# Patient Record
Sex: Female | Born: 1965 | Race: Black or African American | Hispanic: No | Marital: Married | Smoking: Never smoker
Health system: Southern US, Community
[De-identification: ages and names within clinical notes are randomized; demographics above are authoritative.]

## PROBLEM LIST (undated history)

## (undated) DIAGNOSIS — G8929 Other chronic pain: Secondary | ICD-10-CM

## (undated) DIAGNOSIS — R0683 Snoring: Secondary | ICD-10-CM

## (undated) DIAGNOSIS — R51 Headache: Secondary | ICD-10-CM

## (undated) DIAGNOSIS — M5430 Sciatica, unspecified side: Secondary | ICD-10-CM

## (undated) DIAGNOSIS — M1711 Unilateral primary osteoarthritis, right knee: Secondary | ICD-10-CM

## (undated) DIAGNOSIS — H579 Unspecified disorder of eye and adnexa: Secondary | ICD-10-CM

## (undated) DIAGNOSIS — M719 Bursopathy, unspecified: Secondary | ICD-10-CM

## (undated) DIAGNOSIS — T148XXA Other injury of unspecified body region, initial encounter: Secondary | ICD-10-CM

## (undated) DIAGNOSIS — M1990 Unspecified osteoarthritis, unspecified site: Secondary | ICD-10-CM

## (undated) DIAGNOSIS — M779 Enthesopathy, unspecified: Secondary | ICD-10-CM

## (undated) DIAGNOSIS — M797 Fibromyalgia: Secondary | ICD-10-CM

## (undated) DIAGNOSIS — G4700 Insomnia, unspecified: Secondary | ICD-10-CM

## (undated) DIAGNOSIS — F329 Major depressive disorder, single episode, unspecified: Secondary | ICD-10-CM

## (undated) DIAGNOSIS — M5136 Other intervertebral disc degeneration, lumbar region: Secondary | ICD-10-CM

## (undated) DIAGNOSIS — E119 Type 2 diabetes mellitus without complications: Secondary | ICD-10-CM

## (undated) DIAGNOSIS — M51369 Other intervertebral disc degeneration, lumbar region without mention of lumbar back pain or lower extremity pain: Secondary | ICD-10-CM

## (undated) DIAGNOSIS — R519 Headache, unspecified: Secondary | ICD-10-CM

## (undated) DIAGNOSIS — G5600 Carpal tunnel syndrome, unspecified upper limb: Secondary | ICD-10-CM

## (undated) DIAGNOSIS — F32A Depression, unspecified: Secondary | ICD-10-CM

## (undated) DIAGNOSIS — S0300XA Dislocation of jaw, unspecified side, initial encounter: Secondary | ICD-10-CM

## (undated) DIAGNOSIS — K5900 Constipation, unspecified: Secondary | ICD-10-CM

## (undated) DIAGNOSIS — F411 Generalized anxiety disorder: Secondary | ICD-10-CM

## (undated) HISTORY — PX: TEMPOROMANDIBULAR JOINT SURGERY: SHX35

## (undated) HISTORY — DX: Bursopathy, unspecified: M71.9

## (undated) HISTORY — DX: Headache: R51

## (undated) HISTORY — DX: Other injury of unspecified body region, initial encounter: T14.8XXA

## (undated) HISTORY — PX: TONSILLECTOMY: SUR1361

## (undated) HISTORY — DX: Unspecified disorder of eye and adnexa: H57.9

## (undated) HISTORY — DX: Dislocation of jaw, unspecified side, initial encounter: S03.00XA

## (undated) HISTORY — DX: Other chronic pain: G89.29

## (undated) HISTORY — PX: ABDOMINAL HYSTERECTOMY: SHX81

## (undated) HISTORY — DX: Major depressive disorder, single episode, unspecified: F32.9

## (undated) HISTORY — DX: Unspecified osteoarthritis, unspecified site: M19.90

## (undated) HISTORY — DX: Other intervertebral disc degeneration, lumbar region without mention of lumbar back pain or lower extremity pain: M51.369

## (undated) HISTORY — DX: Other intervertebral disc degeneration, lumbar region: M51.36

## (undated) HISTORY — DX: Generalized anxiety disorder: F41.1

## (undated) HISTORY — DX: Enthesopathy, unspecified: M77.9

## (undated) HISTORY — DX: Carpal tunnel syndrome, unspecified upper limb: G56.00

## (undated) HISTORY — DX: Snoring: R06.83

## (undated) HISTORY — DX: Type 2 diabetes mellitus without complications: E11.9

## (undated) HISTORY — PX: KNEE SURGERY: SHX244

## (undated) HISTORY — DX: Depression, unspecified: F32.A

## (undated) HISTORY — DX: Insomnia, unspecified: G47.00

## (undated) HISTORY — DX: Headache, unspecified: R51.9

## (undated) HISTORY — PX: DENTAL SURGERY: SHX609

## (undated) HISTORY — DX: Unilateral primary osteoarthritis, right knee: M17.11

## (undated) HISTORY — DX: Sciatica, unspecified side: M54.30

## (undated) HISTORY — PX: OTHER SURGICAL HISTORY: SHX169

## (undated) HISTORY — DX: Constipation, unspecified: K59.00

---

## 1999-01-25 ENCOUNTER — Encounter: Payer: Self-pay | Admitting: Orthopedic Surgery

## 1999-01-25 ENCOUNTER — Ambulatory Visit (HOSPITAL_COMMUNITY): Admission: RE | Admit: 1999-01-25 | Discharge: 1999-01-25 | Payer: Self-pay | Admitting: Orthopedic Surgery

## 1999-06-03 ENCOUNTER — Emergency Department (HOSPITAL_COMMUNITY): Admission: EM | Admit: 1999-06-03 | Discharge: 1999-06-04 | Payer: Self-pay | Admitting: *Deleted

## 1999-06-04 ENCOUNTER — Encounter: Payer: Self-pay | Admitting: Emergency Medicine

## 1999-08-19 ENCOUNTER — Emergency Department (HOSPITAL_COMMUNITY): Admission: EM | Admit: 1999-08-19 | Discharge: 1999-08-19 | Payer: Self-pay | Admitting: Emergency Medicine

## 1999-08-19 ENCOUNTER — Encounter: Payer: Self-pay | Admitting: Emergency Medicine

## 1999-08-29 ENCOUNTER — Emergency Department (HOSPITAL_COMMUNITY): Admission: EM | Admit: 1999-08-29 | Discharge: 1999-08-29 | Payer: Self-pay | Admitting: Emergency Medicine

## 2001-06-16 ENCOUNTER — Encounter: Payer: Self-pay | Admitting: Internal Medicine

## 2001-06-16 ENCOUNTER — Encounter: Admission: RE | Admit: 2001-06-16 | Discharge: 2001-06-16 | Payer: Self-pay | Admitting: Internal Medicine

## 2001-06-16 ENCOUNTER — Other Ambulatory Visit: Admission: RE | Admit: 2001-06-16 | Discharge: 2001-06-16 | Payer: Self-pay | Admitting: Internal Medicine

## 2001-07-04 ENCOUNTER — Encounter: Admission: RE | Admit: 2001-07-04 | Discharge: 2001-10-02 | Payer: Self-pay | Admitting: Internal Medicine

## 2001-10-10 ENCOUNTER — Encounter: Admission: RE | Admit: 2001-10-10 | Discharge: 2002-01-08 | Payer: Self-pay | Admitting: Internal Medicine

## 2003-07-17 ENCOUNTER — Other Ambulatory Visit: Admission: RE | Admit: 2003-07-17 | Discharge: 2003-07-17 | Payer: Self-pay | Admitting: Obstetrics and Gynecology

## 2003-08-03 ENCOUNTER — Encounter: Admission: RE | Admit: 2003-08-03 | Discharge: 2003-08-03 | Payer: Self-pay | Admitting: Obstetrics and Gynecology

## 2008-06-12 ENCOUNTER — Inpatient Hospital Stay (HOSPITAL_COMMUNITY): Admission: EM | Admit: 2008-06-12 | Discharge: 2008-06-14 | Payer: Self-pay

## 2008-06-12 ENCOUNTER — Ambulatory Visit: Payer: Self-pay | Admitting: Cardiology

## 2008-06-12 ENCOUNTER — Encounter: Payer: Self-pay | Admitting: Emergency Medicine

## 2009-03-01 ENCOUNTER — Encounter: Admission: RE | Admit: 2009-03-01 | Discharge: 2009-03-01 | Payer: Self-pay | Admitting: Family Medicine

## 2010-08-05 LAB — CARDIAC PANEL(CRET KIN+CKTOT+MB+TROPI)
CK, MB: 0.6 ng/mL (ref 0.3–4.0)
CK, MB: 0.8 ng/mL (ref 0.3–4.0)
CK, MB: 0.8 ng/mL (ref 0.3–4.0)
Relative Index: 0.5 (ref 0.0–2.5)
Relative Index: 0.5 (ref 0.0–2.5)
Relative Index: 0.7 (ref 0.0–2.5)
Total CK: 115 U/L (ref 7–177)
Total CK: 126 U/L (ref 7–177)
Total CK: 153 U/L (ref 7–177)
Troponin I: <0.01 ng/mL (ref 0.00–0.06)
Troponin I: <0.01 ng/mL (ref 0.00–0.06)
Troponin I: <0.01 ng/mL (ref 0.00–0.06)

## 2010-08-05 LAB — CBC
HCT: 34.6 % — ABNORMAL LOW (ref 36.0–46.0)
HCT: 39.2 % (ref 36.0–46.0)
Hemoglobin: 12 g/dL (ref 12.0–15.0)
Hemoglobin: 13.2 g/dL (ref 12.0–15.0)
MCHC: 33.6 g/dL (ref 30.0–36.0)
MCHC: 34.6 g/dL (ref 30.0–36.0)
MCV: 87 fL (ref 78.0–100.0)
MCV: 87 fL (ref 78.0–100.0)
Platelets: 210 10*3/uL (ref 150–400)
Platelets: 227 10*3/uL (ref 150–400)
RBC: 3.98 MIL/uL (ref 3.87–5.11)
RBC: 4.5 MIL/uL (ref 3.87–5.11)
RDW: 14.1 % (ref 11.5–15.5)
RDW: 14.3 % (ref 11.5–15.5)
WBC: 7.1 10*3/uL (ref 4.0–10.5)
WBC: 9 10*3/uL (ref 4.0–10.5)

## 2010-08-05 LAB — LIPID PANEL
Cholesterol: 176 mg/dL (ref 0–200)
HDL: 42 mg/dL (ref >39)
LDL Cholesterol: 113 mg/dL — ABNORMAL HIGH (ref 0–99)
Total CHOL/HDL Ratio: 4.2 RATIO
Triglycerides: 107 mg/dL (ref <150)
VLDL: 21 mg/dL (ref 0–40)

## 2010-08-05 LAB — POCT I-STAT, CHEM 8
BUN: 11 mg/dL (ref 6–23)
Calcium, Ion: 1.18 mmol/L (ref 1.12–1.32)
Chloride: 106 mEq/L (ref 96–112)
Creatinine, Ser: 1.1 mg/dL (ref 0.4–1.2)
Glucose, Bld: 121 mg/dL — ABNORMAL HIGH (ref 70–99)
HCT: 41 % (ref 36.0–46.0)
Hemoglobin: 13.9 g/dL (ref 12.0–15.0)
Potassium: 3.5 mEq/L (ref 3.5–5.1)
Sodium: 140 mEq/L (ref 135–145)
TCO2: 25 mmol/L (ref 0–100)

## 2010-08-05 LAB — BASIC METABOLIC PANEL
BUN: 11 mg/dL (ref 6–23)
CO2: 26 mEq/L (ref 19–32)
Calcium: 8.9 mg/dL (ref 8.4–10.5)
Chloride: 105 mEq/L (ref 96–112)
Creatinine, Ser: 0.93 mg/dL (ref 0.4–1.2)
GFR calc Af Amer: >60 mL/min (ref >60)
GFR calc non Af Amer: >60 mL/min (ref >60)
Glucose, Bld: 107 mg/dL — ABNORMAL HIGH (ref 70–99)
Potassium: 3.7 mEq/L (ref 3.5–5.1)
Sodium: 138 mEq/L (ref 135–145)

## 2010-08-05 LAB — APTT: aPTT: 40 seconds — ABNORMAL HIGH (ref 24–37)

## 2010-08-05 LAB — CK TOTAL AND CKMB (NOT AT ARMC)
CK, MB: 0.8 ng/mL (ref 0.3–4.0)
Relative Index: 0.7 (ref 0.0–2.5)
Total CK: 119 U/L (ref 7–177)

## 2010-08-05 LAB — PROTIME-INR
INR: 1 (ref 0.00–1.49)
Prothrombin Time: 13.7 seconds (ref 11.6–15.2)

## 2010-08-05 LAB — DIFFERENTIAL
Basophils Absolute: 0 10*3/uL (ref 0.0–0.1)
Basophils Relative: 0 % (ref 0–1)
Eosinophils Absolute: 0.2 10*3/uL (ref 0.0–0.7)
Eosinophils Relative: 2 % (ref 0–5)
Lymphocytes Relative: 31 % (ref 12–46)
Lymphs Abs: 2.8 10*3/uL (ref 0.7–4.0)
Monocytes Absolute: 0.5 10*3/uL (ref 0.1–1.0)
Monocytes Relative: 6 % (ref 3–12)
Neutro Abs: 5.5 10*3/uL (ref 1.7–7.7)
Neutrophils Relative %: 61 % (ref 43–77)

## 2010-08-05 LAB — TSH
TSH: 0.344 u[IU]/mL — ABNORMAL LOW (ref 0.350–4.500)
TSH: 2.723 u[IU]/mL (ref 0.350–4.500)

## 2010-08-05 LAB — POCT CARDIAC MARKERS
CKMB, poc: 1.1 ng/mL (ref 1.0–8.0)
CKMB, poc: 2.3 ng/mL (ref 1.0–8.0)
Myoglobin, poc: 49.4 ng/mL (ref 12–200)
Myoglobin, poc: 63.3 ng/mL (ref 12–200)
Troponin i, poc: 0.07 ng/mL (ref 0.00–0.09)
Troponin i, poc: <0.05 ng/mL (ref 0.00–0.09)

## 2010-08-05 LAB — TROPONIN I: Troponin I: 0.01 ng/mL (ref 0.00–0.06)

## 2010-08-05 LAB — D-DIMER, QUANTITATIVE (NOT AT ARMC): D-Dimer, Quant: <0.22 ug/mL-FEU (ref 0.00–0.48)

## 2010-08-05 LAB — D-DIMER, QUANTITATIVE: D-Dimer, Quant: 0.23 ug/mL-FEU (ref 0.00–0.48)

## 2010-09-02 NOTE — H&P (Signed)
NAMEKENLYN, [name]                ACCOUNT NO.:  [id number]   MEDICAL RECORD NO.:  [id number]          PATIENT TYPE:  EMS   LOCATION:  ED                           FACILITY:  [facility]   PHYSICIAN:  Della Goo, M.D. DATE OF BIRTH:  01-08-1966   DATE OF ADMISSION:  06/11/2008  DATE OF DISCHARGE:                              HISTORY & PHYSICAL   PRIMARY CARE PHYSICIAN:  Unassigned, Dr. Jeannetta Nap.   CHIEF COMPLAINT:  Chest pain.   HISTORY OF PRESENT ILLNESS:  This is a 45 year old female who presented  to the emergency department with complaints of severe chest pain which  is substernal in location which radiates into the left arm and into the  jaw.  The pain started 2 days ago and has been coming and going.  She  reports the pain is associated with shortness of breath.  She denies  having any nausea, vomiting or diaphoresis associated with the  discomfort.  She states the pain at the worst is an 8/10, and the pain  does last 15-20 minutes at a time.  The patient states that the pain has  not been associated with exertion; however, it is worse with movement.  She denies having any previous similar episodes to this.  She denies  having any fevers, chills or congestion symptoms.  The patient does have  a history of fibromyalgia, but denies that this feels like her  fibromyalgia pain.  The pain this evening prior to arrival was  unrelieved until she was administered sublingual nitroglycerin x1 dose.   PAST MEDICAL HISTORY:  1. Fibromyalgia.  2. History of phlebitis.   PAST SURGICAL HISTORY:  1. Tonsillectomy and adenoidectomy.  2. Right knee arthroscopic surgery.  3. C-sections x3.  4. Cyst removal of hand.  5. Three temporomandibular joint surgeries.   MEDICATIONS AT THIS TIME:  None.   ALLERGIES:  PENICILLIN, which causes swelling.   SOCIAL HISTORY:  The patient is married.  She is a nonsmoker and  nondrinker.   FAMILY HISTORY:  Negative for coronary artery disease and  diabetes.  Positive for hypertension in both parents and positive for cancer in her  maternal grandparents.  Her maternal grandmother had breast cancer.  Her  maternal grandfather had lung cancer and was a smoker.   REVIEW OF SYSTEMS:  Pertinents are mentioned above.  All other organ  systems are negative.   PHYSICAL EXAMINATION:  GENERAL:  This is a 45 year old mildly  overweight, well-developed female in discomfort but no acute distress.  VITAL SIGNS:  Temperature 98.1, blood pressure 153/103, heart rate 85,  respirations 20.  O2 saturations are 100%.  HEENT:  Normocephalic, atraumatic.  There is no scleral icterus.  Pupils  are equally round and reactive to light.  Extraocular movements are  intact.  Funduscopic benign.  Nares are patent bilaterally.  Oropharynx  is clear.  NECK:  Supple, full range of motion.  No thyromegaly, adenopathy or  jugulovenous distention.  CARDIOVASCULAR:  Regular rate and rhythm.  Normal S1-S2.  No murmurs, gallops or rubs appreciated.  Chest wall  without palpable tenderness or reproducible  pain.  LUNGS:  Clear to auscultation bilaterally.  Normal excursion  bilaterally.  No rales, rhonchi or wheezes ABDOMEN:  Positive bowel  sounds.  Soft, nontender, nondistended.  No hepatosplenomegaly.  BACK:  No spinous process tenderness.  No costovertebral angle  tenderness.  NEUROLOGIC:  Alert and oriented x3.  There are no focal deficits.   LABORATORY STUDIES:  White blood cell count 9.0, hemoglobin 13.2,  hematocrit 39.2, MCV is 7.0, platelets 227, neutrophils 61%, lymphocytes  31%.  D-dimer is 0.23.  Point of care cardiac markers with a myoglobin  of 63.3, CK-MB 2.3, troponin 0.07.  Chest x-ray findings revealed no  acute disease process.  EKG reveals a normal sinus rhythm without acute  ST-segment changes.   ASSESSMENT:  A 45 year old female being admitted with;  1. Chest pain.  2. Elevated blood pressure, but no previous history of hypertension.  3.  Fibromyalgia.  4. History of thrombophlebitis.   PLAN:  The patient will be admitted to a telemetry bed for cardiac  monitoring.  Cardiac enzymes will be performed.  The patient will be  placed on Nitropaste, oxygen and aspirin therapy at this time.  So far  her enzymes are negative, however, a complete set of cardiac markers  will be ordered q.8 h.  The patient will be placed on DVT and GI  prophylaxis at this time and further workup will ensue pending results  of the patient's clinical course and her clinical studies.      Della Goo, M.D.  Electronically Signed     HJ/MEDQ  D:  06/12/2008  T:  06/12/2008  Job:  [id number]

## 2010-09-02 NOTE — Discharge Summary (Signed)
[name], [name]                ACCOUNT NO.:  [id number]   MEDICAL RECORD NO.:  [id number]          PATIENT TYPE:  INP   LOCATION:  5501                         FACILITY:  [facility]   PHYSICIAN:  Isidor Holts, M.D.  DATE OF BIRTH:  [birth year]   DATE OF ADMISSION:  06/12/2008  DATE OF DISCHARGE:  06/14/2008                               DISCHARGE SUMMARY   PRIMARY MEDICAL DOCTOR:  Windle Guard, M.D.   DISCHARGE DIAGNOSES:  1. Atypical chest pain, likely musculoskeletal.  2. Fibromyalgia syndrome.   DISCHARGE MEDICATIONS:  1. Motrin 600 mg p.o. t.i.d. with food for 1 week only.  2. Prilosec OTC 20 mg p.o. daily for 1 week only.   PROCEDURES:  1. Chest x-ray dated June 12, 2008.  This showed no acute      cardiopulmonary process.  2. Stress Myoview dated June 13, 2008.  This was an adenosine      Myoview with no diagnostic electrocardiographic changes.  The      scintigraphic results show mild breast attenuation but there is no      ischemia in the study.  The gated ejection fraction was 58% and      wall motion was normal.   CONSULTATIONS:  Dr. Olga Millers, cardiologist.   ADMISSION HISTORY:  As per H and P notes of June 11, 2008, dictated  by Dr. Della Goo.  However, in brief, this is a 45 year old  female, with known history of fibromyalgia syndrome, previous history of  phlebitis, history of tonsillectomy/adenoidectomy, history of right knee  arthroscopic surgery, previous cesarean sections, status post cystectomy  of the hand, status post 3 temporomandibular joint surgeries, presenting  with substernal chest pain radiating into the left arm and to the jaw,  of approximately 2 days' duration, associated with shortness of breath  without nausea, vomiting or diaphoresis.  She was admitted further  evaluation, investigation and management.   CLINICAL COURSE:  1. Chest pain.  For details of presentation, refer to admission      history above.  A  12-lead EKG showed no acute ischemic changes.      Cardiac enzymes were cycled and remained unelevated.  D-dimer was      done and was found to be normal at less than 0.22.  Chest x-ray was      devoid of acute pathology.  Cardiology consultation was called,      which was kindly provided by Dr. Olga Millers.  For details of      that consultation, refer to consultation notes of June 12, 2008.  He arranged a stress Myoview which was carried out on      June 13, 2008, showed no evidence of ischemia and also      demonstrated a normal ejection fraction of 58%.  The patient has      been reassured accordingly.  Physical examination did however      reveal localized chest wall soreness to palpation, likely chest      pain was musculoskeletal in the origin.  The patient was therefore  managed with scheduled NSAID therapy, with resolution of symptoms.      She is anticipated to continue a further 1-week course of this      treatment.   1. Fibromyalgia syndrome.  There were no problems referable to this.   DISPOSITION:  The patient did have a somewhat elevated blood pressure at  153/103 at the time of initial presentation.  She has no previous known  history of hypertension.  This was felt attributable to pain and  anxiety.  She was monitored and during the course of the rest of the  hospitalization remained normotensive.  As a matter of fact, on June 14, 2008, blood pressure was normal at 108/70 mmHg.  The patient has  been encouraged to lose weight.  Of note, her lipid profile during this  hospitalization was as follows:  Total cholesterol 176, triglycerides  107, HDL 42, LDL 113.  This is considered reasonable.  The patient was  asymptomatic on June 14, 2008.  She was considered clinically stable  for discharge, and was therefore discharged accordingly.   DIET:  Heart-healthy.   ACTIVITY:  As tolerated.   FOLLOWUP INSTRUCTIONS:  The patient is to follow up  routinely with her  primary MD, Dr. Windle Guard per prior scheduled appointment.  She has  been instructed to call for an appointment.      Isidor Holts, M.D.  Electronically Signed     CO/MEDQ  D:  06/14/2008  T:  06/14/2008  Job:  [id number]   cc:   Windle Guard, M.D.

## 2010-09-02 NOTE — Consult Note (Signed)
NAMEKIMYETTA, [name]                ACCOUNT NO.:  [id number]   MEDICAL RECORD NO.:  [id number]          PATIENT TYPE:  INP   LOCATION:  5501                         FACILITY:  [facility]   PHYSICIAN:  Madolyn Frieze. Jens Som, MD, FACCDATE OF BIRTH:  [birth year]   DATE OF CONSULTATION:  06/12/2008  DATE OF DISCHARGE:                                 CONSULTATION   The patient is a 45 year old female with a past medical history of  fibromyalgia and migraine headaches whom I am asked to evaluate for  chest pain.  She has no prior cardiac history.  Approximately 8 years  ago, she apparently had chest pain and was told that it was  musculoskeletal in etiology.  She began having chest pain on February  2010.  The pain is in the substernal area and radiates down her left  upper extremity.  It is described as a pressure.  It is not pleuritic.  It does not change with lying flat.  It is not exertional.  It is not  related to food.  She does state that there is some nausea and shortness  of breath, but there is no diaphoresis.  The pain lasts anywhere from 45  minutes to an hour.  It is relieved with pain medications.  Because of  her chest pain, she was admitted on February 23.  Her enzymes have been  negative and her electrocardiogram is normal.  Cardiology is now asked  to further evaluate.  Note, she otherwise, has not had dyspnea on  exertion or orthopnea.  There is no pedal edema.   MEDICATIONS AT PRESENT:  1. Aspirin 325 mg p.o. daily.  2. Enoxaparin 40 mg subcu daily.  3. Nitroglycerin paste.  4. Protonix 40 mg IV daily.  5. Senokot.  6. Tylenol.   She has an allergy to PENICILLIN.   SOCIAL HISTORY:  She does not smoke nor she consumes alcohol.  She is  married with 3 children.  She works at a day care center.   PAST MEDICAL HISTORY:  There is no diabetes mellitus, hypertension, or  hyperlipidemia.  She has had previous goiter.  She also has  fibromyalgia.  She has a history of tonsillectomy  as well as 3 surgeries  for TMJ.  She has had previous cesarean sections x3 and a hysterectomy.  She has also had previous knee surgery.   FAMILY HISTORY:  Negative for coronary artery disease.   REVIEW OF SYSTEMS:  She denies any headaches, fevers, or chills at  present.  There is no productive cough or hemoptysis.  There is no  dysphagia, odynophagia, melena, or hematochezia.  There is no dysuria or  hematuria.  There is no rash or seizure activity.  There is no  orthopnea, PND, or pedal edema.  Remaining systems are negative.   PHYSICAL EXAMINATION:  VITAL SIGNS:  Today shows a blood pressure of  105/62 and her is pulse 72.  Her temperature is 97.6.  She is 95% on 1.5  liters.  GENERAL:  She is well developed and mildly obese.  She is no acute  distress  at present.  She does not appear to be depressed.  SKIN:  Warm and dry.  There is no peripheral clubbing.  BACK:  Normal.  HEENT:  Normal with normal eyelids.  NECK:  Supple with normal upstroke bilaterally.  No bruits heard.  There  is no jugular distention and I cannot appreciate thyromegaly.  CHEST:  Clear to auscultation.  Normal expansion.  CARDIOVASCULAR:  Regular rhythm.  Normal S1 and S2.  There are no  murmurs, rubs, or gallops noted.  Note, she is tender over the chest  wall and states that it does reproduce her symptoms.  ABDOMEN:  Nontender and nondistended.  Positive bowel sounds.  No  hepatosplenomegaly.  No mass appreciated.  There is no abdominal bruit.  EXTREMITIES;  She is 2+ femoral pulses bilaterally.  No bruits.  No  edema.  I could palpate no cords.  She has 2+ dorsalis pedis pulses  bilaterally.  NEUROLOGIC:  Grossly intact.   LABORATORY DATA:  Her cardiac markers have been negative x3 thus far.  Her D-dimer is normal at 0.23.  Her hemoglobin and hematocrit are 13.2  and 39.2 respectively.  Her white blood cell count is 90 with a platelet  count of 227.  Her BUN and creatinine are 11 and 1.1  respectively.  Chest x-ray shows no acute cardiopulmonary process.  Her  electrocardiogram shows a sinus rhythm at a rate of 86.  The axis is  normal.  There are no ST changes noted.   DIAGNOSES:  1. Chest pain - the patient's symptoms are atypical.  There maybe      musculoskeletal etiology that are reproduced with palpation.  She      will continue to have enzyme cycle.  If they are negative, then we      will plan to proceed with an outpatient Myoview tomorrow.  I agree      with aspirin at this point.  2. Fibromyalgia - management per the primary care service.   We will be happy to follow while she is in the hospital.      Madolyn Frieze. Jens Som, MD, [facility]  Electronically Signed     BSC/MEDQ  D:  06/12/2008  T:  06/13/2008  Job:  [id number]

## 2011-07-22 ENCOUNTER — Other Ambulatory Visit: Payer: Self-pay | Admitting: Family Medicine

## 2011-07-23 ENCOUNTER — Other Ambulatory Visit: Payer: Self-pay | Admitting: Family Medicine

## 2011-07-23 DIAGNOSIS — N644 Mastodynia: Secondary | ICD-10-CM

## 2011-07-28 ENCOUNTER — Ambulatory Visit
Admission: RE | Admit: 2011-07-28 | Discharge: 2011-07-28 | Disposition: A | Discharge status: Home / Self Care | Payer: 59 | Source: Ambulatory Visit | Attending: Family Medicine | Admitting: Family Medicine

## 2011-07-28 DIAGNOSIS — N644 Mastodynia: Secondary | ICD-10-CM

## 2012-10-25 ENCOUNTER — Other Ambulatory Visit: Payer: Self-pay

## 2012-10-25 DIAGNOSIS — Z1231 Encounter for screening mammogram for malignant neoplasm of breast: Secondary | ICD-10-CM

## 2012-12-13 ENCOUNTER — Ambulatory Visit
Admission: RE | Admit: 2012-12-13 | Discharge: 2012-12-13 | Disposition: A | Discharge status: Home / Self Care | Payer: 59 | Source: Ambulatory Visit

## 2012-12-13 DIAGNOSIS — Z1231 Encounter for screening mammogram for malignant neoplasm of breast: Secondary | ICD-10-CM

## 2014-05-21 ENCOUNTER — Other Ambulatory Visit: Payer: Self-pay

## 2014-05-21 DIAGNOSIS — Z1231 Encounter for screening mammogram for malignant neoplasm of breast: Secondary | ICD-10-CM

## 2014-06-08 ENCOUNTER — Ambulatory Visit: Payer: Self-pay

## 2015-02-11 ENCOUNTER — Institutional Professional Consult (permissible substitution): Payer: BLUE CROSS/BLUE SHIELD | Admitting: Neurology

## 2015-02-24 ENCOUNTER — Emergency Department (HOSPITAL_COMMUNITY)
Admission: EM | Admit: 2015-02-24 | Discharge: 2015-02-24 | Disposition: A | Discharge status: Home / Self Care | Payer: BLUE CROSS/BLUE SHIELD | Attending: Emergency Medicine | Admitting: Emergency Medicine

## 2015-02-24 ENCOUNTER — Encounter (HOSPITAL_COMMUNITY): Payer: Self-pay | Admitting: Emergency Medicine

## 2015-02-24 ENCOUNTER — Emergency Department (HOSPITAL_COMMUNITY): Payer: BLUE CROSS/BLUE SHIELD

## 2015-02-24 DIAGNOSIS — Z8659 Personal history of other mental and behavioral disorders: Secondary | ICD-10-CM | POA: Insufficient documentation

## 2015-02-24 DIAGNOSIS — R350 Frequency of micturition: Secondary | ICD-10-CM | POA: Diagnosis not present

## 2015-02-24 DIAGNOSIS — Z880 Allergy status to penicillin: Secondary | ICD-10-CM | POA: Diagnosis not present

## 2015-02-24 DIAGNOSIS — G8929 Other chronic pain: Secondary | ICD-10-CM | POA: Diagnosis not present

## 2015-02-24 DIAGNOSIS — K5900 Constipation, unspecified: Secondary | ICD-10-CM | POA: Diagnosis not present

## 2015-02-24 DIAGNOSIS — Z79899 Other long term (current) drug therapy: Secondary | ICD-10-CM | POA: Diagnosis not present

## 2015-02-24 DIAGNOSIS — M549 Dorsalgia, unspecified: Secondary | ICD-10-CM

## 2015-02-24 DIAGNOSIS — M25551 Pain in right hip: Secondary | ICD-10-CM | POA: Diagnosis not present

## 2015-02-24 DIAGNOSIS — M545 Low back pain: Secondary | ICD-10-CM | POA: Insufficient documentation

## 2015-02-24 HISTORY — DX: Fibromyalgia: M79.7

## 2015-02-24 LAB — URINALYSIS, ROUTINE W REFLEX MICROSCOPIC
Bilirubin Urine: NEGATIVE
Glucose, UA: NEGATIVE mg/dL
Hgb urine dipstick: NEGATIVE
Ketones, ur: NEGATIVE mg/dL
Leukocytes, UA: NEGATIVE
Nitrite: NEGATIVE
Protein, ur: NEGATIVE mg/dL
Specific Gravity, Urine: 1.008 (ref 1.005–1.030)
Urobilinogen, UA: 0.2 mg/dL (ref 0.0–1.0)
pH: 5 (ref 5.0–8.0)

## 2015-02-24 MED ORDER — OXYCODONE-ACETAMINOPHEN 5-325 MG PO TABS
2.0000 | ORAL_TABLET | Freq: Once | ORAL | Status: AC
  Administered 2015-02-24: 2 via ORAL
  Filled 2015-02-24: qty 2

## 2015-02-24 MED ORDER — OXYCODONE-ACETAMINOPHEN 5-325 MG PO TABS: 1.0000 | ORAL_TABLET | ORAL | Status: DC | PRN

## 2015-02-24 MED ORDER — IBUPROFEN 800 MG PO TABS
800.0000 mg | ORAL_TABLET | Freq: Three times a day (TID) | ORAL | Status: DC
Start: 2015-02-24 — End: 2015-06-11

## 2015-02-24 NOTE — ED Notes (Addendum)
Pt c/o low back that started 3 days ago. Pain radiates to r/hip. Denies numbness in r/leg. Pt also c/o constipation. Pain did not respond to Robaxin or Hydrocodone

## 2015-02-24 NOTE — Discharge Instructions (Signed)
1. Medications: ibuprofen, pain medication, usual home medications 2. Treatment: rest, drink plenty of fluids, ice 3. Follow Up: please followup with your primary doctor and with orthopedics for discussion of your diagnoses and further evaluation after today's visit; if you do not have a primary care doctor use the resource guide provided to find one; please return to the ER for severe pain, numbness, weakness, loss of control of your bowel or bladder, new or worsening symptoms   Back Exercises The following exercises strengthen the muscles that help to support the back. They also help to keep the lower back flexible. Doing these exercises can help to prevent back pain or lessen existing pain. If you have back pain or discomfort, try doing these exercises 2-3 times each day or as told by your health care provider. When the pain goes away, do them once each day, but increase the number of times that you repeat the steps for each exercise (do more repetitions). If you do not have back pain or discomfort, do these exercises once each day or as told by your health care provider. EXERCISES Single Knee to Chest Repeat these steps 3-5 times for each leg:  Lie on your back on a firm bed or the floor with your legs extended.  Bring one knee to your chest. Your other leg should stay extended and in contact with the floor.  Hold your knee in place by grabbing your knee or thigh.  Pull on your knee until you feel a gentle stretch in your lower back.  Hold the stretch for 10-30 seconds.  Slowly release and straighten your leg. Pelvic Tilt Repeat these steps 5-10 times:  Lie on your back on a firm bed or the floor with your legs extended.  Bend your knees so they are pointing toward the ceiling and your feet are flat on the floor.  Tighten your lower abdominal muscles to press your lower back against the floor. This motion will tilt your pelvis so your tailbone points up toward the ceiling instead of  pointing to your feet or the floor.  With gentle tension and even breathing, hold this position for 5-10 seconds. Cat-Cow Repeat these steps until your lower back becomes more flexible:  Get into a hands-and-knees position on a firm surface. Keep your hands under your shoulders, and keep your knees under your hips. You may place padding under your knees for comfort.  Let your head hang down, and point your tailbone toward the floor so your lower back becomes rounded like the back of a cat.  Hold this position for 5 seconds.  Slowly lift your head and point your tailbone up toward the ceiling so your back forms a sagging arch like the back of a cow.  Hold this position for 5 seconds. Press-Ups Repeat these steps 5-10 times:  Lie on your abdomen (face-down) on the floor.  Place your palms near your head, about shoulder-width apart.  While you keep your back as relaxed as possible and keep your hips on the floor, slowly straighten your arms to raise the top half of your body and lift your shoulders. Do not use your back muscles to raise your upper torso. You may adjust the placement of your hands to make yourself more comfortable.  Hold this position for 5 seconds while you keep your back relaxed.  Slowly return to lying flat on the floor. Bridges Repeat these steps 10 times: 1. Lie on your back on a firm surface. 2. [place]  your knees so they are pointing toward the ceiling and your feet are flat on the floor. 3. Tighten your buttocks muscles and lift your buttocks off of the floor until your waist is at almost the same height as your knees. You should feel the muscles working in your buttocks and the back of your thighs. If you do not feel these muscles, slide your feet 1-2 inches farther away from your buttocks. 4. Hold this position for 3-5 seconds. 5. Slowly lower your hips to the starting position, and allow your buttocks muscles to relax completely. If this exercise is too easy,  try doing it with your arms crossed over your chest. Abdominal Crunches Repeat these steps 5-10 times: 1. Lie on your back on a firm bed or the floor with your legs extended. 2. Bend your knees so they are pointing toward the ceiling and your feet are flat on the floor. 3. Cross your arms over your chest. 4. Tip your chin slightly toward your chest without bending your neck. 5. Tighten your abdominal muscles and slowly raise your trunk (torso) high enough to lift your shoulder blades a tiny bit off of the floor. Avoid raising your torso higher than that, because it can put too much stress on your low back and it does not help to strengthen your abdominal muscles. 6. Slowly return to your starting position. Back Lifts Repeat these steps 5-10 times: 1. Lie on your abdomen (face-down) with your arms at your sides, and rest your forehead on the floor. 2. Tighten the muscles in your legs and your buttocks. 3. Slowly lift your chest off of the floor while you keep your hips pressed to the floor. Keep the back of your head in line with the curve in your back. Your eyes should be looking at the floor. 4. Hold this position for 3-5 seconds. 5. Slowly return to your starting position. SEEK MEDICAL CARE IF:  Your back pain or discomfort gets much worse when you do an exercise.  Your back pain or discomfort does not lessen within 2 hours after you exercise. If you have any of these problems, stop doing these exercises right away. Do not do them again unless your health care provider says that you can. SEEK IMMEDIATE MEDICAL CARE IF:  You develop sudden, severe back pain. If this happens, stop doing the exercises right away. Do not do them again unless your health care provider says that you can.   This information is not intended to replace advice given to you by your health care provider. Make sure you discuss any questions you have with your health care provider.   Document Released: 05/14/2004  Document Revised: 12/26/2014 Document Reviewed: 05/31/2014 Elsevier Interactive Patient Education 2016 Elsevier Inc.  Back Pain, Adult Back pain is very common in adults.The cause of back pain is rarely dangerous and the pain often gets better over time.The cause of your back pain may not be known. Some common causes of back pain include:  Strain of the muscles or ligaments supporting the spine.  Wear and tear (degeneration) of the spinal disks.  Arthritis.  Direct injury to the back. For many people, back pain may return. Since back pain is rarely dangerous, most people can learn to manage this condition on their own. HOME CARE INSTRUCTIONS Watch your back pain for any changes. The following actions may help to lessen any discomfort you are feeling:  Remain active. It is stressful on your back to sit or stand in  one place for long periods of time. Do not sit, drive, or stand in one place for more than 30 minutes at a time. Take short walks on even surfaces as soon as you are able.Try to increase the length of time you walk each day.  Exercise regularly as directed by your health care provider. Exercise helps your back heal faster. It also helps avoid future injury by keeping your muscles strong and flexible.  Do not stay in bed.Resting more than 1-2 days can delay your recovery.  Pay attention to your body when you bend and lift. The most comfortable positions are those that put less stress on your recovering back. Always use proper lifting techniques, including:  Bending your knees.  Keeping the load close to your body.  Avoiding twisting.  Find a comfortable position to sleep. Use a firm mattress and lie on your side with your knees slightly bent. If you lie on your back, put a pillow under your knees.  Avoid feeling anxious or stressed.Stress increases muscle tension and can worsen back pain.It is important to recognize when you are anxious or stressed and learn ways to  manage it, such as with exercise.  Take medicines only as directed by your health care provider. Over-the-counter medicines to reduce pain and inflammation are often the most helpful.Your health care provider may prescribe muscle relaxant drugs.These medicines help dull your pain so you can more quickly return to your normal activities and healthy exercise.  Apply ice to the injured area:  Put ice in a plastic bag.  Place a towel between your skin and the bag.  Leave the ice on for 20 minutes, 2-3 times a day for the first 2-3 days. After that, ice and heat may be alternated to reduce pain and spasms.  Maintain a healthy weight. Excess weight puts extra stress on your back and makes it difficult to maintain good posture. SEEK MEDICAL CARE IF:  You have pain that is not relieved with rest or medicine.  You have increasing pain going down into the legs or buttocks.  You have pain that does not improve in one week.  You have night pain.  You lose weight.  You have a fever or chills. SEEK IMMEDIATE MEDICAL CARE IF:   You develop new bowel or bladder control problems.  You have unusual weakness or numbness in your arms or legs.  You develop nausea or vomiting.  You develop abdominal pain.  You feel faint.   This information is not intended to replace advice given to you by your health care provider. Make sure you discuss any questions you have with your health care provider.   Document Released: 04/06/2005 Document Revised: 04/27/2014 Document Reviewed: 08/08/2013 Elsevier Interactive Patient Education 2016 [employer].   Emergency Department Resource Guide 1) Find a Doctor and Pay Out of Pocket Although you won't have to find out who is covered by your insurance plan, it is a good idea to ask around and get recommendations. You will then need to call the office and see if the doctor you have chosen will accept you as a new patient and what types of options they offer for  patients who are self-pay. Some doctors offer discounts or will set up payment plans for their patients who do not have insurance, but you will need to ask so you aren't surprised when you get to your appointment.  2) Contact Your Local Health Department Not all health departments have doctors that can see patients  for sick visits, but many do, so it is worth a call to see if yours does. If you don't know where your local health department is, you can check in your phone book. The CDC also has a tool to help you locate your state's health department, and many state websites also have listings of all of their local health departments.  3) Find a [facility] If your illness is not likely to be very severe or complicated, you may want to try a walk in clinic. These are popping up all over the country in pharmacies, drugstores, and shopping centers. They're usually staffed by nurse practitioners or physician assistants that have been trained to treat common illnesses and complaints. They're usually fairly quick and inexpensive. However, if you have serious medical issues or chronic medical problems, these are probably not your best option.  No Primary Care Doctor: - Call Health Connect at  [phone] - they can help you locate a primary care doctor that  accepts your insurance, provides certain services, etc. - Physician Referral Service- [phone]  Chronic Pain Problems: Organization         Address  Phone   Notes  [facility]  [phone] Patients need to be referred by their primary care doctor.   Medication Assistance: Organization         Address  Phone   Notes  [facility] Medication [facility] [address] [place], [place] [address] [phone] --Must be a resident of [facility] -- Must have NO insurance coverage whatsoever (no Medicaid/ Medicare, etc.) -- The pt. MUST have a primary care doctor that directs their care regularly  and follows them in the community   MedAssist  [phone]   [employer]  [phone]    Agencies that provide inexpensive medical care: Organization         Address  Phone   Notes  [place]  [phone]   [facility]    [phone]   [facility] [place], [place] [address] [phone]   [place] [address] [place] [phone]   Planned Parenthood    [phone]   [facility]    [phone]   [place] and [place] [address], [place] Phone:  [phone], Fax:  [phone] Hours of Operation:  9 am - 6 pm, M-F.  Also accepts Medicaid/Medicare and self-pay.  [facility] for [place], [address], [place] Phone: [phone], Fax: [phone]. Hours of Operation:  8:30 am - 5:30 pm, M-F.  Also accepts Medicaid and self-pay.  [facility] [address], [place] Phone: [phone]   [place], [place], [place] [phone], Ext. [phone] Mondays & Thursdays: 7-9 AM.  First 15 patients are seen on a first come, first serve basis.    [place] Providers:  Organization         Address  Phone   Notes  [facility] [address],  [phone] Also accepts self-pay patients.  [address], [place]  [phone]   [place], [address], [place] [phone]   [facility] [address] [place] [phone]  Lucianne Lei [address], [place]   [phone] Only accepts [place] patients after they have their name applied to their card.   Self-Pay (no insurance) in [facility]:  Organization         Address  Phone   Notes  Sickle  Cell Patients, [facility] [place] [phone]   [facility]gent Care [place] [phone]   [facility] [place]  [place], [place], [place] [phone]   [facility]/Dr. Osei-Bonsu  [address] [place] or [address], [place] [phone] Phone number for both [place] and [place] locations is the same.  [facility] [address] [place] [phone]   [facility] [address] [place] or [address] [phone]   [facility] [address], [place] [phone], phone; [phone], fax Sees patients 1st and 3rd Saturday of every month.  Must not qualify for public or private insurance (i.e. Medicaid, Medicare, [place] Health Choice, Veterans' Benefits)  Household income should be no more than 200% of the poverty level The clinic cannot treat you if you are pregnant or think you are pregnant  Sexually transmitted diseases are not treated at the clinic.    Dental Care: Organization         Address  Phone  Notes  [facility] Department of [facility] [place] [phone] Accepts children up to age 66 who are enrolled in [place] or [place]; pregnant women with a Medicaid card; and children who have applied for Medicaid or [place] Health Choice, but were declined, whose parents can pay a reduced fee at time of service.  [facility] Department of [facility]  [address], [place] [phone] Accepts children up to age 64 who are enrolled in [place] or [place]; pregnant women with a Medicaid card; and children who have applied for Medicaid or [place] Health Choice, but were declined, whose parents can pay a reduced fee at time of service.  [place] Adult [facility]  Access PROGRAM  [place] [phone] Patients are seen by appointment only. Walk-ins are not accepted. [place] will see patients 83 years of age and older. Monday - Tuesday (8am-5pm) Most Wednesdays (8:30-5pm) $30 per visit, cash only  [facility] Adult Dental Access PROGRAM  [address], [facility] [phone] Patients are seen by appointment only. Walk-ins are not accepted. [place] will see patients 11 years of age and older. One Wednesday Evening (Monthly: Volunteer Based).  $30 per visit, cash only  [place]  [phone] for adults; Children under age 68, call Graduate Pediatric Dentistry at [phone]. Children aged 29-14, please call [phone] to request a pediatric application.  Dental services are provided in all areas of dental care including fillings, crowns and bridges, complete and partial dentures, implants, gum treatment, root canals, and extractions. Preventive care is also provided. Treatment is provided to both adults and children. Patients are selected via a lottery and there is often a waiting list.   [facility] [address] [place]  [phone] [email or web address], [place], [place] [phone], Ext.  123 Second and Fourth Thursday of each month, opens at 6:30 AM; Clinic ends at 9 AM.  Patients are seen on a first-come first-served basis, and a limited number are seen during each clinic.   [facility]  [address] [place], [place] [phone]   Eligibility Requirements You must have lived in [place], [place], or [place] counties for at least the last three months.   You cannot be eligible for state or federal sponsored [employer], including [employer], [place], or [employer].   You generally cannot be eligible for healthcare insurance through your employer.    How to apply: Eligibility  screenings are held every Tuesday and Wednesday afternoon from 1:00 pm until 4:00 pm. You do not need an appointment for the interview!  [facility] [address] [place], [place]   [place]  [employer] Department  [place]  [phone]    Behavioral Health Resources in the Community: Intensive Outpatient Programs Organization         Address  Phone  Notes  [place]. [address], [place], [place] [phone]   [facility]patient [address], [place], [place]   ADS: [facility] Drug [facility] [address] [place], [place]   [place] [address],  [place], [place] or [phone]   Substance Abuse Resources Organization         Address  Phone  Notes  Alcohol and Drug Services  [phone]   [place]  [phone]   The [place]   Chinita Pester  [phone]   Residential & Outpatient Substance Abuse Program  [phone]   Psychological Services Organization         Address  Phone  Notes  [facility]  [place]  [phone]   [place] [address] [place] or [phone]    Mobile Crisis Teams Organization         Address  Phone  Notes  [facility], Mobile Crisis Care Unit  [phone]   [facility]  [address] [place], [place]   [place] [address] [place] [phone]    Self-Help/Support Groups Organization         Address  Phone             Notes  [place]. of [place] - variety of support groups  [place] Call for more information  Narcotics Anonymous (NA), [facility] [address], [employer] [place]  2 meetings at  this location   Special educational needs teacher         Address  Phone  Notes  [facility] [place],    [place]  [phone]   [facility]  [address], [place] [address]47, [place], [place]   [place], [place] [phone] Admissions: 8am-3pm M-F  Incentives Substance [place] [address]    [place], [place] [phone]   [facility] [address] Jadene Pierini [place], [place]   [facility] [place].,  [place], [place] - Intensive Outpatient [address] Krist[address], [place], [place]   [facility] ([place]  [facility] [address]  [place], [place] [phone] or [phone]   [facility] Services (RTS) [address], [place], [place] Accepts Medicaid  [facility] [place] [address],  [place] [phone] Substance Abuse/Addiction Treatment   [facility] Organization         Address  Phone  Notes  [facility]  [phone]   Domenic Schwab, PhD [address] Arlis [place], [place]   [phone] or [phone]   [facility], [place] [phone]   [place] [address], [place], [place] [phone] Insurance/Medicaid/sponsorship through [facility] and Families [address] [place], [place]                                    [place], [place] [phone] [place] [address], [place] [phone]    Dr. Adele Schilder  [phone]   [facility] Dept. 1) [address], [place] 2) [place] 3)  [place] [address], [place] [phone]  [phone]   [place] [phone] or [phone] (After Hours)

## 2015-02-24 NOTE — ED Provider Notes (Signed)
CSN: [id number]     Arrival date & time 02/24/15  1024 History   First MD Initiated Contact with Patient 02/24/15 1103     Chief Complaint  Patient presents with  . Back Pain    pain in r/back and hip    HPI   [name] is a 49 y.o. female with a PMH of arthritis, fibromyalgia who presents to the ED with constant right sided low back and hip pain, which she states started Thursday. She reports movement exacerbates her pain. She has tried her home pain medication and muscle relaxant with no significant symptom relief. She denies fever, chills, chest pain, shortness of breath, abdominal pain, N/V/D. She reports constipation, which she states is unchanged from baseline. She denies numbness, weakness, paresthesia, bowel or bladder incontinence, saddle anesthesia, history of malignancy, IVDU, anticoagulant use. She denies recent injury, though states she has been doing work outside.   Past Medical History  Diagnosis Date  . Arthritis   . Chronic headaches   . Insomnia   . Depression   . Snoring   . TMJ (dislocation of temporomandibular joint)   . Fibromyalgia    Past Surgical History  Procedure Laterality Date  . Cyst from hand    . Abdominal hysterectomy    . Cesarean section    . Tonsillectomy    . Temporomandibular joint surgery    . Knee surgery      right   Family History  Problem Relation Age of Onset  . Hypertension Mother   . Diabetes Father   . Hypertension Father    Social History  Substance Use Topics  . Smoking status: Never Smoker   . Smokeless tobacco: None  . Alcohol Use: No   OB History    No data available      Review of Systems  Constitutional: Negative for fever and chills.  Respiratory: Negative for shortness of breath.   Cardiovascular: Negative for chest pain.  Gastrointestinal: Positive for constipation. Negative for nausea, vomiting, abdominal pain and diarrhea.  Genitourinary: Positive for frequency. Negative for dysuria and urgency.   Musculoskeletal: Positive for back pain and arthralgias.  Neurological: Negative for dizziness, weakness, light-headedness, numbness and headaches.      Allergies  Amoxicillin and Penicillins  Home Medications   Prior to Admission medications   Medication Sig Start Date End Date Taking? Authorizing Provider  HYDROcodone-acetaminophen (NORCO/VICODIN) 5-325 MG tablet Take 1 tablet by mouth every 6 (six) hours as needed for moderate pain.    Historical Provider, MD  ibuprofen (ADVIL,MOTRIN) 800 MG tablet Take 1 tablet (800 mg total) by mouth 3 (three) times daily. 02/24/15   Marella Chimes, PA-C  methocarbamol (ROBAXIN) 750 MG tablet Take 750 mg by mouth 4 (four) times daily.    Historical Provider, MD  nortriptyline (PAMELOR) 50 MG capsule Take 50 mg by mouth at bedtime.    Historical Provider, MD  oxyCODONE-acetaminophen (PERCOCET/ROXICET) 5-325 MG tablet Take 1 tablet by mouth every 4 (four) hours as needed for severe pain. 02/24/15   Marella Chimes, PA-C    BP 127/74 mmHg  Pulse 71  Temp(Src) 98.2 F (36.8 C) (Oral)  Resp 20  SpO2 98% Physical Exam  Constitutional: She is oriented to person, place, and time. She appears well-developed and well-nourished. No distress.  HENT:  Head: Normocephalic and atraumatic.  Right Ear: External ear normal.  Left Ear: External ear normal.  Nose: Nose normal.  Mouth/Throat: Uvula is midline, oropharynx is clear and moist  and mucous membranes are normal.  Eyes: Conjunctivae, EOM and lids are normal. Pupils are equal, round, and reactive to light. Right eye exhibits no discharge. Left eye exhibits no discharge. No scleral icterus.  Neck: Normal range of motion. Neck supple.  Cardiovascular: Normal rate, regular rhythm, normal heart sounds, intact distal pulses and normal pulses.   Pulmonary/Chest: Effort normal and breath sounds normal. No respiratory distress. She has no wheezes. She has no rales.  Abdominal: Soft. Normal appearance  and bowel sounds are normal. She exhibits no distension and no mass. There is no tenderness. There is no rigidity, no rebound and no guarding.  Musculoskeletal: Normal range of motion. She exhibits no edema or tenderness.  Mild TTP of right lumbar paraspinal muscles. No midline tenderness, step-off, or deformity. Mild TTP of right posterior and lateral hip with decreased range of motion due to pain. Strength and sensation intact. Distal pulses intact.  Neurological: She is alert and oriented to person, place, and time. She has normal strength and normal reflexes. No cranial nerve deficit or sensory deficit.  Patient able to ambulate, though this causes her pain.  Skin: Skin is warm, dry and intact. No rash noted. She is not diaphoretic. No erythema. No pallor.  Psychiatric: She has a normal mood and affect. Her speech is normal and behavior is normal.  Nursing note and vitals reviewed.   ED Course  Procedures (including critical care time)  Labs Review Labs Reviewed  URINALYSIS, ROUTINE W REFLEX MICROSCOPIC (NOT AT [facility]    Imaging Review Dg Hip Unilat With Pelvis 2-3 Views Right  02/24/2015  CLINICAL DATA:  49 year old female with pain in the posterior right hip for the past 3 days. EXAM: DG HIP (WITH OR WITHOUT PELVIS) 2-3V RIGHT COMPARISON:  No priors. FINDINGS: Two views of the right hip demonstrate no acute displaced fracture, subluxation or dislocation. There is joint space narrowing, subchondral sclerosis and osteophyte formation, compatible with mild moderate osteoarthritis. Visualized portions of the bony pelvis are otherwise unremarkable. IMPRESSION: 1. No acute radiographic abnormality of the right hip. Electronically Signed   By: Vinnie Langton M.D.   On: 02/24/2015 12:35     I have personally reviewed and evaluated these images and lab results as part of my medical decision-making.   EKG Interpretation None      MDM   Final diagnoses:  Right hip pain  Back pain,  unspecified location    49 year female presents with right-sided low back and hip pain, which she states started Thursday. Denies numbness, weakness, paresthesia, bowel or bladder incontinence, saddle anesthesia, history of malignancy, IVDU, anticoagulant use, recent injury. Reports urinary frequency.  Patient is afebrile. Vital signs stable. Mild TTP of right lumbar paraspinal muscles. No midline tenderness, step-off, or deformity. Mild TTP of right posterior and lateral hip with decreased range of motion due to pain. Distal pulses intact. Strength, sensation, DTRs intact.  Normal neuro exam with no focal deficit. Patient able to ambulate, though this causes her pain.  Will obtain imaging of right hip given tenderness on exam. Pain controlled in the ED. Patient reports significant symptom improvement. UA negative for infection.  Imaging negative for acute radiographic abnormality of the right hip; there is joint space narrowing, subchondral sclerosis, and osteophyte formation, compatible with osteoarthritis. Discussed findings with patient. Presentation not consistent with abscess, hematoma, cauda equina. Feel she is stable for discharge at this time. Will treat with short course of pain medication and anti-inflammatory. Patient to follow up with PCP  and with ortho for persistent symptoms. Return precautions discussed. Patient verbalizes her understanding and is in agreement with plan.  BP 127/74 mmHg  Pulse 71  Temp(Src) 98.2 F (36.8 C) (Oral)  Resp 20  SpO2 98%    Marella Chimes, PA-C 02/24/15 [place], MD 02/25/15 [phone]

## 2015-03-25 ENCOUNTER — Other Ambulatory Visit: Payer: Self-pay

## 2015-03-25 DIAGNOSIS — Z1231 Encounter for screening mammogram for malignant neoplasm of breast: Secondary | ICD-10-CM

## 2015-04-12 ENCOUNTER — Ambulatory Visit
Admission: RE | Admit: 2015-04-12 | Discharge: 2015-04-12 | Disposition: A | Discharge status: Home / Self Care | Payer: BLUE CROSS/BLUE SHIELD | Source: Ambulatory Visit

## 2015-04-12 DIAGNOSIS — Z1231 Encounter for screening mammogram for malignant neoplasm of breast: Secondary | ICD-10-CM

## 2015-06-11 ENCOUNTER — Encounter: Payer: Self-pay | Admitting: Family Medicine

## 2015-06-11 ENCOUNTER — Ambulatory Visit (INDEPENDENT_AMBULATORY_CARE_PROVIDER_SITE_OTHER): Payer: BLUE CROSS/BLUE SHIELD | Admitting: Family Medicine

## 2015-06-11 VITALS — BP 128/82 | HR 64 | Wt 233.4 lb

## 2015-06-11 DIAGNOSIS — Z8669 Personal history of other diseases of the nervous system and sense organs: Secondary | ICD-10-CM | POA: Diagnosis not present

## 2015-06-11 DIAGNOSIS — Z7189 Other specified counseling: Secondary | ICD-10-CM

## 2015-06-11 DIAGNOSIS — M179 Osteoarthritis of knee, unspecified: Secondary | ICD-10-CM

## 2015-06-11 DIAGNOSIS — Z87898 Personal history of other specified conditions: Secondary | ICD-10-CM

## 2015-06-11 DIAGNOSIS — Z7689 Persons encountering health services in other specified circumstances: Secondary | ICD-10-CM

## 2015-06-11 DIAGNOSIS — M797 Fibromyalgia: Secondary | ICD-10-CM

## 2015-06-11 DIAGNOSIS — R252 Cramp and spasm: Secondary | ICD-10-CM

## 2015-06-11 DIAGNOSIS — G894 Chronic pain syndrome: Secondary | ICD-10-CM | POA: Diagnosis not present

## 2015-06-11 DIAGNOSIS — G479 Sleep disorder, unspecified: Secondary | ICD-10-CM | POA: Diagnosis not present

## 2015-06-11 DIAGNOSIS — M1711 Unilateral primary osteoarthritis, right knee: Secondary | ICD-10-CM

## 2015-06-11 LAB — CBC WITH DIFFERENTIAL/PLATELET
Basophils Absolute: 0 10*3/uL (ref 0.0–0.1)
Basophils Relative: 0 % (ref 0–1)
Eosinophils Absolute: 0.1 10*3/uL (ref 0.0–0.7)
Eosinophils Relative: 1 % (ref 0–5)
HCT: 40 % (ref 36.0–46.0)
Hemoglobin: 13.1 g/dL (ref 12.0–15.0)
Lymphocytes Relative: 33 % (ref 12–46)
Lymphs Abs: 2.3 10*3/uL (ref 0.7–4.0)
MCH: 27.6 pg (ref 26.0–34.0)
MCHC: 32.8 g/dL (ref 30.0–36.0)
MCV: 84.2 fL (ref 78.0–100.0)
MPV: 10.8 fL (ref 8.6–12.4)
Monocytes Absolute: 0.3 10*3/uL (ref 0.1–1.0)
Monocytes Relative: 5 % (ref 3–12)
Neutro Abs: 4.2 10*3/uL (ref 1.7–7.7)
Neutrophils Relative %: 61 % (ref 43–77)
Platelets: 237 10*3/uL (ref 150–400)
RBC: 4.75 MIL/uL (ref 3.87–5.11)
RDW: 15.3 % (ref 11.5–15.5)
WBC: 6.9 10*3/uL (ref 4.0–10.5)

## 2015-06-11 NOTE — Patient Instructions (Signed)
Leg Cramps Leg cramps occur when a muscle or muscles tighten and you have no control over this tightening (involuntary muscle contraction). Muscle cramps can develop in any muscle, but the most common place is in the calf muscles of the leg. Those cramps can occur during exercise or when you are at rest. Leg cramps are painful, and they may last for a few seconds to a few minutes. Cramps may return several times before they finally stop. Usually, leg cramps are not caused by a serious medical problem. In many cases, the cause is not known. Some common causes include:  Overexertion.  Overuse from repetitive motions, or doing the same thing over and over.  Remaining in a certain position for a long period of time.  Improper preparation, form, or technique while performing a sport or an activity.  Dehydration.  Injury.  Side effects of some medicines.  Abnormally low levels of the salts and ions in your blood (electrolytes), especially potassium and calcium. These levels could be low if you are taking water pills (diuretics) or if you are pregnant. HOME CARE INSTRUCTIONS Watch your condition for any changes. Taking the following actions may help to lessen any discomfort that you are feeling:  Stay well-hydrated. Drink enough fluid to keep your urine clear or pale yellow.  Try massaging, stretching, and relaxing the affected muscle. Do this for several minutes at a time.  For tight or tense muscles, use a warm towel, heating pad, or hot shower water directed to the affected area.  If you are sore or have pain after a cramp, applying ice to the affected area may relieve discomfort.  Put ice in a plastic bag.  Place a towel between your skin and the bag.  Leave the ice on for 20 minutes, 2-3 times per day.  Avoid strenuous exercise for several days if you have been having frequent leg cramps.  Make sure that your diet includes the essential minerals for your muscles to work  normally.  Take medicines only as directed by your health care provider. SEEK MEDICAL CARE IF:  Your leg cramps get more severe or more frequent, or they do not improve over time.  Your foot becomes cold, numb, or blue.   This information is not intended to replace advice given to you by your health care provider. Make sure you discuss any questions you have with your health care provider.   Document Released: 05/14/2004 Document Revised: 08/21/2014 Document Reviewed: 03/14/2014 Elsevier Interactive Patient Education 2016 [employer]   

## 2015-06-11 NOTE — Progress Notes (Signed)
Subjective:    Patient ID: [name], female    DOB: 08-07-1965, 50 y.o.   MRN: [id number]  HPI Chief Complaint  Patient presents with  . new pt    new pt get establish. having trouble sleeping. cramping from right hip to foot and then left ankle cramping. on pain meds and muscle relaxers due to sciatic pain and fibromyalia which she sees Dr. Charlestine Night but does not give out meds   She is new to the practice and here to establish care. She has been going to [facility] for the past 20 years. States she has not been seen there since June or July 2016.  She has been seeing Dr. Charlestine Night for fibromyalgia and was diagnosed in 1996. She states he does not prescribe medication for fibromyalgia. She also reports history of osteoarthritis to right hip and right knee and bursitis to right hip.  Denies ever having an injury, states she just woke up one day and started having aches in pains. Has been taking hydrocodone, oxycontin for pain states she really does not want to be on these medications but doesn't know what else she can do. States these medications don't work as well as they used to for her pain.  Has seen pain management but states this was in the 1990s. Has also tried physical therapy in past per patient.  States at one point she was disabled but now she works.   States current medications are not helping with pain, and she is having difficulty sleeping due to leg cramps and aches. States legs cramp when she is sleeping or sitting for long periods. Has been taking Methocarbamol and states this helped her at one point but recently it does not seem to help her. She has been using ice packs to muscles with some relief.   Also reports being diagnosed with chronic fatigue syndrome- diagnosed by Dr. Arelia Sneddon.  Has had 2 surgeries for TMJ.  States she had tendonitis in left ankle- had an injection by Dr. Charlestine Night last year.  Right knee surgery- arthritis had microscopic surgery in  [place].    Has history of chronic migraines- has seen neurologist in past. Headaches are triggered by certain foods, pretty well controlled.      Review of Systems Pertinent positives and negatives in the history of present illness.     Objective:   Physical Exam BP 128/82 mmHg  Pulse 64  Wt 233 lb 6.4 oz (105.87 kg)  Alert and in no distress.  Cardiac exam shows a regular sinus rhythm without murmurs or gallops. Lungs are clear to auscultation.      Assessment & Plan:  Bilateral leg cramps - Plan: CBC with Differential/Platelet, Comprehensive metabolic panel, TSH, Magnesium, VITAMIN D 25 Hydroxy (Vit-D Deficiency, Fractures)  Chronic pain syndrome - Plan: CBC with Differential/Platelet, Comprehensive metabolic panel, TSH, Magnesium, VITAMIN D 25 Hydroxy (Vit-D Deficiency, Fractures)  Osteoarthritis of right knee, unspecified osteoarthritis type - Plan: VITAMIN D 25 Hydroxy (Vit-D Deficiency, Fractures)  Encounter to establish care  History of migraine  History of chronic fatigue syndrome - Plan: TSH, Magnesium, VITAMIN D 25 Hydroxy (Vit-D Deficiency, Fractures)  Sleep disturbance  Fibromyalgia  Discussed that I will need to review her medical records. Will order labs to look for any underlying explanation for muscle cramps. Suspect that if we can get the cramps under control that her sleep will improve. Recommend taking a hot bath before bed, stretching for LE demonstrated in office, patient  states she is knowledgeable of these stretches. She may also continue using ice packs on muscle cramps.  Migraines appear to be well managed by watching diet and avoiding food triggers.  Reviewed hip XR with patient from the emergency department in 02/2015, it did show mild-moderate osteoarthritis of right hip.  Recommend she return for physical when due. Will follow up pending labs. Suspect that she will need pain management referral for her chronic pain and  osteoarthritis and fibromyalgia. Discussed that I am not comfortable treating chronic pain and do not prescribe narcotic pain medication on a long term basis.

## 2015-06-12 LAB — COMPREHENSIVE METABOLIC PANEL
ALT: 27 U/L (ref 6–29)
AST: 20 U/L (ref 10–35)
Albumin: 4.2 g/dL (ref 3.6–5.1)
Alkaline Phosphatase: 103 U/L (ref 33–130)
BUN: 16 mg/dL (ref 7–25)
CO2: 25 mmol/L (ref 20–31)
Calcium: 9.3 mg/dL (ref 8.6–10.4)
Chloride: 105 mmol/L (ref 98–110)
Creat: 0.81 mg/dL (ref 0.50–1.05)
Glucose, Bld: 104 mg/dL — ABNORMAL HIGH (ref 65–99)
Potassium: 4 mmol/L (ref 3.5–5.3)
Sodium: 140 mmol/L (ref 135–146)
Total Bilirubin: 0.4 mg/dL (ref 0.2–1.2)
Total Protein: 7.2 g/dL (ref 6.1–8.1)

## 2015-06-12 LAB — MAGNESIUM: Magnesium: 2 mg/dL (ref 1.5–2.5)

## 2015-06-12 LAB — TSH: TSH: 0.7 mIU/L

## 2015-06-12 LAB — VITAMIN D 25 HYDROXY (VIT D DEFICIENCY, FRACTURES): Vit D, 25-Hydroxy: 7 ng/mL — ABNORMAL LOW (ref 30–100)

## 2015-06-13 ENCOUNTER — Telehealth: Payer: Self-pay | Admitting: Family Medicine

## 2015-06-13 NOTE — Telephone Encounter (Signed)
Pt called requesting lab results

## 2015-06-14 MED ORDER — VITAMIN D (ERGOCALCIFEROL) 1.25 MG (50000 UNIT) PO CAPS: 50000.0000 [IU] | ORAL_CAPSULE | ORAL | Status: DC

## 2015-06-14 NOTE — Telephone Encounter (Signed)
Pt notified of results

## 2015-06-14 NOTE — Telephone Encounter (Signed)
Were you able to reach her? She would like lab results. thanks

## 2015-06-14 NOTE — Telephone Encounter (Signed)
Pt read mychart results but no med was sent in to pharmacy so i have sent it in

## 2015-06-17 ENCOUNTER — Encounter: Payer: Self-pay | Admitting: Family Medicine

## 2015-06-17 DIAGNOSIS — M797 Fibromyalgia: Secondary | ICD-10-CM | POA: Insufficient documentation

## 2015-06-25 ENCOUNTER — Ambulatory Visit (INDEPENDENT_AMBULATORY_CARE_PROVIDER_SITE_OTHER): Payer: BLUE CROSS/BLUE SHIELD | Admitting: Family Medicine

## 2015-06-25 ENCOUNTER — Encounter: Payer: Self-pay | Admitting: Family Medicine

## 2015-06-25 VITALS — BP 122/82 | HR 64 | Wt 233.0 lb

## 2015-06-25 DIAGNOSIS — M26629 Arthralgia of temporomandibular joint, unspecified side: Secondary | ICD-10-CM | POA: Diagnosis not present

## 2015-06-25 DIAGNOSIS — M19042 Primary osteoarthritis, left hand: Secondary | ICD-10-CM | POA: Insufficient documentation

## 2015-06-25 DIAGNOSIS — M19041 Primary osteoarthritis, right hand: Secondary | ICD-10-CM | POA: Insufficient documentation

## 2015-06-25 DIAGNOSIS — M179 Osteoarthritis of knee, unspecified: Secondary | ICD-10-CM

## 2015-06-25 DIAGNOSIS — M1711 Unilateral primary osteoarthritis, right knee: Secondary | ICD-10-CM

## 2015-06-25 DIAGNOSIS — G8929 Other chronic pain: Secondary | ICD-10-CM | POA: Insufficient documentation

## 2015-06-25 DIAGNOSIS — G894 Chronic pain syndrome: Secondary | ICD-10-CM | POA: Diagnosis not present

## 2015-06-25 DIAGNOSIS — R252 Cramp and spasm: Secondary | ICD-10-CM | POA: Diagnosis not present

## 2015-06-25 DIAGNOSIS — M7070 Other bursitis of hip, unspecified hip: Secondary | ICD-10-CM | POA: Insufficient documentation

## 2015-06-25 DIAGNOSIS — M797 Fibromyalgia: Secondary | ICD-10-CM | POA: Diagnosis not present

## 2015-06-25 DIAGNOSIS — M7071 Other bursitis of hip, right hip: Secondary | ICD-10-CM

## 2015-06-25 HISTORY — DX: Cramp and spasm: R25.2

## 2015-06-25 NOTE — Patient Instructions (Signed)
Try taking a multi-vitamin and magnesium. [name] made is a good brand.  Wean yourself off hydrocodone and I will call you with recommendation.  Stay well hydrated.

## 2015-06-25 NOTE — Progress Notes (Signed)
   Subjective:    Patient ID: [name], female    DOB: [birth year], 50 y.o.   MRN: [id number]  HPI Chief Complaint  Patient presents with  . leg cramping    leg cramping, left arm at night numb   She is here for follow-up on leg cramping daytime and nighttime but more so at night. Lab results at her last appointment did reveal severe vitamin D deficiency and she is taking vitamin D once weekly.  She also reports her the front of her left arm occasionally goes numb at night and she states she was tested for carpal tunnel in past and was told she did not have it. She states the feeling returns within seconds after straightening out her arm. Denies weakness.  She has history of chronic pain, arthritis, bursitis to right hip, TMJ pain with surgery bilaterally, muscle aches and cramping. She has seen Dr. Charlestine Night, Rheumatologist, in past and recently switched her primary care to this office after being treated by Dr. Arelia Sneddon at [facility] for 20 years.  She states she is tired of "having to take so many pain medications" and would like to wean off muscle relaxer and hydrocodone but is not sure she can do this. She would like to be referred to pain management. States she has wanted this in past but did not have insurance. She states she needs to be able to work and would appreciate help managing her pain.   She reports having tried multiple medications in past for help with aches and sleep including Ambien, trazodone, prozac, flexeril, and celebrex. She states there are more but cannot recall them all at this time.    Review of Systems Pertinent positives and negatives in the history of present illness.     Objective:   Physical Exam BP 122/82 mmHg  Pulse 64  Wt 233 lb (105.688 kg)  Alert and oriented and in no acute distress. Not otherwise examined.      Assessment & Plan:  Cramps of lower extremity, unspecified laterality  Osteoarthritis of right knee, unspecified  osteoarthritis type  Hip bursitis, right  Chronic TMJ pain  Chronic pain syndrome  Muscle pain, fibromyalgia  Discussed that she can try weaning herself off the pain medication since she feels like it is no longer helping. Referral made to pain management specialist for further evaluation and treatment due to her complicated pain history.  I recommend that she start taking a multivitamin with magnesium, using heat, stretches and staying well hydrated to try to improve cramping.  Will follow up pending pain management visit and feedback.

## 2015-07-01 ENCOUNTER — Telehealth: Payer: Self-pay | Admitting: Family Medicine

## 2015-07-01 NOTE — Telephone Encounter (Signed)
Please call her and tell her to stop Nortriptyline and start taking Cymbalta daily. Let's see if this helps since she does not feel like she is benefiting from the Nortriptyline.  Let her know that she will take 30 mg daily for 1 week then if she is doing okay on the medication and increase to 60 mg daily after that. Please ask her if she has heard from the pain management specialist. Please call in Cymbalta 30 mg x 1 week then if she is tolerating the medication we will increase to 60 mg daily. 1 refill until we see if she is doing ok on this medication

## 2015-07-01 NOTE — Telephone Encounter (Signed)
Pt called and asked if a decision has been made on if you are going to put her on Lyrica or Symbalta. Please call pt at [phone].

## 2015-07-02 MED ORDER — DULOXETINE HCL 30 MG PO CPEP: 30.0000 mg | ORAL_CAPSULE | Freq: Every day | ORAL | Status: DC

## 2015-07-02 MED ORDER — DULOXETINE HCL 60 MG PO CPEP: 60.0000 mg | ORAL_CAPSULE | Freq: Every day | ORAL | Status: DC

## 2015-07-02 NOTE — Telephone Encounter (Signed)
Pt was notified about starting cymbalta. Sent in 30mg  for a week then 60mg  with a couple refills. Pain management is looking over case and will either let us know or  Call and schedule appt with pt

## 2015-07-02 NOTE — Telephone Encounter (Signed)
Pt is scheduled with Dr. Balinda Quails on 07/19/15 @ 8:40am with preferred pain management and spine care. Pt is aware of appt

## 2015-08-01 DIAGNOSIS — M5417 Radiculopathy, lumbosacral region: Secondary | ICD-10-CM | POA: Diagnosis not present

## 2015-08-04 ENCOUNTER — Other Ambulatory Visit: Payer: Self-pay | Admitting: Family Medicine

## 2015-08-19 DIAGNOSIS — M79606 Pain in leg, unspecified: Secondary | ICD-10-CM | POA: Diagnosis not present

## 2015-08-19 DIAGNOSIS — Z79899 Other long term (current) drug therapy: Secondary | ICD-10-CM | POA: Diagnosis not present

## 2015-08-19 DIAGNOSIS — G894 Chronic pain syndrome: Secondary | ICD-10-CM | POA: Diagnosis not present

## 2015-08-19 DIAGNOSIS — Z79891 Long term (current) use of opiate analgesic: Secondary | ICD-10-CM | POA: Diagnosis not present

## 2015-08-19 DIAGNOSIS — M25559 Pain in unspecified hip: Secondary | ICD-10-CM | POA: Diagnosis not present

## 2015-08-19 DIAGNOSIS — M545 Low back pain: Secondary | ICD-10-CM | POA: Diagnosis not present

## 2015-09-17 DIAGNOSIS — M545 Low back pain: Secondary | ICD-10-CM | POA: Diagnosis not present

## 2015-09-17 DIAGNOSIS — M25559 Pain in unspecified hip: Secondary | ICD-10-CM | POA: Diagnosis not present

## 2015-09-17 DIAGNOSIS — Z79899 Other long term (current) drug therapy: Secondary | ICD-10-CM | POA: Diagnosis not present

## 2015-09-17 DIAGNOSIS — G894 Chronic pain syndrome: Secondary | ICD-10-CM | POA: Diagnosis not present

## 2015-09-17 DIAGNOSIS — Z79891 Long term (current) use of opiate analgesic: Secondary | ICD-10-CM | POA: Diagnosis not present

## 2015-09-17 DIAGNOSIS — M79606 Pain in leg, unspecified: Secondary | ICD-10-CM | POA: Diagnosis not present

## 2015-09-18 DIAGNOSIS — M7060 Trochanteric bursitis, unspecified hip: Secondary | ICD-10-CM | POA: Diagnosis not present

## 2015-09-24 ENCOUNTER — Other Ambulatory Visit: Payer: Self-pay | Admitting: Family Medicine

## 2015-10-08 ENCOUNTER — Ambulatory Visit (HOSPITAL_COMMUNITY)
Admission: RE | Admit: 2015-10-08 | Discharge: 2015-10-08 | Disposition: A | Discharge status: Home / Self Care | Payer: BLUE CROSS/BLUE SHIELD | Source: Ambulatory Visit | Attending: Anesthesiology | Admitting: Anesthesiology

## 2015-10-08 ENCOUNTER — Other Ambulatory Visit (HOSPITAL_COMMUNITY): Payer: Self-pay | Admitting: Anesthesiology

## 2015-10-08 DIAGNOSIS — M545 Low back pain: Secondary | ICD-10-CM | POA: Insufficient documentation

## 2015-10-08 DIAGNOSIS — M5137 Other intervertebral disc degeneration, lumbosacral region: Secondary | ICD-10-CM | POA: Diagnosis not present

## 2015-10-08 DIAGNOSIS — M5441 Lumbago with sciatica, right side: Secondary | ICD-10-CM

## 2015-10-08 DIAGNOSIS — M79606 Pain in leg, unspecified: Secondary | ICD-10-CM | POA: Diagnosis not present

## 2015-10-08 DIAGNOSIS — M5136 Other intervertebral disc degeneration, lumbar region: Secondary | ICD-10-CM | POA: Diagnosis not present

## 2015-10-08 DIAGNOSIS — M79604 Pain in right leg: Secondary | ICD-10-CM

## 2015-10-14 DIAGNOSIS — M25559 Pain in unspecified hip: Secondary | ICD-10-CM | POA: Diagnosis not present

## 2015-10-14 DIAGNOSIS — M545 Low back pain: Secondary | ICD-10-CM | POA: Diagnosis not present

## 2015-10-14 DIAGNOSIS — G894 Chronic pain syndrome: Secondary | ICD-10-CM | POA: Diagnosis not present

## 2015-10-14 DIAGNOSIS — Z79899 Other long term (current) drug therapy: Secondary | ICD-10-CM | POA: Diagnosis not present

## 2015-10-14 DIAGNOSIS — Z79891 Long term (current) use of opiate analgesic: Secondary | ICD-10-CM | POA: Diagnosis not present

## 2015-11-11 DIAGNOSIS — Z79891 Long term (current) use of opiate analgesic: Secondary | ICD-10-CM | POA: Diagnosis not present

## 2015-11-11 DIAGNOSIS — M79606 Pain in leg, unspecified: Secondary | ICD-10-CM | POA: Diagnosis not present

## 2015-11-11 DIAGNOSIS — M545 Low back pain: Secondary | ICD-10-CM | POA: Diagnosis not present

## 2015-11-11 DIAGNOSIS — Z79899 Other long term (current) drug therapy: Secondary | ICD-10-CM | POA: Diagnosis not present

## 2015-11-11 DIAGNOSIS — G894 Chronic pain syndrome: Secondary | ICD-10-CM | POA: Diagnosis not present

## 2015-11-11 DIAGNOSIS — M25559 Pain in unspecified hip: Secondary | ICD-10-CM | POA: Diagnosis not present

## 2015-11-26 ENCOUNTER — Other Ambulatory Visit: Payer: Self-pay | Admitting: Family Medicine

## 2015-11-26 NOTE — Telephone Encounter (Signed)
She needs to come in. I haven't seen her since March. She is seeing a pain management specialist and I would like there feedback as to if she should continue on this medication or not.

## 2015-11-26 NOTE — Telephone Encounter (Signed)
Is this okay to refill? 

## 2015-11-27 NOTE — Telephone Encounter (Signed)
Can you please call her and check on this.

## 2015-11-27 NOTE — Telephone Encounter (Signed)
Is this okay to refill? 

## 2015-11-27 NOTE — Telephone Encounter (Signed)
Denying med. Waiting for pt to call me back as well

## 2015-11-27 NOTE — Telephone Encounter (Signed)
Left message for pt to call me back 

## 2015-12-04 MED ORDER — DULOXETINE HCL 60 MG PO CPEP: ORAL_CAPSULE | ORAL | 0 refills | Status: DC

## 2015-12-04 NOTE — Addendum Note (Signed)
Addended by: Minette Headland A on: 12/04/2015 03:29 PM   Modules accepted: Orders

## 2015-12-04 NOTE — Telephone Encounter (Signed)
Pt called back and states that she goes to pain management later on this month and will get a letter from them then. I will refill med for 30 days and then pt will come in before her meds runs out.

## 2015-12-10 ENCOUNTER — Telehealth: Payer: Self-pay | Admitting: Family Medicine

## 2015-12-10 DIAGNOSIS — M545 Low back pain: Secondary | ICD-10-CM | POA: Diagnosis not present

## 2015-12-10 DIAGNOSIS — Z79899 Other long term (current) drug therapy: Secondary | ICD-10-CM | POA: Diagnosis not present

## 2015-12-10 DIAGNOSIS — M79606 Pain in leg, unspecified: Secondary | ICD-10-CM | POA: Diagnosis not present

## 2015-12-10 DIAGNOSIS — M25559 Pain in unspecified hip: Secondary | ICD-10-CM | POA: Diagnosis not present

## 2015-12-10 DIAGNOSIS — G894 Chronic pain syndrome: Secondary | ICD-10-CM | POA: Diagnosis not present

## 2015-12-10 DIAGNOSIS — Z79891 Long term (current) use of opiate analgesic: Secondary | ICD-10-CM | POA: Diagnosis not present

## 2015-12-10 MED ORDER — DULOXETINE HCL 60 MG PO CPEP: ORAL_CAPSULE | ORAL | 2 refills | Status: DC

## 2015-12-10 NOTE — Telephone Encounter (Signed)
Pt was notified. I have refilled it for 3 months. She will sign a release next time she is here or if she goes back there first.

## 2015-12-10 NOTE — Telephone Encounter (Signed)
I appreciate patient checking with her pain management specialist regarding this. Please let her know that we will refill this medication for her. I would also like for her to sign a release so that I can get documentation from pain management provider please.

## 2015-12-10 NOTE — Telephone Encounter (Signed)
Pt called and wanted to let you know that Dr Legrand Como with the Pain management states that the Cymbalta is doing good with the treatment with the pain management clinic, pt can be reached at [phone] with any questions,

## 2016-01-06 DIAGNOSIS — Z79891 Long term (current) use of opiate analgesic: Secondary | ICD-10-CM | POA: Diagnosis not present

## 2016-01-06 DIAGNOSIS — M25559 Pain in unspecified hip: Secondary | ICD-10-CM | POA: Diagnosis not present

## 2016-01-06 DIAGNOSIS — G894 Chronic pain syndrome: Secondary | ICD-10-CM | POA: Diagnosis not present

## 2016-01-06 DIAGNOSIS — M545 Low back pain: Secondary | ICD-10-CM | POA: Diagnosis not present

## 2016-01-06 DIAGNOSIS — M79606 Pain in leg, unspecified: Secondary | ICD-10-CM | POA: Diagnosis not present

## 2016-01-06 DIAGNOSIS — Z79899 Other long term (current) drug therapy: Secondary | ICD-10-CM | POA: Diagnosis not present

## 2016-01-17 ENCOUNTER — Telehealth: Payer: Self-pay

## 2016-01-17 MED ORDER — DULOXETINE HCL 60 MG PO CPEP: ORAL_CAPSULE | ORAL | 0 refills | Status: DC

## 2016-01-17 NOTE — Telephone Encounter (Signed)
Request for 90 day supply of Cymbalta to [facility].Rx sent for 90 days.  [name]

## 2016-02-03 DIAGNOSIS — Z79891 Long term (current) use of opiate analgesic: Secondary | ICD-10-CM | POA: Diagnosis not present

## 2016-02-03 DIAGNOSIS — M79606 Pain in leg, unspecified: Secondary | ICD-10-CM | POA: Diagnosis not present

## 2016-02-03 DIAGNOSIS — G894 Chronic pain syndrome: Secondary | ICD-10-CM | POA: Diagnosis not present

## 2016-02-03 DIAGNOSIS — Z79899 Other long term (current) drug therapy: Secondary | ICD-10-CM | POA: Diagnosis not present

## 2016-02-03 DIAGNOSIS — M545 Low back pain: Secondary | ICD-10-CM | POA: Diagnosis not present

## 2016-02-03 DIAGNOSIS — M25559 Pain in unspecified hip: Secondary | ICD-10-CM | POA: Diagnosis not present

## 2016-03-02 DIAGNOSIS — M545 Low back pain: Secondary | ICD-10-CM | POA: Diagnosis not present

## 2016-03-02 DIAGNOSIS — Z79899 Other long term (current) drug therapy: Secondary | ICD-10-CM | POA: Diagnosis not present

## 2016-03-02 DIAGNOSIS — Z79891 Long term (current) use of opiate analgesic: Secondary | ICD-10-CM | POA: Diagnosis not present

## 2016-03-02 DIAGNOSIS — G894 Chronic pain syndrome: Secondary | ICD-10-CM | POA: Diagnosis not present

## 2016-03-02 DIAGNOSIS — M79606 Pain in leg, unspecified: Secondary | ICD-10-CM | POA: Diagnosis not present

## 2016-03-02 DIAGNOSIS — M25559 Pain in unspecified hip: Secondary | ICD-10-CM | POA: Diagnosis not present

## 2016-03-10 DIAGNOSIS — G5603 Carpal tunnel syndrome, bilateral upper limbs: Secondary | ICD-10-CM | POA: Diagnosis not present

## 2016-03-30 DIAGNOSIS — G894 Chronic pain syndrome: Secondary | ICD-10-CM | POA: Diagnosis not present

## 2016-03-30 DIAGNOSIS — M545 Low back pain: Secondary | ICD-10-CM | POA: Diagnosis not present

## 2016-03-30 DIAGNOSIS — Z79899 Other long term (current) drug therapy: Secondary | ICD-10-CM | POA: Diagnosis not present

## 2016-03-30 DIAGNOSIS — M25559 Pain in unspecified hip: Secondary | ICD-10-CM | POA: Diagnosis not present

## 2016-03-30 DIAGNOSIS — M79606 Pain in leg, unspecified: Secondary | ICD-10-CM | POA: Diagnosis not present

## 2016-03-30 DIAGNOSIS — Z79891 Long term (current) use of opiate analgesic: Secondary | ICD-10-CM | POA: Diagnosis not present

## 2016-04-27 DIAGNOSIS — M545 Low back pain: Secondary | ICD-10-CM | POA: Diagnosis not present

## 2016-04-27 DIAGNOSIS — Z79891 Long term (current) use of opiate analgesic: Secondary | ICD-10-CM | POA: Diagnosis not present

## 2016-04-27 DIAGNOSIS — M79606 Pain in leg, unspecified: Secondary | ICD-10-CM | POA: Diagnosis not present

## 2016-04-27 DIAGNOSIS — M25559 Pain in unspecified hip: Secondary | ICD-10-CM | POA: Diagnosis not present

## 2016-04-27 DIAGNOSIS — G894 Chronic pain syndrome: Secondary | ICD-10-CM | POA: Diagnosis not present

## 2016-04-27 DIAGNOSIS — Z79899 Other long term (current) drug therapy: Secondary | ICD-10-CM | POA: Diagnosis not present

## 2016-05-02 ENCOUNTER — Other Ambulatory Visit: Payer: Self-pay | Admitting: Family Medicine

## 2016-05-05 ENCOUNTER — Ambulatory Visit (INDEPENDENT_AMBULATORY_CARE_PROVIDER_SITE_OTHER): Payer: BLUE CROSS/BLUE SHIELD | Admitting: Family Medicine

## 2016-05-05 ENCOUNTER — Encounter: Payer: Self-pay | Admitting: Family Medicine

## 2016-05-05 VITALS — BP 120/82 | HR 85 | Wt 199.8 lb

## 2016-05-05 DIAGNOSIS — Z113 Encounter for screening for infections with a predominantly sexual mode of transmission: Secondary | ICD-10-CM | POA: Diagnosis not present

## 2016-05-05 DIAGNOSIS — Z1159 Encounter for screening for other viral diseases: Secondary | ICD-10-CM

## 2016-05-05 DIAGNOSIS — Z0000 Encounter for general adult medical examination without abnormal findings: Secondary | ICD-10-CM | POA: Diagnosis not present

## 2016-05-05 DIAGNOSIS — G894 Chronic pain syndrome: Secondary | ICD-10-CM | POA: Diagnosis not present

## 2016-05-05 DIAGNOSIS — E559 Vitamin D deficiency, unspecified: Secondary | ICD-10-CM | POA: Diagnosis not present

## 2016-05-05 DIAGNOSIS — Z8639 Personal history of other endocrine, nutritional and metabolic disease: Secondary | ICD-10-CM

## 2016-05-05 LAB — CBC WITH DIFFERENTIAL/PLATELET
Basophils Absolute: 0 cells/uL (ref 0–200)
Basophils Relative: 0 %
Eosinophils Absolute: 142 cells/uL (ref 15–500)
Eosinophils Relative: 2 %
HCT: 38.8 % (ref 35.0–45.0)
Hemoglobin: 12.8 g/dL (ref 11.7–15.5)
Lymphocytes Relative: 28 %
Lymphs Abs: 1988 cells/uL (ref 850–3900)
MCH: 27.7 pg (ref 27.0–33.0)
MCHC: 33 g/dL (ref 32.0–36.0)
MCV: 84 fL (ref 80.0–100.0)
MPV: 10.9 fL (ref 7.5–12.5)
Monocytes Absolute: 426 cells/uL (ref 200–950)
Monocytes Relative: 6 %
Neutro Abs: 4544 cells/uL (ref 1500–7800)
Neutrophils Relative %: 64 %
Platelets: 288 10*3/uL (ref 140–400)
RBC: 4.62 MIL/uL (ref 3.80–5.10)
RDW: 14.6 % (ref 11.0–15.0)
WBC: 7.1 10*3/uL (ref 4.0–10.5)

## 2016-05-05 LAB — COMPREHENSIVE METABOLIC PANEL
ALT: 12 U/L (ref 6–29)
AST: 14 U/L (ref 10–35)
Albumin: 4.2 g/dL (ref 3.6–5.1)
Alkaline Phosphatase: 108 U/L (ref 33–130)
BUN: 14 mg/dL (ref 7–25)
CO2: 29 mmol/L (ref 20–31)
Calcium: 9.8 mg/dL (ref 8.6–10.4)
Chloride: 102 mmol/L (ref 98–110)
Creat: 0.92 mg/dL (ref 0.50–1.05)
Glucose, Bld: 122 mg/dL — ABNORMAL HIGH (ref 65–99)
Potassium: 4 mmol/L (ref 3.5–5.3)
Sodium: 141 mmol/L (ref 135–146)
Total Bilirubin: 0.4 mg/dL (ref 0.2–1.2)
Total Protein: 7.4 g/dL (ref 6.1–8.1)

## 2016-05-05 LAB — LIPID PANEL
Cholesterol: 201 mg/dL — ABNORMAL HIGH (ref <200)
HDL: 59 mg/dL (ref >50)
LDL Cholesterol: 116 mg/dL — ABNORMAL HIGH (ref <100)
Total CHOL/HDL Ratio: 3.4 Ratio (ref <5.0)
Triglycerides: 131 mg/dL (ref <150)
VLDL: 26 mg/dL (ref <30)

## 2016-05-05 LAB — TSH: TSH: 0.71 mIU/L

## 2016-05-05 MED ORDER — DULOXETINE HCL 60 MG PO CPEP: ORAL_CAPSULE | ORAL | 1 refills | Status: DC

## 2016-05-05 NOTE — Patient Instructions (Signed)
Follow up for a physical in the next 2 weeks.

## 2016-05-05 NOTE — Progress Notes (Signed)
   Subjective:    Patient ID: [name], female    DOB: Sep 21, 1965, 51 y.o.   MRN: [id number]  HPI Chief Complaint  Patient presents with  . follow-up    follow-up on cymbalta.    She is here for a med check appointment and requests refills on Cymbalta. She is under the care of a pain management specialist for chronic pain and fibromyalgia and reports she is doing well.  She was recently put on Gabapentin and states it made her pain worse so the pain management provider stopped this med. She reports doing well on current regimen including cymbalta.  She is also going to see an orthopedist for left wrist pain. Is currently wearing a splint to her left wrist.   Reports eating healthy and cutting back on pasta and french fries and drinking more water daily.  Has lost 20 lbs. States she is feeling good.   She did not return for follow up on vitamin D. States she is taking 1,000 IU of vitamin D daily.   Reports history of prediabetes and hyperlipidemia. She is overdue for a CPE and fasting labs. She states she is fasting today.   Denies fever, chills, fatigue, dizziness, chest pain, palpitations, abdominal pain, N/V/D.   Past Medical History:  Diagnosis Date  . Arthritis   . Chronic headaches   . Depression   . Fibromyalgia   . Insomnia   . Snoring   . TMJ (dislocation of temporomandibular joint)    Past Surgical History:  Procedure Laterality Date  . ABDOMINAL HYSTERECTOMY    . CESAREAN SECTION    . cyst from hand    . KNEE SURGERY     right  . TEMPOROMANDIBULAR JOINT SURGERY    . TONSILLECTOMY      Review of Systems Pertinent positives and negatives in the history of present illness.     Objective:   Physical Exam BP 120/82   Pulse 85   Wt 199 lb 12.8 oz (90.6 kg)   Alert and oriented and in no acute distress.       Assessment & Plan:  Vitamin D deficiency - Plan: VITAMIN D 25 Hydroxy (Vit-D Deficiency, Fractures)  History of hyperlipidemia - Plan: Lipid  panel  Chronic pain syndrome - Plan: DULoxetine (CYMBALTA) 60 MG capsule  History of elevated glucose - Plan: Hemoglobin A1c  Routine general medical examination at a health care facility - Plan: CBC with Differential/Platelet, Comprehensive metabolic panel, TSH, Lipid panel  Need for hepatitis C screening test - Plan: Hepatitis C antibody  Screening examination for STD (sexually transmitted disease) - Plan: RPR, HIV antibody  Discussed that we will recheck her vitamin D level, she did not return to have this checked after taking a course of prescription strength vitamin D last year. She will continue on daily vitamin D 1,000 IU for now.  Recommend that she continue seeing her pain management specialist for chronic pain. Cymbalta refilled.  Congratulated her on weight loss and encouraged her to continue eating healthy. Counseled on weight loss improving joint pain and reducing her risk of developing diabetes and other chronic health conditions.  She is fasting today and would like to have fasting labs drawn.  She will return for a CPE in the next 2 weeks.

## 2016-05-06 LAB — VITAMIN D 25 HYDROXY (VIT D DEFICIENCY, FRACTURES): Vit D, 25-Hydroxy: 26 ng/mL — ABNORMAL LOW (ref 30–100)

## 2016-05-06 LAB — HEMOGLOBIN A1C
Hgb A1c MFr Bld: 6.8 % — ABNORMAL HIGH (ref <5.7)
Mean Plasma Glucose: 148 mg/dL

## 2016-05-06 LAB — RPR

## 2016-05-06 LAB — HEPATITIS C ANTIBODY: HCV Ab: NEGATIVE

## 2016-05-06 LAB — HIV ANTIBODY (ROUTINE TESTING W REFLEX): HIV 1&2 Ab, 4th Generation: NONREACTIVE

## 2016-05-07 ENCOUNTER — Encounter: Payer: Self-pay | Admitting: Family Medicine

## 2016-05-11 ENCOUNTER — Encounter: Payer: Self-pay | Admitting: Family Medicine

## 2016-05-11 ENCOUNTER — Ambulatory Visit (INDEPENDENT_AMBULATORY_CARE_PROVIDER_SITE_OTHER): Payer: BLUE CROSS/BLUE SHIELD | Admitting: Family Medicine

## 2016-05-11 VITALS — BP 130/80 | HR 94 | Ht 63.5 in | Wt 200.2 lb

## 2016-05-11 DIAGNOSIS — E119 Type 2 diabetes mellitus without complications: Secondary | ICD-10-CM

## 2016-05-11 DIAGNOSIS — Z1211 Encounter for screening for malignant neoplasm of colon: Secondary | ICD-10-CM | POA: Diagnosis not present

## 2016-05-11 DIAGNOSIS — E669 Obesity, unspecified: Secondary | ICD-10-CM | POA: Diagnosis not present

## 2016-05-11 DIAGNOSIS — Z1231 Encounter for screening mammogram for malignant neoplasm of breast: Secondary | ICD-10-CM | POA: Diagnosis not present

## 2016-05-11 DIAGNOSIS — Z0000 Encounter for general adult medical examination without abnormal findings: Secondary | ICD-10-CM | POA: Diagnosis not present

## 2016-05-11 DIAGNOSIS — Z23 Encounter for immunization: Secondary | ICD-10-CM

## 2016-05-11 DIAGNOSIS — Z1239 Encounter for other screening for malignant neoplasm of breast: Secondary | ICD-10-CM

## 2016-05-11 LAB — POCT URINALYSIS DIPSTICK
Bilirubin, UA: NEGATIVE
Blood, UA: 5.5
Glucose, UA: NEGATIVE
Ketones, UA: NEGATIVE
Leukocytes, UA: NEGATIVE
Nitrite, UA: NEGATIVE
Protein, UA: NEGATIVE
Spec Grav, UA: >=1.03
Urobilinogen, UA: NEGATIVE
pH, UA: 5.5

## 2016-05-11 MED ORDER — GLUCOSE BLOOD VI STRP: ORAL_STRIP | 2 refills | Status: DC

## 2016-05-11 MED ORDER — ONETOUCH DELICA LANCETS FINE MISC: 2 refills | Status: DC

## 2016-05-11 NOTE — Progress Notes (Signed)
Subjective:    Patient ID: [name], female    DOB: 07/01/1965, 51 y.o.   MRN: [id number]  HPI Chief Complaint  Patient presents with  . cpe    cpe, no other concerns. declines flu shot   She is here for a complete physical exam and to discuss new onset diabetes.  She has a history of prediabetes but her last A1C was 6.8. She denies having diabetes in the past. She reports she has been losing weight over the past several months and plans to continue.  No concerns or complaints today.   Other providers: Pain management.   Social history: Lives with husband, works as Print production planner at [employer]: carbohydrates, eats a lot of pasta.  Excerise: once per week.   Immunizations: flu shot -refused. Tdap- unknown  Health maintenance:  Mammogram: last year.  Colonoscopy: never Last Menstrual cycle: hysterectomy  Last Dental Exam: November 2017 Last Eye Exam: 2 years ago.   Wears seatbelt always, smoke detectors in home and functioning, does not text while driving and feels safe in home environment.   Reviewed allergies, medications, past medical, surgical, family, and social history.    Review of Systems Review of Systems Constitutional: -fever, -chills, -sweats, -unexpected weight change,-fatigue ENT: -runny nose, -ear pain, -sore throat Cardiology:  -chest pain, -palpitations, -edema Respiratory: -cough, -shortness of breath, -wheezing Gastroenterology: -abdominal pain, -nausea, -vomiting, -diarrhea, -constipation  Hematology: -bleeding or bruising problems Musculoskeletal: -arthralgias, -myalgias, -joint swelling, -back pain Ophthalmology: -vision changes Urology: -dysuria, -difficulty urinating, -hematuria, -urinary frequency, -urgency Neurology: -headache, -weakness, -tingling, -numbness       Objective:   Physical Exam BP 130/80   Pulse 94   Ht 5' 3.5" (1.613 m)   Wt 200 lb 3.2 oz (90.8 kg)   BMI 34.91 kg/m   General Appearance:    Alert, cooperative,  no distress, appears stated age  Head:    Normocephalic, without obvious abnormality, atraumatic  Eyes:    PERRL, conjunctiva/corneas clear, EOM's intact, fundi    benign  Ears:    Normal TM's and external ear canals  Nose:   Nares normal, mucosa normal, no drainage or sinus   tenderness  Throat:   Lips, mucosa, and tongue normal; teeth and gums normal  Neck:   Supple, no lymphadenopathy;  thyroid:  no   enlargement/tenderness/nodules; no carotid   bruit or JVD  Back:    Spine nontender, no curvature, ROM normal, no CVA     tenderness  Lungs:     Clear to auscultation bilaterally without wheezes, rales or     ronchi; respirations unlabored  Chest Wall:    No tenderness or deformity   Heart:    Regular rate and rhythm, S1 and S2 normal, no murmur, rub   or gallop  Breast Exam:    No tenderness, masses, or nipple discharge or inversion.      No axillary lymphadenopathy  Abdomen:     Soft, non-tender, nondistended, normoactive bowel sounds,    no masses, no hepatosplenomegaly  Genitalia:    Normal external genitalia without lesions.  BUS and vagina normal;  No abnormal vaginal discharge.  adnexa not enlarged, nontender, no masses.  Hysterectomy.   Rectal:    GI referral made.  Extremities:   No clubbing, cyanosis or edema  Pulses:   2+ and symmetric all extremities  Skin:   Skin color, texture, turgor normal, no rashes or lesions  Lymph nodes:   Cervical, supraclavicular, and axillary nodes normal  Neurologic:   CNII-XII intact, normal strength, sensation and gait; reflexes 2+ and symmetric throughout          Psych:   Normal mood, affect, hygiene and grooming.    Urinalysis dipstick: negative       Assessment & Plan:  Routine general medical examination at a health care facility - Plan: Urinalysis Dipstick  New onset type 2 diabetes mellitus ([place]) - Plan: Amb ref to Medical Nutrition Therapy-MNT  Obesity (BMI 30-39.9) - Plan: Amb ref to Medical Nutrition Therapy-MNT  Need for Tdap  vaccination - Plan: Tdap vaccine greater than or equal to 7yo IM  Special screening for malignant neoplasms, colon - Plan: Ambulatory referral to Gastroenterology  Screening for breast cancer - Plan: MM DIGITAL SCREENING BILATERAL  Discussed that she appears to be doing well overall. She will continue seeing her pain management specialist.  Counseled on diabetes and management. Meter and supplies given with instructions to check her blood sugar daily fasting. Referral to MNT. Will let her see what she can do over the next 3 months with diet and exercise in regards to blood sugars.  Counseled on weight loss and prevention of chronic health conditions.  She is aware that she needs to schedule a diabetic eye exam.  Referral made to GI for screening colonoscopy.  Mammogram ordered.  Tdap given. Flu shot refused.  Discussed safety and health maintenance.  Follow up in 3 months for diabetes.

## 2016-05-11 NOTE — Patient Instructions (Addendum)
Eye exam- schedule a diabetic eye exam. Please make sure they send [place] the documentation.  Mammogram- call and schedule this.  GI referral- they will call you and schedule an appointment to discuss a colonoscopy.  Nutritionist- they will call you to schedule an appointment. Check with your insurance regarding your co-pay.   Check your blood sugar daily in the morning fasting (prior to eating or drinking). Goal range is 80-110.  Start getting at least 150 minutes of physical activity per week outside of work.   Preventative Care for Adults - Female      MAINTAIN REGULAR HEALTH EXAMS:  A routine yearly physical is a good way to check in with your primary care provider about your health and preventive screening. It is also an opportunity to share updates about your health and any concerns you have, and receive a thorough all-over exam.   Most health insurance companies pay for at least some preventative services.  Check with your health plan for specific coverages.  WHAT PREVENTATIVE SERVICES DO WOMEN NEED?  Adult women should have their weight and blood pressure checked regularly.   Women age 72 and older should have their cholesterol levels checked regularly.  Women should be screened for cervical cancer with a Pap smear and pelvic exam beginning at either age 10, or 3 years after they become sexually activity.    Breast cancer screening generally begins at age 79 with a mammogram and breast exam by your primary care provider.    Beginning at age 28 and continuing to age 67, women should be screened for colorectal cancer.  Certain people may need continued testing until age 77.  Updating vaccinations is part of preventative care.  Vaccinations help protect against diseases such as the flu.  Osteoporosis is a disease in which the bones lose minerals and strength as we age. Women ages 53 and over should discuss this with their caregivers, as should women after menopause who have other risk  factors.  Lab tests are generally done as part of preventative care to screen for anemia and blood disorders, to screen for problems with the kidneys and liver, to screen for bladder problems, to check blood sugar, and to check your cholesterol level.  Preventative services generally include counseling about diet, exercise, avoiding tobacco, drugs, excessive alcohol consumption, and sexually transmitted infections.    GENERAL RECOMMENDATIONS FOR GOOD HEALTH:  Healthy diet:  Eat a variety of foods, including fruit, vegetables, animal or vegetable protein, such as meat, fish, chicken, and eggs, or beans, lentils, tofu, and grains, such as rice.  Drink plenty of water daily.  Decrease saturated fat in the diet, avoid lots of red meat, processed foods, sweets, fast foods, and fried foods.  Exercise:  Aerobic exercise helps maintain good heart health. At least 30-40 minutes of moderate-intensity exercise is recommended. For example, a brisk walk that increases your heart rate and breathing. This should be done on most days of the week.   Find a type of exercise or a variety of exercises that you enjoy so that it becomes a part of your daily life.  Examples are running, walking, swimming, water aerobics, and biking.  For motivation and support, explore group exercise such as aerobic class, spin class, Zumba, Yoga,or  martial arts, etc.    Set exercise goals for yourself, such as a certain weight goal, walk or run in a race such as a 5k walk/run.  Speak to your primary care provider about exercise goals.  Disease  prevention:  If you smoke or chew tobacco, find out from your caregiver how to quit. It can literally save your life, no matter how long you have been a tobacco user. If you do not use tobacco, never begin.   Maintain a healthy diet and normal weight. Increased weight leads to problems with blood pressure and diabetes.   The Body Mass Index or BMI is a way of measuring how much of  your body is fat. Having a BMI above 27 increases the risk of heart disease, diabetes, hypertension, stroke and other problems related to obesity. Your caregiver can help determine your BMI and based on it develop an exercise and dietary program to help you achieve or maintain this important measurement at a healthful level.  High blood pressure causes heart and blood vessel problems.  Persistent high blood pressure should be treated with medicine if weight loss and exercise do not work.   Fat and cholesterol leaves deposits in your arteries that can block them. This causes heart disease and vessel disease elsewhere in your body.  If your cholesterol is found to be high, or if you have heart disease or certain other medical conditions, then you may need to have your cholesterol monitored frequently and be treated with medication.   Ask if you should have a cardiac stress test if your history suggests this. A stress test is a test done on a treadmill that looks for heart disease. This test can find disease prior to there being a problem.  Menopause can be associated with physical symptoms and risks. Hormone replacement therapy is available to decrease these. You should talk to your caregiver about whether starting or continuing to take hormones is right for you.   Osteoporosis is a disease in which the bones lose minerals and strength as we age. This can result in serious bone fractures. Risk of osteoporosis can be identified using a bone density scan. Women ages 101 and over should discuss this with their caregivers, as should women after menopause who have other risk factors. Ask your caregiver whether you should be taking a calcium supplement and Vitamin D, to reduce the rate of osteoporosis.   Avoid drinking alcohol in excess (more than two drinks per day).  Avoid use of street drugs. Do not share needles with anyone. Ask for professional help if you need assistance or instructions on stopping the use  of alcohol, cigarettes, and/or drugs.  Brush your teeth twice a day with fluoride toothpaste, and floss once a day. Good oral hygiene prevents tooth decay and gum disease. The problems can be painful, unattractive, and can cause other health problems. Visit your dentist for a routine oral and dental check up and preventive care every 6-12 months.   Look at your skin regularly.  Use a mirror to look at your back. Notify your caregivers of changes in moles, especially if there are changes in shapes, colors, a size larger than a pencil eraser, an irregular border, or development of new moles.  Safety:  Use seatbelts 100% of the time, whether driving or as a passenger.  Use safety devices such as hearing protection if you work in environments with loud noise or significant background noise.  Use safety glasses when doing any work that could send debris in to the eyes.  Use a helmet if you ride a bike or motorcycle.  Use appropriate safety gear for contact sports.  Talk to your caregiver about gun safety.  Use sunscreen with a SPF (  or skin protection factor) of 15 or greater.  Lighter skinned people are at a greater risk of skin cancer. Don't forget to also wear sunglasses in order to protect your eyes from too much damaging sunlight. Damaging sunlight can accelerate cataract formation.   Practice safe sex. Use condoms. Condoms are used for birth control and to help reduce the spread of sexually transmitted infections (or STIs).  Some of the STIs are gonorrhea (the clap), chlamydia, syphilis, trichomonas, herpes, HPV (human papilloma virus) and HIV (human immunodeficiency virus) which causes AIDS. The herpes, HIV and HPV are viral illnesses that have no cure. These can result in disability, cancer and death.   Keep carbon monoxide and smoke detectors in your home functioning at all times. Change the batteries every 6 months or use a model that plugs into the wall.   Vaccinations:  Stay up to date with  your tetanus shots and other required immunizations. You should have a booster for tetanus every 10 years. Be sure to get your flu shot every year, since 5%-20% of the [place] population comes down with the flu. The flu vaccine changes each year, so being vaccinated once is not enough. Get your shot in the fall, before the flu season peaks.   Other vaccines to consider:  Human Papilloma Virus or HPV causes cancer of the cervix, and other infections that can be transmitted from person to person. There is a vaccine for HPV, and females should get immunized between the ages of 54 and 17. It requires a series of 3 shots.   Pneumococcal vaccine to protect against certain types of pneumonia.  This is normally recommended for adults age 67 or older.  However, adults younger than 53 years old with certain underlying conditions such as diabetes, heart or lung disease should also receive the vaccine.  Shingles vaccine to protect against Varicella Zoster if you are older than age 52, or younger than 53 years old with certain underlying illness.  Hepatitis A vaccine to protect against a form of infection of the liver by a virus acquired from food.  Hepatitis B vaccine to protect against a form of infection of the liver by a virus acquired from blood or body fluids, particularly if you work in health care.  If you plan to travel internationally, check with your local health department for specific vaccination recommendations.  Cancer Screening:  Breast cancer screening is essential to preventive care for women. All women age 38 and older should perform a breast self-exam every month. At age 2 and older, women should have their caregiver complete a breast exam each year. Women at ages 3 and older should have a mammogram (x-ray film) of the breasts. Your caregiver can discuss how often you need mammograms.    Cervical cancer screening includes taking a Pap smear (sample of cells examined under a microscope) from  the cervix (end of the uterus). It also includes testing for HPV (Human Papilloma Virus, which can cause cervical cancer). Screening and a pelvic exam should begin at age 77, or 3 years after a woman becomes sexually active. Screening should occur every year, with a Pap smear but no HPV testing, up to age 60. After age 39, you should have a Pap smear every 3 years with HPV testing, if no HPV was found previously.   Most routine colon cancer screening begins at the age of 58. On a yearly basis, doctors may provide special easy to use take-home tests to check for hidden blood  in the stool. Sigmoidoscopy or colonoscopy can detect the earliest forms of colon cancer and is life saving. These tests use a small camera at the end of a tube to directly examine the colon. Speak to your caregiver about this at age 74, when routine screening begins (and is repeated every 5 years unless early forms of pre-cancerous polyps or small growths are found).

## 2016-05-12 ENCOUNTER — Encounter: Payer: Self-pay | Admitting: Internal Medicine

## 2016-05-13 ENCOUNTER — Encounter: Payer: Self-pay | Admitting: Gastroenterology

## 2016-05-14 ENCOUNTER — Encounter: Payer: Self-pay | Admitting: Family Medicine

## 2016-05-15 ENCOUNTER — Other Ambulatory Visit: Payer: Self-pay | Admitting: Family Medicine

## 2016-05-15 ENCOUNTER — Encounter: Payer: Self-pay | Admitting: Internal Medicine

## 2016-05-15 ENCOUNTER — Encounter: Payer: Self-pay | Admitting: Family Medicine

## 2016-05-15 MED ORDER — GLUCOSE BLOOD VI STRP: ORAL_STRIP | 2 refills | Status: DC

## 2016-05-15 NOTE — Telephone Encounter (Signed)
Mickel Baas, needs a prior auth for test strips

## 2016-05-20 NOTE — Telephone Encounter (Signed)
Switched to another test strips

## 2016-05-21 ENCOUNTER — Encounter: Payer: BLUE CROSS/BLUE SHIELD | Attending: Family Medicine | Admitting: Dietician

## 2016-05-21 ENCOUNTER — Encounter: Payer: Self-pay | Admitting: Dietician

## 2016-05-21 DIAGNOSIS — E119 Type 2 diabetes mellitus without complications: Secondary | ICD-10-CM | POA: Diagnosis not present

## 2016-05-21 DIAGNOSIS — Z713 Dietary counseling and surveillance: Secondary | ICD-10-CM | POA: Diagnosis not present

## 2016-05-21 DIAGNOSIS — Z6830 Body mass index (BMI) 30.0-30.9, adult: Secondary | ICD-10-CM | POA: Insufficient documentation

## 2016-05-21 DIAGNOSIS — E118 Type 2 diabetes mellitus with unspecified complications: Secondary | ICD-10-CM

## 2016-05-21 DIAGNOSIS — E669 Obesity, unspecified: Secondary | ICD-10-CM | POA: Insufficient documentation

## 2016-05-21 NOTE — Progress Notes (Signed)
Patient was seen on 05/21/16 for the first of a series of three diabetes self-management courses at the [facility].  Patient Education Plan per assessed needs and concerns is to attend four course education program for Diabetes Self Management Education.  The following learning objectives were met by the patient during this class:  Describe diabetes  State some common risk factors for diabetes  Defines the role of glucose and insulin  Identifies type of diabetes and pathophysiology  Describe the relationship between diabetes and cardiovascular risk  State the members of the Healthcare Team  States the rationale for glucose monitoring  State when to test glucose  State their individual Target Range  State the importance of logging glucose readings  Describe how to interpret glucose readings  Identifies A1C target  Explain the correlation between A1c and eAG values  State symptoms and treatment of high blood glucose  State symptoms and treatment of low blood glucose  Explain proper technique for glucose testing  Identifies proper sharps disposal  Handouts given during class include:  Living Well with Diabetes book  Carb Counting and Meal Planning book  Meal Plan Card  Carbohydrate guide  Meal planning worksheet  Low Sodium Flavoring Tips  The diabetes portion plate  Q7V to eAG Conversion Chart  Diabetes Medications  Diabetes Recommended Care Schedule  Support Group  Diabetes Success Plan  Core Class Satisfaction Survey  Follow-Up Plan:  Attend core 2

## 2016-05-25 DIAGNOSIS — M25559 Pain in unspecified hip: Secondary | ICD-10-CM | POA: Diagnosis not present

## 2016-05-25 DIAGNOSIS — Z79891 Long term (current) use of opiate analgesic: Secondary | ICD-10-CM | POA: Diagnosis not present

## 2016-05-25 DIAGNOSIS — G894 Chronic pain syndrome: Secondary | ICD-10-CM | POA: Diagnosis not present

## 2016-05-25 DIAGNOSIS — M545 Low back pain: Secondary | ICD-10-CM | POA: Diagnosis not present

## 2016-05-25 DIAGNOSIS — M79606 Pain in leg, unspecified: Secondary | ICD-10-CM | POA: Diagnosis not present

## 2016-05-25 DIAGNOSIS — Z79899 Other long term (current) drug therapy: Secondary | ICD-10-CM | POA: Diagnosis not present

## 2016-05-28 ENCOUNTER — Encounter: Payer: BLUE CROSS/BLUE SHIELD | Admitting: Dietician

## 2016-05-28 DIAGNOSIS — E119 Type 2 diabetes mellitus without complications: Secondary | ICD-10-CM | POA: Diagnosis not present

## 2016-05-28 DIAGNOSIS — E669 Obesity, unspecified: Secondary | ICD-10-CM | POA: Diagnosis not present

## 2016-05-28 DIAGNOSIS — Z713 Dietary counseling and surveillance: Secondary | ICD-10-CM | POA: Diagnosis not present

## 2016-05-28 DIAGNOSIS — Z6830 Body mass index (BMI) 30.0-30.9, adult: Secondary | ICD-10-CM | POA: Diagnosis not present

## 2016-05-28 NOTE — Progress Notes (Signed)

## 2016-06-03 DIAGNOSIS — M7060 Trochanteric bursitis, unspecified hip: Secondary | ICD-10-CM | POA: Diagnosis not present

## 2016-06-04 DIAGNOSIS — E669 Obesity, unspecified: Secondary | ICD-10-CM | POA: Diagnosis not present

## 2016-06-04 DIAGNOSIS — Z713 Dietary counseling and surveillance: Secondary | ICD-10-CM | POA: Diagnosis not present

## 2016-06-04 DIAGNOSIS — Z6830 Body mass index (BMI) 30.0-30.9, adult: Secondary | ICD-10-CM | POA: Diagnosis not present

## 2016-06-04 DIAGNOSIS — E119 Type 2 diabetes mellitus without complications: Secondary | ICD-10-CM | POA: Diagnosis not present

## 2016-06-04 DIAGNOSIS — E118 Type 2 diabetes mellitus with unspecified complications: Secondary | ICD-10-CM

## 2016-06-04 NOTE — Progress Notes (Signed)
Patient was seen on 06/04/16 for the third of a series of three diabetes self-management courses at the [facility].   [name] the amount of activity recommended for healthy living . Describe activities suitable for individual needs . Identify ways to regularly incorporate activity into daily life . Identify barriers to activity and ways to over come these barriers  Identify diabetes medications being personally used and their primary action for lowering glucose and possible side effects . Describe role of stress on blood glucose and develop strategies to address psychosocial issues . Identify diabetes complications and ways to prevent them  Explain how to manage diabetes during illness . Evaluate success in meeting personal goal . Establish 2-3 goals that they will plan to diligently work on until they return for the  73-month follow-up visit  Goals:   I will count my carb choices at most meals and snacks  I will be active 30 minutes or more 3 times a week  I will look at patterns in my record book at least 2 days a month  Your patient has identified these potential barriers to change:  Current health condition/pain  Your patient has identified their diabetes self-care support plan as  Family Support  Plan:  Attend Support Group as desired

## 2016-06-11 DIAGNOSIS — G5602 Carpal tunnel syndrome, left upper limb: Secondary | ICD-10-CM | POA: Diagnosis not present

## 2016-06-11 DIAGNOSIS — M654 Radial styloid tenosynovitis [de Quervain]: Secondary | ICD-10-CM | POA: Diagnosis not present

## 2016-06-19 ENCOUNTER — Ambulatory Visit (AMBULATORY_SURGERY_CENTER): Payer: Self-pay | Admitting: *Deleted

## 2016-06-19 VITALS — Ht 63.5 in | Wt 199.0 lb

## 2016-06-19 DIAGNOSIS — Z1211 Encounter for screening for malignant neoplasm of colon: Secondary | ICD-10-CM

## 2016-06-19 MED ORDER — NA SULFATE-K SULFATE-MG SULF 17.5-3.13-1.6 GM/177ML PO SOLN: 1.0000 | Freq: Once | ORAL | 0 refills | Status: AC

## 2016-06-19 NOTE — Progress Notes (Signed)
Denies allergies to eggs or soy products. Denies complications with sedation or anesthesia. Denies O2 use. Denies use of diet or weight loss medications.  Emmi instructions given for colonoscopy.  

## 2016-06-22 DIAGNOSIS — M79606 Pain in leg, unspecified: Secondary | ICD-10-CM | POA: Diagnosis not present

## 2016-06-22 DIAGNOSIS — Z79891 Long term (current) use of opiate analgesic: Secondary | ICD-10-CM | POA: Diagnosis not present

## 2016-06-22 DIAGNOSIS — Z79899 Other long term (current) drug therapy: Secondary | ICD-10-CM | POA: Diagnosis not present

## 2016-06-22 DIAGNOSIS — M25559 Pain in unspecified hip: Secondary | ICD-10-CM | POA: Diagnosis not present

## 2016-06-22 DIAGNOSIS — M545 Low back pain: Secondary | ICD-10-CM | POA: Diagnosis not present

## 2016-06-22 DIAGNOSIS — G894 Chronic pain syndrome: Secondary | ICD-10-CM | POA: Diagnosis not present

## 2016-06-23 ENCOUNTER — Encounter: Payer: Self-pay | Admitting: Gastroenterology

## 2016-07-02 ENCOUNTER — Ambulatory Visit
Admission: RE | Admit: 2016-07-02 | Discharge: 2016-07-02 | Disposition: A | Discharge status: Home / Self Care | Payer: BLUE CROSS/BLUE SHIELD | Source: Ambulatory Visit | Attending: Family Medicine | Admitting: Family Medicine

## 2016-07-02 DIAGNOSIS — Z1231 Encounter for screening mammogram for malignant neoplasm of breast: Secondary | ICD-10-CM | POA: Diagnosis not present

## 2016-07-02 DIAGNOSIS — Z1239 Encounter for other screening for malignant neoplasm of breast: Secondary | ICD-10-CM

## 2016-07-03 ENCOUNTER — Ambulatory Visit (AMBULATORY_SURGERY_CENTER): Payer: BLUE CROSS/BLUE SHIELD | Admitting: Gastroenterology

## 2016-07-03 ENCOUNTER — Encounter: Payer: Self-pay | Admitting: Gastroenterology

## 2016-07-03 VITALS — BP 134/77 | HR 80 | Temp 97.1°F | Resp 14 | Ht 63.5 in | Wt 199.0 lb

## 2016-07-03 DIAGNOSIS — D120 Benign neoplasm of cecum: Secondary | ICD-10-CM | POA: Diagnosis not present

## 2016-07-03 DIAGNOSIS — D122 Benign neoplasm of ascending colon: Secondary | ICD-10-CM | POA: Diagnosis not present

## 2016-07-03 DIAGNOSIS — Z1211 Encounter for screening for malignant neoplasm of colon: Secondary | ICD-10-CM

## 2016-07-03 DIAGNOSIS — Z1212 Encounter for screening for malignant neoplasm of rectum: Secondary | ICD-10-CM | POA: Diagnosis not present

## 2016-07-03 DIAGNOSIS — D123 Benign neoplasm of transverse colon: Secondary | ICD-10-CM | POA: Diagnosis not present

## 2016-07-03 MED ORDER — SODIUM CHLORIDE 0.9 % IV SOLN: 500.0000 mL | INTRAVENOUS | Status: DC

## 2016-07-03 NOTE — Progress Notes (Signed)
Called to room to assist during endoscopic procedure.  Patient ID and intended procedure confirmed with present staff. Received instructions for my participation in the procedure from the performing physician.  

## 2016-07-03 NOTE — Progress Notes (Signed)
Pt's states no medical or surgical changes since previsit or office visit.  Maw

## 2016-07-03 NOTE — Op Note (Signed)
[name]: [name] Procedure Date: 07/03/2016 11:35 AM MRN: [id number] Endoscopist: [place]. Loletha Carrow , MD Age: 51 Referring MD:  Date of Birth: [birth year] Gender: Female Account #: [id number] Procedure:                Colonoscopy Indications:              Screening for colorectal malignant neoplasm, This                            is the patient's first colonoscopy Medicines:                Monitored Anesthesia Care Procedure:                Pre-Anesthesia Assessment:                           - Prior to the procedure, a History and Physical                            was performed, and patient medications and                            allergies were reviewed. The patient's tolerance of                            previous anesthesia was also reviewed. The risks                            and benefits of the procedure and the sedation                            options and risks were discussed with the patient.                            All questions were answered, and informed consent                            was obtained. Prior Anticoagulants: The patient has                            taken no previous anticoagulant or antiplatelet                            agents. ASA Grade Assessment: II - A patient with                            mild systemic disease. After reviewing the risks                            and benefits, the patient was deemed in                            satisfactory condition to undergo the procedure.  After obtaining informed consent, the colonoscope                            was passed under direct vision. Throughout the                            procedure, the patient's blood pressure, pulse, and                            oxygen saturations were monitored continuously. The                            Colonoscope was introduced through the anus and                            advanced to the the cecum,  identified by                            appendiceal orifice and ileocecal valve. The                            colonoscopy was performed without difficulty. The                            patient tolerated the procedure well. The quality                            of the bowel preparation was good. The ileocecal                            valve, appendiceal orifice, and rectum were                            photographed. Scope In: 11:44:48 AM Scope Out: 12:04:06 PM Scope Withdrawal Time: 0 hours 14 minutes 48 seconds  Total Procedure Duration: 0 hours 19 minutes 18 seconds  Findings:                 The perianal and digital rectal examinations were                            normal.                           Four sessile polyps were found in the hepatic                            flexure and cecum. The polyps were 1 to 4 mm in                            size. These polyps were removed with a cold biopsy                            forceps. Resection and retrieval were complete.  A 6 mm polyp was found in the mid transverse colon.                            The polyp was sessile. The polyp was removed with a                            cold snare. Resection and retrieval were complete.                           The exam was otherwise without abnormality on                            direct and retroflexion views. Complications:            No immediate complications. Estimated Blood Loss:     Estimated blood loss: none. Impression:               - Four 1 to 4 mm polyps at the hepatic flexure and                            in the cecum, removed with a cold biopsy forceps.                            Resected and retrieved.                           - One 6 mm polyp in the mid transverse colon,                            removed with a cold snare. Resected and retrieved.                           - The examination was otherwise normal on direct                             and retroflexion views. Recommendation:           - Patient has a contact number available for                            emergencies. The signs and symptoms of potential                            delayed complications were discussed with the                            patient. Return to normal activities tomorrow.                            Written discharge instructions were provided to the                            patient.                           -  Resume previous diet.                           - Continue present medications.                           - Await pathology results.                           - Repeat colonoscopy is recommended for                            surveillance. The colonoscopy date will be                            determined after pathology results from today's                            exam become available for review. Aviyana Sonntag L. Loletha Carrow, MD 07/03/2016 12:07:42 PM This report has been signed electronically.

## 2016-07-03 NOTE — Patient Instructions (Signed)
YOU HAD AN ENDOSCOPIC PROCEDURE TODAY AT [facility]:   Refer to the procedure report that was given to you for any specific questions about what was found during the examination.  If the procedure report does not answer your questions, please call your gastroenterologist to clarify.  If you requested that your care partner not be given the details of your procedure findings, then the procedure report has been included in a sealed envelope for you to review at your convenience later.  YOU SHOULD EXPECT: Some feelings of bloating in the abdomen. Passage of more gas than usual.  Walking can help get rid of the air that was put into your GI tract during the procedure and reduce the bloating. If you had a lower endoscopy (such as a colonoscopy or flexible sigmoidoscopy) you may notice spotting of blood in your stool or on the toilet paper. If you underwent a bowel prep for your procedure, you may not have a normal bowel movement for a few days.  Please Note:  You might notice some irritation and congestion in your nose or some drainage.  This is from the oxygen used during your procedure.  There is no need for concern and it should clear up in a day or so.  SYMPTOMS TO REPORT IMMEDIATELY:   Following lower endoscopy (colonoscopy or flexible sigmoidoscopy):  Excessive amounts of blood in the stool  Significant tenderness or worsening of abdominal pains  Swelling of the abdomen that is new, acute  Fever of 100F or higher    For urgent or emergent issues, a gastroenterologist can be reached at any hour by calling [phone].   DIET:  We do recommend a small meal at first, but then you may proceed to your regular diet.  Drink plenty of fluids but you should avoid alcoholic beverages for 24 hours.  ACTIVITY:  You should plan to take it easy for the rest of today and you should NOT DRIVE or use heavy machinery until tomorrow (because of the sedation medicines used during the test).     FOLLOW UP: Our staff will call the number listed on your records the next business day following your procedure to check on you and address any questions or concerns that you may have regarding the information given to you following your procedure. If we do not reach you, we will leave a message.  However, if you are feeling well and you are not experiencing any problems, there is no need to return our call.  We will assume that you have returned to your regular daily activities without incident.  If any biopsies were taken you will be contacted by phone or by letter within the next 1-3 weeks.  Please call us at [phone] if you have not heard about the biopsies in 3 weeks.   Polyps (handout given) Await for biopsy results to determined next repeat Colonoscopy screening  SIGNATURES/CONFIDENTIALITY: You and/or your care partner have signed paperwork which will be entered into your electronic medical record.  These signatures attest to the fact that that the information above on your After Visit Summary has been reviewed and is understood.  Full responsibility of the confidentiality of this discharge information lies with you and/or your care-partner.

## 2016-07-06 ENCOUNTER — Telehealth: Payer: Self-pay | Admitting: *Deleted

## 2016-07-06 NOTE — Telephone Encounter (Signed)
  Follow up Call-  Call back number 07/03/2016  Post procedure Call Back phone  # [phone] cell  Permission to leave phone message Yes  Some recent data might be hidden     Patient questions:  Do you have a fever, pain , or abdominal swelling? No. Pain Score  0 *  Have you tolerated food without any problems? Yes.    Have you been able to return to your normal activities? Yes.    Do you have any questions about your discharge instructions: Diet   No. Medications  No. Follow up visit  No.  Do you have questions or concerns about your Care? No.  Actions: * If pain score is 4 or above: No action needed, pain <4.

## 2016-07-09 ENCOUNTER — Encounter: Payer: Self-pay | Admitting: Gastroenterology

## 2016-07-09 DIAGNOSIS — M654 Radial styloid tenosynovitis [de Quervain]: Secondary | ICD-10-CM | POA: Diagnosis not present

## 2016-07-13 ENCOUNTER — Encounter: Payer: Self-pay | Admitting: Family Medicine

## 2016-07-13 DIAGNOSIS — D369 Benign neoplasm, unspecified site: Secondary | ICD-10-CM | POA: Insufficient documentation

## 2016-07-21 DIAGNOSIS — M25559 Pain in unspecified hip: Secondary | ICD-10-CM | POA: Diagnosis not present

## 2016-07-21 DIAGNOSIS — G894 Chronic pain syndrome: Secondary | ICD-10-CM | POA: Diagnosis not present

## 2016-07-21 DIAGNOSIS — M25539 Pain in unspecified wrist: Secondary | ICD-10-CM | POA: Diagnosis not present

## 2016-07-21 DIAGNOSIS — Z79891 Long term (current) use of opiate analgesic: Secondary | ICD-10-CM | POA: Diagnosis not present

## 2016-07-21 DIAGNOSIS — Z79899 Other long term (current) drug therapy: Secondary | ICD-10-CM | POA: Diagnosis not present

## 2016-07-21 DIAGNOSIS — M79606 Pain in leg, unspecified: Secondary | ICD-10-CM | POA: Diagnosis not present

## 2016-08-09 NOTE — Progress Notes (Signed)
Subjective:    Patient ID: [name], female    DOB: 01-26-1966, 51 y.o.   MRN: [id number]  Chief Complaint  Patient presents with  . Diabetes    diabetes, no other concerns    [name] is a 51 y.o. female who presents for follow-up of Type 2 diabetes mellitus. Prior to January 2018 she was in the prediabetes range. Her Hgb A1c in January was 6.8%. She requested to try lifestyle modifications instead of starting on medication. States she has done will with this.  She did see the nutritionist as scheduled.   Her labs in January also showed an elevated LDL and low vitamin D level.  She has been taking a vitamin D supplement.   Patient is checking home blood sugars.   Home blood sugar records: BGs range between 103 and 147 daily average on her meter is 125. With an average of  How often is blood sugars being checked: daily  Current symptoms include: none. Patient denies hyperglycemia, hypoglycemia , increased appetite, nausea, paresthesia of the feet, polydipsia, polyuria, visual disturbances and vomiting.  Patient is checking their feet daily. Any Foot concerns (callous, ulcer, wound, thickened nails, toenail fungus, skin fungus, hammer toe): none Last dilated eye exam: years. Plans to schedule.   Current treatments: more intensive attention to diet which has been somewhat effective. Medication compliance: good  Current diet: in general, a "healthy" diet   Current exercise: cardiovascular workout on exercise equipment Known diabetic complications: none  The following portions of the patient's history were reviewed and updated as appropriate: allergies, current medications, past medical history, past social history and problem list.  ROS as in subjective above.     Objective:    Physical Exam Alert and in no distress otherwise not examined.  Blood pressure 112/70, pulse 87, weight 196 lb 6.4 oz (89.1 kg).  Lab Review Diabetic Labs Latest Ref Rng & Units 08/10/2016  05/05/2016 06/11/2015 06/13/2008 06/12/2008  HbA1c - 6.7% 6.8(H) - - -  Chol <200 mg/dL - 201(H) - 176        ATP III CLASSIFICATION:  <200     mg/dL   Desirable  200-239  mg/dL   Borderline High  >=240    mg/dL   High        -  HDL >50 mg/dL - 59 - 42 -  Calc LDL <100 mg/dL - 116(H) - 113        Total Cholesterol/HDL:CHD Risk Coronary Heart Disease Risk Table                     Men   Women  1/2 Average Risk   3.4   3.3  Average Risk       5.0   4.4  2 X Average Risk   9.6   7.1  3 X Average Risk  23.4   11.0        Use the calculated Patient Ratio above and the CHD Risk Table to determine the patient's CHD Risk.        ATP III CLASSIFICATION (LDL):  <100     mg/dL   Optimal  100-129  mg/dL   Near or Above                    Optimal  130-159  mg/dL   Borderline  160-189  mg/dL   High  >190     mg/dL   Very High(H) -  Triglycerides <150 mg/dL -  131 - 107 -  Creatinine 0.50 - 1.05 mg/dL - 0.92 0.81 0.93 1.1   BP/Weight 08/10/2016 07/03/2016 06/19/2016 05/11/2016 1/82/9937  Systolic BP 169 678 - 938 101  Diastolic BP 70 77 - 80 82  Wt. (Lbs) 196.4 199 199 200.2 199.8  BMI 34.24 34.7 34.7 34.91 -   No flowsheet data found.  [name]  reports that she has never smoked. She has never used smokeless tobacco. She reports that she does not drink alcohol or use drugs.     Assessment & Plan:    New onset type 2 diabetes mellitus ([place]) - Plan: HgB A1c, Microalbumin / creatinine urine ratio  Class 1 obesity due to excess calories without serious comorbidity with body mass index (BMI) of 34.0 to 34.9 in adult  Elevated LDL cholesterol level  1. Rx changes: start statin.  hemoglobin A1c 6.7% which is down from 6.8% 3 months ago.  2. Education: Reviewed 'ABCs' of diabetes management (respective goals in parentheses):  A1C (<7), blood pressure (<130/80), and cholesterol (LDL <100). 3. Compliance at present is estimated to be good. Efforts to improve compliance (if necessary) will be  directed at dietary modifications: continue to eat fewer carbohydrates and sugary foods and eat more vegetables and whole grains, increased exercise and regular blood sugar monitoring: daily. 4. Follow up: 4 months for Diabetes check. Follow up for fasting labs in 6-8 weeks after starting statin.  5. Will check microalbumin/creatinine urine ratio. Discussed option of starting medication for diabetes and she prefers to hold off since her Hgb A1c is <7. She would be ok starting a once weekly injection to help with blood sugars and potentially weight loss. Discussed starting an ace inhibitor if microalbumin is elevated but her BP is low normal at this point so I am cautious to add an Ace inhibitor if microalbumin/creatinine urine ratio is normal.

## 2016-08-10 ENCOUNTER — Encounter: Payer: Self-pay | Admitting: Family Medicine

## 2016-08-10 ENCOUNTER — Ambulatory Visit (INDEPENDENT_AMBULATORY_CARE_PROVIDER_SITE_OTHER): Payer: BLUE CROSS/BLUE SHIELD | Admitting: Family Medicine

## 2016-08-10 VITALS — BP 112/70 | HR 87 | Wt 196.4 lb

## 2016-08-10 DIAGNOSIS — E119 Type 2 diabetes mellitus without complications: Secondary | ICD-10-CM | POA: Diagnosis not present

## 2016-08-10 DIAGNOSIS — Z6834 Body mass index (BMI) 34.0-34.9, adult: Secondary | ICD-10-CM

## 2016-08-10 DIAGNOSIS — E7800 Pure hypercholesterolemia, unspecified: Secondary | ICD-10-CM | POA: Diagnosis not present

## 2016-08-10 DIAGNOSIS — E6609 Other obesity due to excess calories: Secondary | ICD-10-CM

## 2016-08-10 LAB — POCT GLYCOSYLATED HEMOGLOBIN (HGB A1C): Hemoglobin A1C: 6.7

## 2016-08-10 MED ORDER — SIMVASTATIN 20 MG PO TABS: 20.0000 mg | ORAL_TABLET | Freq: Every day | ORAL | 2 refills | Status: DC

## 2016-08-10 NOTE — Patient Instructions (Signed)
Your hemoglobin A1c is 6.7%.  Start the cholesterol medication as discussed. Return in 6-8 weeks for fasting cholesterol check. This is a lab visit.    Preventing High Cholesterol Cholesterol is a waxy, fat-like substance that your body needs in small amounts. Your liver makes all the cholesterol that your body needs. Having high cholesterol (hypercholesterolemia) increases your risk for heart disease and stroke. Extra (excess) cholesterol comes from the food you eat, such as animal-based fat (saturated fat) from meat and some dairy products. High cholesterol can often be prevented with diet and lifestyle changes. If you already have high cholesterol, you can control it with diet and lifestyle changes, as well as medicine. What nutrition changes can be made?  Eat less saturated fat. Foods that contain saturated fat include red meat and some dairy products.  Avoid processed meats, like bacon and lunch meats.  Avoid trans fats, which are found in margarine and some baked goods.  Avoid foods and beverages that have added sugars.  Eat more fruits, vegetables, and whole grains.  Choose healthy sources of protein, such as fish, poultry, and nuts.  Choose healthy sources of fat, such as:  Nuts.  Vegetable oils, especially olive oil.  Fish that have healthy fats (omega-3 fatty acids), such as mackerel or salmon. What lifestyle changes can be made?  Lose weight if you are overweight. Losing 5-10 lb (2.3-4.5 kg) can help prevent or control high cholesterol and reduce your risk for diabetes and high blood pressure. Ask your health care provider to help you with a diet and exercise plan to safely lose weight.  Get enough exercise. Do at least 150 minutes of moderate-intensity exercise each week.  You could do this in short exercise sessions several times a day, or you could do longer exercise sessions a few times a week. For example, you could take a brisk 10-minute walk or bike ride, 3 times a  day, for 5 days a week.  Do not smoke. If you need help quitting, ask your health care provider.  Limit your alcohol intake. If you drink alcohol, limit alcohol intake to no more than 1 drink a day for nonpregnant women and 2 drinks a day for men. One drink equals 12 oz of beer, 5 oz of wine, or 1 oz of hard liquor. Why are these changes important? If you have high cholesterol, deposits (plaques) may build up on the walls of your blood vessels. Plaques make the arteries narrower and stiffer, which can restrict or block blood flow and cause blood clots to form. This greatly increases your risk for heart attack and stroke. Making diet and lifestyle changes can reduce your risk for these life-threatening conditions. What can I do to lower my risk?  Manage your risk factors for high cholesterol. Talk with your health care provider about all of your risk factors and how to lower your risk.  Manage other conditions that you have, such as diabetes or high blood pressure (hypertension).  Have your cholesterol checked at regular intervals.  Keep all follow-up visits as told by your health care provider. This is important. How is this treated? In addition to diet and lifestyle changes, your health care provider may recommend medicines to help lower cholesterol, such as a medicine to reduce the amount of cholesterol made in your liver. You may need medicine if:  Diet and lifestyle changes do not lower your cholesterol enough.  You have high cholesterol and other risk factors for heart disease or stroke. Take  over-the-counter and prescription medicines only as told by your health care provider. Where to find more information:  American Heart Association: ThisTune.com.pt.jsp  National Heart, Lung, and Blood Institute: FrenchToiletries.com.cy Summary  High cholesterol increases your risk  for heart disease and stroke. By keeping your cholesterol level low, you can reduce your risk for these conditions.  Diet and lifestyle changes are the most important steps in preventing high cholesterol.  Work with your health care provider to manage your risk factors, and have your blood tested regularly. This information is not intended to replace advice given to you by your health care provider. Make sure you discuss any questions you have with your health care provider. Document Released: 04/21/2015 Document Revised: 12/14/2015 Document Reviewed: 12/14/2015 Elsevier Interactive Patient Education  2017 [employer].

## 2016-08-11 LAB — MICROALBUMIN / CREATININE URINE RATIO
Creatinine, Urine: 200 mg/dL (ref 20–320)
Microalb Creat Ratio: 2 mcg/mg creat (ref <30)
Microalb, Ur: 0.3 mg/dL

## 2016-08-18 DIAGNOSIS — M25539 Pain in unspecified wrist: Secondary | ICD-10-CM | POA: Diagnosis not present

## 2016-08-18 DIAGNOSIS — G894 Chronic pain syndrome: Secondary | ICD-10-CM | POA: Diagnosis not present

## 2016-08-18 DIAGNOSIS — M79606 Pain in leg, unspecified: Secondary | ICD-10-CM | POA: Diagnosis not present

## 2016-08-18 DIAGNOSIS — M25559 Pain in unspecified hip: Secondary | ICD-10-CM | POA: Diagnosis not present

## 2016-09-28 ENCOUNTER — Other Ambulatory Visit: Payer: BLUE CROSS/BLUE SHIELD

## 2016-09-28 DIAGNOSIS — E119 Type 2 diabetes mellitus without complications: Secondary | ICD-10-CM | POA: Diagnosis not present

## 2016-09-28 DIAGNOSIS — E7800 Pure hypercholesterolemia, unspecified: Secondary | ICD-10-CM | POA: Diagnosis not present

## 2016-09-28 LAB — LIPID PANEL
Cholesterol: 158 mg/dL (ref <200)
HDL: 58 mg/dL (ref >50)
LDL Cholesterol: 88 mg/dL (ref <100)
Total CHOL/HDL Ratio: 2.7 Ratio (ref <5.0)
Triglycerides: 62 mg/dL (ref <150)
VLDL: 12 mg/dL (ref <30)

## 2016-10-02 DIAGNOSIS — M545 Low back pain: Secondary | ICD-10-CM | POA: Diagnosis not present

## 2016-10-02 DIAGNOSIS — M79606 Pain in leg, unspecified: Secondary | ICD-10-CM | POA: Diagnosis not present

## 2016-10-02 DIAGNOSIS — G894 Chronic pain syndrome: Secondary | ICD-10-CM | POA: Diagnosis not present

## 2016-10-02 DIAGNOSIS — M25539 Pain in unspecified wrist: Secondary | ICD-10-CM | POA: Diagnosis not present

## 2016-10-12 ENCOUNTER — Telehealth: Payer: Self-pay

## 2016-10-12 MED ORDER — SIMVASTATIN 20 MG PO TABS: 20.0000 mg | ORAL_TABLET | Freq: Every day | ORAL | 0 refills | Status: DC

## 2016-10-12 NOTE — Telephone Encounter (Signed)
done

## 2016-10-12 NOTE — Telephone Encounter (Signed)
Pt needs simvastatin [facility] day supply called [facility] [address]. [name]

## 2016-10-25 ENCOUNTER — Other Ambulatory Visit: Payer: Self-pay | Admitting: Family Medicine

## 2016-10-25 DIAGNOSIS — G894 Chronic pain syndrome: Secondary | ICD-10-CM

## 2016-10-26 NOTE — Telephone Encounter (Signed)
Ok to refill 

## 2016-10-26 NOTE — Telephone Encounter (Signed)
Is this okay to refill? 

## 2016-10-30 DIAGNOSIS — M545 Low back pain: Secondary | ICD-10-CM | POA: Diagnosis not present

## 2016-10-30 DIAGNOSIS — G894 Chronic pain syndrome: Secondary | ICD-10-CM | POA: Diagnosis not present

## 2016-10-30 DIAGNOSIS — M25559 Pain in unspecified hip: Secondary | ICD-10-CM | POA: Diagnosis not present

## 2016-10-30 DIAGNOSIS — M79606 Pain in leg, unspecified: Secondary | ICD-10-CM | POA: Diagnosis not present

## 2016-10-30 DIAGNOSIS — Z79899 Other long term (current) drug therapy: Secondary | ICD-10-CM | POA: Diagnosis not present

## 2016-10-30 DIAGNOSIS — Z79891 Long term (current) use of opiate analgesic: Secondary | ICD-10-CM | POA: Diagnosis not present

## 2016-11-26 DIAGNOSIS — Z79899 Other long term (current) drug therapy: Secondary | ICD-10-CM | POA: Diagnosis not present

## 2016-11-26 DIAGNOSIS — M545 Low back pain: Secondary | ICD-10-CM | POA: Diagnosis not present

## 2016-11-26 DIAGNOSIS — Z79891 Long term (current) use of opiate analgesic: Secondary | ICD-10-CM | POA: Diagnosis not present

## 2016-11-26 DIAGNOSIS — G894 Chronic pain syndrome: Secondary | ICD-10-CM | POA: Diagnosis not present

## 2016-11-26 DIAGNOSIS — M7060 Trochanteric bursitis, unspecified hip: Secondary | ICD-10-CM | POA: Diagnosis not present

## 2016-11-26 DIAGNOSIS — M79606 Pain in leg, unspecified: Secondary | ICD-10-CM | POA: Diagnosis not present

## 2016-11-26 DIAGNOSIS — M25559 Pain in unspecified hip: Secondary | ICD-10-CM | POA: Diagnosis not present

## 2016-12-07 ENCOUNTER — Encounter: Payer: Self-pay | Admitting: Family Medicine

## 2016-12-07 ENCOUNTER — Telehealth: Payer: Self-pay | Admitting: Family Medicine

## 2016-12-07 ENCOUNTER — Ambulatory Visit (INDEPENDENT_AMBULATORY_CARE_PROVIDER_SITE_OTHER): Payer: BLUE CROSS/BLUE SHIELD | Admitting: Family Medicine

## 2016-12-07 VITALS — BP 128/80 | HR 88 | Wt 198.2 lb

## 2016-12-07 DIAGNOSIS — E119 Type 2 diabetes mellitus without complications: Secondary | ICD-10-CM

## 2016-12-07 LAB — POCT GLYCOSYLATED HEMOGLOBIN (HGB A1C): Hemoglobin A1C: 6.8

## 2016-12-07 NOTE — Patient Instructions (Addendum)
Your hemoglobin A1c today is 6.8%. Goal is <7%.   Call and check with your insurance regarding the once weekly injections. The class of medication is called a GLP-1 such as Trulicity, Bydureon, Ozempic. The once daily injection is called Victoza.   If you decide to not start on this then call me and we can start with an oral medication.   Continue eating healthy and cutting back on carbohydrates and sugars. Increase your physical activity level to 3-4 days per week of brisk walking.   I will see you back in 3-4 months for diabetes depending on what you decide to do in regards to starting medication or not.  You will be due for your physical in early February.

## 2016-12-07 NOTE — Telephone Encounter (Signed)
Pt is coming in Wednesday morning. Please let me know how patient is suppose to take meds since meds has 2 different doses.

## 2016-12-07 NOTE — Progress Notes (Signed)
Subjective:    Patient ID: [name], female    DOB: 1966-03-03, 51 y.o.   MRN: [id number]  [name] is a 51 y.o. female who presents for follow-up of Type 2 diabetes mellitus.  States she is taking vitamin D 800 IU daily.  Doing well on simvastatin and cholesterol had improved in June.   No concerns or complaints today.   Patient is checking home blood sugars.   Home blood sugar records: 135-165 How often is blood sugars being checked: once daily  Current symptoms include: none. Patient denies foot ulcerations, hyperglycemia, hypoglycemia , increased appetite, nausea, paresthesia of the feet, polydipsia, polyuria, visual disturbances and vomiting.  Patient is checking their feet daily. Any Foot concerns (callous, ulcer, wound, thickened nails, toenail fungus, skin fungus, hammer toe): none  Last dilated eye exam: she has not had her eye exam.   Current treatments: more intensive attention to diet which has been effective. Medication compliance: good  Current diet: in general, a "healthy" diet   Current exercise: walking 1-2 times per week Known diabetic complications: none  The following portions of the patient's history were reviewed and updated as appropriate: allergies, current medications, past medical history, past social history and problem list.  ROS as in subjective above.     Objective:    Physical Exam Alert and in no distress. Cardiac exam RRR, lungs CTA. Foot exam done and normal.   Blood pressure 128/80, pulse 88, weight 198 lb 3.2 oz (89.9 kg).  Lab Review Diabetic Labs Latest Ref Rng & Units 12/07/2016 09/28/2016 08/10/2016 05/05/2016 06/11/2015  HbA1c - 6.8% - 6.7% 6.8(H) -  Microalbumin Not estab mg/dL - - 0.3 - -  Micro/Creat Ratio <30 mcg/mg creat - - 2 - -  Chol <200 mg/dL - 158 - 201(H) -  HDL >50 mg/dL - 58 - 59 -  Calc LDL <100 mg/dL - 88 - 116(H) -  Triglycerides <150 mg/dL - 62 - 131 -  Creatinine 0.50 - 1.05 mg/dL - - - 0.92 0.81    BP/Weight 12/07/2016 08/10/2016 07/03/2016 06/19/2016 07/30/8784  Systolic BP 767 209 470 - 962  Diastolic BP 80 70 77 - 80  Wt. (Lbs) 198.2 196.4 199 199 200.2  BMI 34.56 34.24 34.7 34.7 34.91   Foot/eye exam completion dates 12/07/2016  Foot Form Completion Done    [name]  reports that she has never smoked. She has never used smokeless tobacco. She reports that she does not drink alcohol or use drugs.     Assessment & Plan:    Controlled type 2 diabetes mellitus without complication, without long-term current use of insulin ([place]) - Plan: HgB A1c  1. Rx changes: consider starting on an oral or GPP-1. she prefers to do a once weekly injection. she will need to call her insurance to see about cost. she is opposed to adding an oral medication at this time.  2. Education: Reviewed 'ABCs' of diabetes management (respective goals in parentheses):  A1C (<7), blood pressure (<130/80), and cholesterol (LDL <100). 3. Compliance at present is estimated to be good. Efforts to improve compliance (if necessary) will be directed at dietary modifications: cut back on sugar and carbohydrates. , increased exercise and regular blood sugar monitoring: daily. 4. Follow up: 3 months for diabetes checked depending on whether she decides to start on medication for diabetes or not. She will call and let me know her decision.  5. She is doing well on statin and her lipid panel had improved in  June. Taking daily vitamin D 800 IU. Will recheck her fasting lipids and vitamin D at next appointment. Due for CPE in late Jan or early Feb.

## 2016-12-07 NOTE — Telephone Encounter (Signed)
Pt called and stated that the medication her ins will cover is Ozempic. [facility] uses [facility] [address] and can be reached at [phone].

## 2016-12-07 NOTE — Telephone Encounter (Signed)
Let's have her come in Wednesday or Thursday for a nurse visit and we will get you started with a 6 week sample of Ozempic and they can show you how to administer the injection. We can see how you do on the medication and then send in the medication to your pharmacy at that point.

## 2016-12-07 NOTE — Telephone Encounter (Signed)
She will start Ozempic at 0.25 mg subcutaneous once weekly x 4 weeks then increase to 0.5 mg once weekly. We will see how she is doing after one month at the 0.5 mg and decide to keep her there are not. Have her come in for an office visit after 4 weeks of starting the medication.   She will need to continue with daily blood sugar checks to see how the medication is working.

## 2016-12-09 ENCOUNTER — Other Ambulatory Visit: Payer: BLUE CROSS/BLUE SHIELD

## 2016-12-09 MED ORDER — SEMAGLUTIDE(0.25 OR 0.5MG/DOS) 2 MG/1.5ML ~~LOC~~ SOPN: 0.2500 mg | PEN_INJECTOR | SUBCUTANEOUS | 0 refills | Status: DC

## 2016-12-09 NOTE — Addendum Note (Signed)
Addended by: Minette Headland A on: 12/09/2016 11:15 AM   Modules accepted: Orders

## 2016-12-09 NOTE — Telephone Encounter (Signed)
Pt was notified of how to use med in office. Sample was given

## 2016-12-11 ENCOUNTER — Encounter: Payer: Self-pay | Admitting: Family Medicine

## 2016-12-11 DIAGNOSIS — H40033 Anatomical narrow angle, bilateral: Secondary | ICD-10-CM | POA: Diagnosis not present

## 2016-12-11 DIAGNOSIS — E119 Type 2 diabetes mellitus without complications: Secondary | ICD-10-CM | POA: Diagnosis not present

## 2016-12-11 LAB — HM DIABETES EYE EXAM

## 2016-12-17 DIAGNOSIS — G5600 Carpal tunnel syndrome, unspecified upper limb: Secondary | ICD-10-CM | POA: Diagnosis not present

## 2016-12-24 ENCOUNTER — Emergency Department (HOSPITAL_COMMUNITY): Payer: BLUE CROSS/BLUE SHIELD

## 2016-12-24 ENCOUNTER — Emergency Department (HOSPITAL_COMMUNITY)
Admission: EM | Admit: 2016-12-24 | Discharge: 2016-12-24 | Disposition: A | Discharge status: Home / Self Care | Payer: BLUE CROSS/BLUE SHIELD | Attending: Emergency Medicine | Admitting: Emergency Medicine

## 2016-12-24 ENCOUNTER — Encounter (HOSPITAL_COMMUNITY): Payer: Self-pay | Admitting: Emergency Medicine

## 2016-12-24 DIAGNOSIS — S098XXA Other specified injuries of head, initial encounter: Secondary | ICD-10-CM | POA: Diagnosis not present

## 2016-12-24 DIAGNOSIS — E119 Type 2 diabetes mellitus without complications: Secondary | ICD-10-CM | POA: Diagnosis not present

## 2016-12-24 DIAGNOSIS — F0781 Postconcussional syndrome: Secondary | ICD-10-CM | POA: Diagnosis not present

## 2016-12-24 DIAGNOSIS — Y939 Activity, unspecified: Secondary | ICD-10-CM | POA: Diagnosis not present

## 2016-12-24 DIAGNOSIS — W0110XA Fall on same level from slipping, tripping and stumbling with subsequent striking against unspecified object, initial encounter: Secondary | ICD-10-CM | POA: Insufficient documentation

## 2016-12-24 DIAGNOSIS — Y929 Unspecified place or not applicable: Secondary | ICD-10-CM | POA: Insufficient documentation

## 2016-12-24 DIAGNOSIS — Z79899 Other long term (current) drug therapy: Secondary | ICD-10-CM | POA: Diagnosis not present

## 2016-12-24 DIAGNOSIS — Y999 Unspecified external cause status: Secondary | ICD-10-CM | POA: Insufficient documentation

## 2016-12-24 DIAGNOSIS — R51 Headache: Secondary | ICD-10-CM | POA: Diagnosis not present

## 2016-12-24 MED ORDER — IBUPROFEN 200 MG PO TABS
600.0000 mg | ORAL_TABLET | Freq: Once | ORAL | Status: AC
  Administered 2016-12-24: 600 mg via ORAL
  Filled 2016-12-24: qty 3

## 2016-12-24 MED ORDER — ACETAMINOPHEN 325 MG PO TABS
650.0000 mg | ORAL_TABLET | Freq: Once | ORAL | Status: AC
  Administered 2016-12-24: 650 mg via ORAL
  Filled 2016-12-24: qty 2

## 2016-12-24 MED ORDER — IBUPROFEN 600 MG PO TABS: 600.0000 mg | ORAL_TABLET | Freq: Four times a day (QID) | ORAL | 0 refills | Status: DC | PRN

## 2016-12-24 NOTE — ED Triage Notes (Signed)
Pt reports she hit her head on Tuesday when she tripped and fell. Had a HA Tuesday night and Wednesday morning. Also complains of pain on both sides of neck. Pt has been told her personality has changed since hitting her head. No LOC. Pt alert and oriented in triage.

## 2016-12-24 NOTE — Discharge Instructions (Signed)
Please read and follow all provided instructions.  Your diagnoses today include:  1. Post concussive syndrome     Tests performed today include: Vital signs. See below for your results today.   Medications prescribed:  Take as prescribed   Home care instructions:  Follow any educational materials contained in this packet.  Follow-up instructions: Please follow-up with your primary care provider for further evaluation of symptoms and treatment   Return instructions:  Please return to the Emergency Department if you do not get better, if you get worse, or new symptoms OR  - Fever (temperature greater than 101.34F)  - Bleeding that does not stop with holding pressure to the area    -Severe pain (please note that you may be more sore the day after your accident)  - Chest Pain  - Difficulty breathing  - Severe nausea or vomiting  - Inability to tolerate food and liquids  - Passing out  - Skin becoming red around your wounds  - Change in mental status (confusion or lethargy)  - New numbness or weakness    Please return if you have any other emergent concerns.  Additional Information:  Your vital signs today were: BP 131/82 (BP Location: Right Arm)    Pulse 82    Temp 98 F (36.7 C) (Oral)    Resp 18    SpO2 100%  If your blood pressure (BP) was elevated above 135/85 this visit, please have this repeated by your doctor within one month. ---------------

## 2016-12-24 NOTE — ED Provider Notes (Signed)
[facility] DEPT Provider Note   CSN: [id number] Arrival date & time: 12/24/16  1524     History   Chief Complaint Chief Complaint  Patient presents with  . Head Injury    HPI [name] is a 51 y.o. female.  HPI  51 y.o. female, presents to the Emergency Department today due to head injury on Tuesday. Pt states that she tripped and fell. Notes headache Tuesday night and Wednesday morning. States headache worsening. States headache on right side where impact was located. No N/V. Rates pain 8/10. Intermittent. No N/V. No visual changes. Does endorse mild photophobia and phonophobia. Staff at work noted change in mentation with increase memory loss and drowsiness. No numbness/tingling. No unilateral weakness. No fevers. No neck stiffness. No slurring of speech. No meds PTA. No other symptoms noted.     Past Medical History:  Diagnosis Date  . Arthritis   . Bursitis   . Carpal tunnel syndrome   . Chronic headaches   . Constipation   . Depression   . Diabetes ([place])    diet controlled, no meds  . Fibromyalgia   . Insomnia   . Snoring   . Tendonitis   . TMJ (dislocation of temporomandibular joint)     Patient Active Problem List   Diagnosis Date Noted  . Elevated LDL cholesterol level 08/10/2016  . Adenomatous polyps 07/13/2016  . Obesity 05/11/2016  . New onset type 2 diabetes mellitus ([place]) 05/07/2016  . Leg cramp 06/25/2015  . Arthritis, senescent 06/25/2015  . Hip bursitis 06/25/2015  . 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 Chronic pain syndrome 06/25/2015  . Muscle pain, fibromyalgia 06/25/2015  . Fibromyalgia 06/17/2015    Past Surgical History:  Procedure Laterality Date  . ABDOMINAL HYSTERECTOMY    . CESAREAN SECTION    . cyst from hand    . DENTAL SURGERY    . KNEE SURGERY     right  . TEMPOROMANDIBULAR JOINT SURGERY    . TONSILLECTOMY      OB History    No data available       Home Medications    Prior to Admission medications   Medication  Sig Start Date End Date Taking? Authorizing Provider  Cholecalciferol (VITAMIN D PO) Take 2 tablets by mouth.    [provider]  DULoxetine (CYMBALTA) 60 MG capsule TAKE 1 CAPSULE (60 MG TOTAL) BY MOUTH DAILY. 10/26/16   Henson, Vickie L, NP-C  glucose blood test strip Test once daily.  Pt uses Contour next meter 05/15/16   Henson, Vickie L, NP-C  linaclotide (LINZESS) 145 MCG CAPS capsule Take 145 mcg by mouth daily before breakfast.    [provider]  Morphine-Naltrexone (EMBEDA) 50-2 MG CPCR Take 1 tablet by mouth at bedtime.    [provider]  Jonetta Speak LANCETS FINE MISC Test once daily. Has one touch verio meter 05/11/16   Henson, Vickie L, NP-C  Semaglutide (OZEMPIC) 0.25 or 0.5 MG/DOSE SOPN Inject 0.25 mg into the skin once a week. Inject 0.25mg  once weekly for 4 weeks then increase to 0.5mg  weekly 12/09/16   Henson, Vickie L, NP-C  simvastatin (ZOCOR) 20 MG tablet Take 1 tablet (20 mg total) by mouth at bedtime. 10/12/16   Henson, Vickie L, NP-C  tiZANidine (ZANAFLEX) 4 MG tablet Take 4 mg by mouth 2 (two) times daily.    [provider]    Family History Family History  Problem Relation Age of Onset  . Hypertension Mother   .  Glaucoma Mother   . Cataracts Mother   . Diabetes Father   . Hypertension Father   . Glaucoma Brother   . Fibromyalgia Daughter   . Migraines Daughter   . Migraines Daughter   . Asthma Daughter   . Colon cancer Neg Hx   . Esophageal cancer Neg Hx   . Pancreatic cancer Neg Hx   . Rectal cancer Neg Hx   . Stomach cancer Neg Hx     Social History Social History  Substance Use Topics  . Smoking status: Never Smoker  . Smokeless tobacco: Never Used  . Alcohol use No     Allergies   Amoxicillin; Doxycycline; and Penicillins   Review of Systems Review of Systems ROS reviewed and all are negative for acute change except as noted in the HPI.  Physical Exam Updated Vital Signs BP 131/82 (BP Location: Right  Arm)   Pulse 82   Temp 98 F (36.7 C) (Oral)   Resp 18   SpO2 100%   Physical Exam  Constitutional: She is oriented to person, place, and time. Vital signs are normal. She appears well-developed and well-nourished.  HENT:  Head: Normocephalic.  Right Ear: Hearing normal.  Left Ear: Hearing normal.  Eyes: Pupils are equal, round, and reactive to light. Conjunctivae and EOM are normal.  Cardiovascular: Normal rate and regular rhythm.   Pulmonary/Chest: Effort normal.  Neurological: She is alert and oriented to person, place, and time. She has normal strength. No cranial nerve deficit or sensory deficit.  Cranial Nerves:  II: Pupils equal, round, reactive to light III,IV, VI: ptosis not present, extra-ocular motions intact bilaterally  V,VII: smile symmetric, facial light touch sensation equal VIII: hearing grossly normal bilaterally  IX,X: midline uvula rise  XI: bilateral shoulder shrug equal and strong XII: midline tongue extension  Skin: Skin is warm and dry.  Psychiatric: She has a normal mood and affect. Her speech is normal and behavior is normal. Thought content normal.  Nursing note and vitals reviewed.  ED Treatments / Results  Labs (all labs ordered are listed, but only abnormal results are displayed) Labs Reviewed - No data to display  EKG  EKG Interpretation None       Radiology Ct Head Wo Contrast  Result Date: 12/24/2016 CLINICAL DATA:  Headache history of fall EXAM: CT HEAD WITHOUT CONTRAST TECHNIQUE: Contiguous axial images were obtained from the base of the skull through the vertex without intravenous contrast. COMPARISON:  None. FINDINGS: Brain: No evidence of acute infarction, hemorrhage, hydrocephalus, extra-axial collection or mass lesion/mass effect. Vascular: No hyperdense vessel or unexpected calcification. Skull: Normal. Negative for fracture or focal lesion. Small metallic densities adjacent to the mandibular heads. Sinuses/Orbits: No acute finding.  Other: None IMPRESSION: No CT evidence for acute intracranial abnormality Electronically Signed   By: Donavan Foil M.D.   On: 12/24/2016 18:59    Procedures Procedures (including critical care time)  Medications Ordered in ED Medications - No data to display   Initial Impression / Assessment and Plan / ED Course  I have reviewed the triage vital signs and the nursing notes.  Pertinent labs & imaging results that were available during my care of the patient were reviewed by me and considered in my medical decision making (see chart for details).  Final Clinical Impressions(s) / ED Diagnoses     {I have reviewed the relevant previous healthcare records.  {I obtained HPI from historian.   ED Course:  Assessment: Pt is a 51 y.o.  female presents to the Emergency Department today due to head injury on Tuesday. Pt states that she tripped and fell. Notes headache Tuesday night and Wednesday morning. States headache worsening. States headache on right side where impact was located. No N/V. Rates pain 8/10. Intermittent. No N/V. No visual changes. Does endorse mild photophobia and phonophobia. Staff at work noted change in mentation with increase memory loss and drowsiness. No numbness/tingling. No unilateral weakness. No fevers. No neck stiffness. No slurring of speech. No meds PTA. On exam, pt in NAD. Nontoxic/nonseptic appearing. VSS. Afebrile. CN evaluated and unremarkable. Concern due to decrease in memory. Likely post concussive. CT Imaging unremarkable. Given analgesia in ED. Plan is to DC home with follow up to PCPD. At time of discharge, Patient is in no acute distress. Vital Signs are stable. Patient is able to ambulate. Patient able to tolerate PO.    Disposition/Plan:  DC Home Additional Verbal discharge instructions given and discussed with patient.  Pt Instructed to f/u with PCP in the next week for evaluation and treatment of symptoms. Return precautions given Pt acknowledges and  agrees with plan  Supervising Physician Fredia Sorrow, MD  Final diagnoses:  Post concussive syndrome    New Prescriptions New Prescriptions   No medications on file     Shary Decamp, Hershal Coria 12/24/16 1949    Fredia Sorrow, MD 12/24/16 2154

## 2016-12-24 NOTE — ED Notes (Signed)
Pt ambulatory and independent at discharge.  Verbalized understanding of discharge instructions 

## 2017-01-07 DIAGNOSIS — M7060 Trochanteric bursitis, unspecified hip: Secondary | ICD-10-CM | POA: Diagnosis not present

## 2017-01-07 DIAGNOSIS — Z79899 Other long term (current) drug therapy: Secondary | ICD-10-CM | POA: Diagnosis not present

## 2017-01-07 DIAGNOSIS — Z79891 Long term (current) use of opiate analgesic: Secondary | ICD-10-CM | POA: Diagnosis not present

## 2017-01-07 DIAGNOSIS — G5600 Carpal tunnel syndrome, unspecified upper limb: Secondary | ICD-10-CM | POA: Diagnosis not present

## 2017-01-07 DIAGNOSIS — G894 Chronic pain syndrome: Secondary | ICD-10-CM | POA: Diagnosis not present

## 2017-01-08 ENCOUNTER — Ambulatory Visit (INDEPENDENT_AMBULATORY_CARE_PROVIDER_SITE_OTHER): Payer: BLUE CROSS/BLUE SHIELD | Admitting: Family Medicine

## 2017-01-08 ENCOUNTER — Encounter: Payer: Self-pay | Admitting: Family Medicine

## 2017-01-08 VITALS — BP 130/80 | HR 89 | Wt 195.6 lb

## 2017-01-08 DIAGNOSIS — E559 Vitamin D deficiency, unspecified: Secondary | ICD-10-CM | POA: Diagnosis not present

## 2017-01-08 DIAGNOSIS — Z79899 Other long term (current) drug therapy: Secondary | ICD-10-CM | POA: Diagnosis not present

## 2017-01-08 DIAGNOSIS — E6609 Other obesity due to excess calories: Secondary | ICD-10-CM | POA: Diagnosis not present

## 2017-01-08 DIAGNOSIS — Z6834 Body mass index (BMI) 34.0-34.9, adult: Secondary | ICD-10-CM | POA: Diagnosis not present

## 2017-01-08 DIAGNOSIS — M797 Fibromyalgia: Secondary | ICD-10-CM

## 2017-01-08 DIAGNOSIS — E119 Type 2 diabetes mellitus without complications: Secondary | ICD-10-CM | POA: Diagnosis not present

## 2017-01-08 MED ORDER — DULOXETINE HCL 30 MG PO CPEP
30.0000 mg | ORAL_CAPSULE | Freq: Two times a day (BID) | ORAL | 0 refills | Status: DC
Start: 2017-01-08 — End: 2017-02-04

## 2017-01-08 NOTE — Progress Notes (Signed)
   Subjective:    Patient ID: [name], female    DOB: 12-Jun-1965, 51 y.o.   MRN: [id number]  HPI Chief Complaint  Patient presents with  . med check    med check, declines flu shot   She is here for a med check.   States she ran out of Cymbalta a month early and she has been taking them as prescribed. She does not know what happened. States her medication is not accessible for anyone to tamper with. Suspects that the pharmacy accidentally gave her 60 tabs instead of 90. States pain is returning and is unbearable. Requests 30 day refill.   States she is doing well with new diabetes medication Ozempic. No side effects. FBS 121-142. States this is better than prior to starting Ozempic. She did not want to take Metformin due to it being a pill. Prefers once weekly injection.   Taking simvastatin and doing well.   Vitamin D supplement 800 IU daily. Wants to know if she should increase her dose.   Denies fever, chills, dizziness, chest pain, palpitations, shortness of breath, abdominal pain, N/V/D, urinary symptoms.    Reviewed allergies, medications, past medical, surgical,  and social history.    Review of Systems Pertinent positives and negatives in the history of present illness.     Objective:   Physical Exam BP 130/80   Pulse 89   Wt 195 lb 9.6 oz (88.7 kg)   BMI 34.11 kg/m   Alert and oriented and in no acute distress.       Assessment & Plan:  Vitamin D deficiency - Plan: VITAMIN D 25 Hydroxy (Vit-D Deficiency, Fractures)  Class 1 obesity due to excess calories without serious comorbidity with body mass index (BMI) of 34.0 to 34.9 in adult  Fibromyalgia  New onset type 2 diabetes mellitus ([name])  Medication management - Plan: VITAMIN D 25 Hydroxy (Vit-D Deficiency, Fractures)  Plan to check labs and follow up regarding vitamin D.  Counseled on diet and exercise for weight loss.  Blood sugars are improving on Ozempic and no side effects.  Will refill  cymbalta for 30 days.  Follow up for CPE in January.

## 2017-01-09 ENCOUNTER — Encounter: Payer: Self-pay | Admitting: Family Medicine

## 2017-01-09 LAB — VITAMIN D 25 HYDROXY (VIT D DEFICIENCY, FRACTURES): Vit D, 25-Hydroxy: 25 ng/mL — ABNORMAL LOW (ref 30–100)

## 2017-01-11 MED ORDER — SEMAGLUTIDE(0.25 OR 0.5MG/DOS) 2 MG/1.5ML ~~LOC~~ SOPN: 0.5000 mg | PEN_INJECTOR | SUBCUTANEOUS | 0 refills | Status: DC

## 2017-01-14 ENCOUNTER — Ambulatory Visit: Payer: BLUE CROSS/BLUE SHIELD | Admitting: Family Medicine

## 2017-02-04 ENCOUNTER — Other Ambulatory Visit: Payer: Self-pay | Admitting: Family Medicine

## 2017-02-04 DIAGNOSIS — M79606 Pain in leg, unspecified: Secondary | ICD-10-CM | POA: Diagnosis not present

## 2017-02-04 DIAGNOSIS — M25559 Pain in unspecified hip: Secondary | ICD-10-CM | POA: Diagnosis not present

## 2017-02-04 DIAGNOSIS — M545 Low back pain: Secondary | ICD-10-CM | POA: Diagnosis not present

## 2017-02-04 DIAGNOSIS — G894 Chronic pain syndrome: Secondary | ICD-10-CM | POA: Diagnosis not present

## 2017-02-05 ENCOUNTER — Other Ambulatory Visit: Payer: Self-pay | Admitting: Family Medicine

## 2017-02-13 ENCOUNTER — Encounter: Payer: Self-pay | Admitting: Family Medicine

## 2017-02-15 MED ORDER — SEMAGLUTIDE(0.25 OR 0.5MG/DOS) 2 MG/1.5ML ~~LOC~~ SOPN: 0.5000 mg | PEN_INJECTOR | SUBCUTANEOUS | 3 refills | Status: DC

## 2017-03-04 DIAGNOSIS — Z79899 Other long term (current) drug therapy: Secondary | ICD-10-CM | POA: Diagnosis not present

## 2017-03-04 DIAGNOSIS — G894 Chronic pain syndrome: Secondary | ICD-10-CM | POA: Diagnosis not present

## 2017-03-04 DIAGNOSIS — M25559 Pain in unspecified hip: Secondary | ICD-10-CM | POA: Diagnosis not present

## 2017-03-04 DIAGNOSIS — M25539 Pain in unspecified wrist: Secondary | ICD-10-CM | POA: Diagnosis not present

## 2017-03-04 DIAGNOSIS — Z79891 Long term (current) use of opiate analgesic: Secondary | ICD-10-CM | POA: Diagnosis not present

## 2017-03-04 DIAGNOSIS — M545 Low back pain: Secondary | ICD-10-CM | POA: Diagnosis not present

## 2017-03-17 DIAGNOSIS — M2560 Stiffness of unspecified joint, not elsewhere classified: Secondary | ICD-10-CM | POA: Diagnosis not present

## 2017-03-17 DIAGNOSIS — M545 Low back pain: Secondary | ICD-10-CM | POA: Diagnosis not present

## 2017-03-17 DIAGNOSIS — M6281 Muscle weakness (generalized): Secondary | ICD-10-CM | POA: Diagnosis not present

## 2017-03-17 DIAGNOSIS — M5416 Radiculopathy, lumbar region: Secondary | ICD-10-CM | POA: Diagnosis not present

## 2017-03-24 ENCOUNTER — Other Ambulatory Visit: Payer: Self-pay | Admitting: Family Medicine

## 2017-03-24 DIAGNOSIS — M5416 Radiculopathy, lumbar region: Secondary | ICD-10-CM | POA: Diagnosis not present

## 2017-03-24 DIAGNOSIS — M2560 Stiffness of unspecified joint, not elsewhere classified: Secondary | ICD-10-CM | POA: Diagnosis not present

## 2017-03-24 DIAGNOSIS — M545 Low back pain: Secondary | ICD-10-CM | POA: Diagnosis not present

## 2017-03-24 DIAGNOSIS — M6281 Muscle weakness (generalized): Secondary | ICD-10-CM | POA: Diagnosis not present

## 2017-04-01 ENCOUNTER — Other Ambulatory Visit: Payer: Self-pay | Admitting: Family Medicine

## 2017-04-01 DIAGNOSIS — M545 Low back pain: Secondary | ICD-10-CM | POA: Diagnosis not present

## 2017-04-01 DIAGNOSIS — M25559 Pain in unspecified hip: Secondary | ICD-10-CM | POA: Diagnosis not present

## 2017-04-01 DIAGNOSIS — Z79891 Long term (current) use of opiate analgesic: Secondary | ICD-10-CM | POA: Diagnosis not present

## 2017-04-01 DIAGNOSIS — G894 Chronic pain syndrome: Secondary | ICD-10-CM | POA: Diagnosis not present

## 2017-04-01 DIAGNOSIS — Z79899 Other long term (current) drug therapy: Secondary | ICD-10-CM | POA: Diagnosis not present

## 2017-04-01 DIAGNOSIS — M25539 Pain in unspecified wrist: Secondary | ICD-10-CM | POA: Diagnosis not present

## 2017-04-02 DIAGNOSIS — M6281 Muscle weakness (generalized): Secondary | ICD-10-CM | POA: Diagnosis not present

## 2017-04-02 DIAGNOSIS — M5416 Radiculopathy, lumbar region: Secondary | ICD-10-CM | POA: Diagnosis not present

## 2017-04-02 DIAGNOSIS — M545 Low back pain: Secondary | ICD-10-CM | POA: Diagnosis not present

## 2017-04-02 DIAGNOSIS — M2560 Stiffness of unspecified joint, not elsewhere classified: Secondary | ICD-10-CM | POA: Diagnosis not present

## 2017-04-07 ENCOUNTER — Encounter: Payer: Self-pay | Admitting: Family Medicine

## 2017-04-29 DIAGNOSIS — M25539 Pain in unspecified wrist: Secondary | ICD-10-CM | POA: Diagnosis not present

## 2017-04-29 DIAGNOSIS — M79606 Pain in leg, unspecified: Secondary | ICD-10-CM | POA: Diagnosis not present

## 2017-04-29 DIAGNOSIS — G894 Chronic pain syndrome: Secondary | ICD-10-CM | POA: Diagnosis not present

## 2017-04-29 DIAGNOSIS — M25559 Pain in unspecified hip: Secondary | ICD-10-CM | POA: Diagnosis not present

## 2017-05-07 ENCOUNTER — Other Ambulatory Visit: Payer: Self-pay | Admitting: Family Medicine

## 2017-05-07 NOTE — Telephone Encounter (Signed)
Pt has an upcoming appointment with Vickie 05-21-17. Last med check was 01-14-17. Please advise. Thanks [employer]

## 2017-05-18 ENCOUNTER — Encounter: Payer: BLUE CROSS/BLUE SHIELD | Admitting: Family Medicine

## 2017-05-20 DIAGNOSIS — E119 Type 2 diabetes mellitus without complications: Secondary | ICD-10-CM | POA: Insufficient documentation

## 2017-05-20 NOTE — Progress Notes (Signed)
Subjective:    Patient ID: [name], female    DOB: Jan 15, 1966, 52 y.o.   MRN: [id number]  HPI Chief Complaint  Patient presents with  . cpe    fasting cpe,sees obgyn. eye exam done 6 months ago   She is here for a complete physical exam and to follow up on other chronic health conditions. History of diabetes well controlled.   Other providers: Dr. Raphael Gibney- OB/GYN, Dr. Loletha Carrow is GI, Dr. Marcell Anger is pain management specialist.  Going to PT for pain.  Takes Linzess for diarrhea and this is working.  Mood is "pretty good", feeling better than during the holidays. More good days than bad.   Taking Ozempic and doing well on this. She did not want to start with Metformin.  She has lost 41 lbs since May 2017 and feeling great about this.   Social history: Lives with Walker her husband , works at the [employer]: fairly healthy  Excerise: 3 days a week.   Immunizations: Tdap up to date. Declines flu shot. Declines pneumonia injection.   Health maintenance:  Mammogram: she is due to schedule this.  Colonoscopy: March 2018 Last Gynecological Exam: up to date with OB/GYN  Last Dental Exam: appointment next week with [facility]  Last Eye Exam: 6 months ago. No retinopathy   Wears seatbelt always, uses sunscreen, smoke detectors in home and functioning, does not text while driving and feels safe in home environment.   Reviewed allergies, medications, past medical, surgical, family, and social history.     Review of Systems Review of Systems Constitutional: -fever, -chills, -sweats, -unexpected weight change,-fatigue ENT: -runny nose, -ear pain, -sore throat Cardiology:  -chest pain, -palpitations, -edema Respiratory: -cough, -shortness of breath, -wheezing Gastroenterology: -abdominal pain, -nausea, -vomiting, -diarrhea, -constipation  Hematology: -bleeding or bruising problems Musculoskeletal: -arthralgias, -myalgias, -joint swelling, -back pain Ophthalmology: -vision  changes Urology: -dysuria, -difficulty urinating, -hematuria, -urinary frequency, -urgency Neurology: -headache, -weakness, -tingling, -numbness       Objective:   Physical Exam BP 120/80   Pulse 88   Ht 5\' 8"  (1.727 m)   Wt 188 lb 3.2 oz (85.4 kg)   BMI 28.62 kg/m   General Appearance:    Alert, cooperative, no distress, appears stated age  Head:    Normocephalic, without obvious abnormality, atraumatic  Eyes:    PERRL, conjunctiva/corneas clear, EOM's intact, fundi    benign  Ears:    Normal TM's and external ear canals  Nose:   Nares normal, mucosa normal, no drainage or sinus   tenderness  Throat:   Lips, mucosa, and tongue normal; teeth and gums normal  Neck:   Supple, no lymphadenopathy;  thyroid:  no   enlargement/tenderness/nodules; no carotid   bruit or JVD  Back:    Spine nontender, no curvature, ROM normal, no CVA     tenderness  Lungs:     Clear to auscultation bilaterally without wheezes, rales or     ronchi; respirations unlabored  Chest Wall:    No tenderness or deformity   Heart:    Regular rate and rhythm, S1 and S2 normal, no murmur, rub   or gallop  Breast Exam:    Done at OB/GYN  Abdomen:     Soft, non-tender, nondistended, normoactive bowel sounds,    no masses, no hepatosplenomegaly  Genitalia:    Done at OB/GYN     Extremities:   No clubbing, cyanosis or edema  Pulses:   2+ and symmetric all extremities  Skin:   Skin color, texture, turgor normal, no rashes or lesions  Lymph nodes:   Cervical, supraclavicular, and axillary nodes normal  Neurologic:   CNII-XII intact, normal strength, sensation and gait; reflexes 2+ and symmetric throughout          Psych:   Normal mood, affect, hygiene and grooming.    Urinalysis dipstick: negative       Assessment & Plan:  Routine general medical examination at a health care facility - Plan: CBC with Differential/Platelet, Comprehensive metabolic panel, TSH, Lipid panel, POCT Urinalysis DIP (Proadvantage  Device)  Elevated LDL cholesterol level - Plan: Lipid panel  Vitamin D deficiency  Type 2 diabetes mellitus without complication, without long-term current use of insulin (HCC) - Plan: HgB A1c, Comprehensive metabolic panel, TSH, Lipid panel  Medication management - Plan: Lipid panel  History of thyroid disorder - Plan: TSH  Hgb A1c 6.0% She is doing well on the Ozempic. Good medication compliance.  She has lost significant weight and her goal weight is now 170 pounds.  She feels very good about this. She is taking statin daily for elevated LDL and diabetes.  We will check fasting lipids today. Reports history of thyroid disease years ago but never took medication for this.  We will check her TSH today. Mood appears to be stable she did have a rough time over the holidays but now she is doing fine. We will follow-up pending labs or in 4 months for diabetes and at that time we may be able to stop the Ozempic if she continues doing well with healthy diet and exercise and weight loss.

## 2017-05-21 ENCOUNTER — Encounter: Payer: Self-pay | Admitting: Family Medicine

## 2017-05-21 ENCOUNTER — Ambulatory Visit (INDEPENDENT_AMBULATORY_CARE_PROVIDER_SITE_OTHER): Payer: BLUE CROSS/BLUE SHIELD | Admitting: Family Medicine

## 2017-05-21 VITALS — BP 120/80 | HR 88 | Ht 68.0 in | Wt 188.2 lb

## 2017-05-21 DIAGNOSIS — E119 Type 2 diabetes mellitus without complications: Secondary | ICD-10-CM | POA: Diagnosis not present

## 2017-05-21 DIAGNOSIS — Z0000 Encounter for general adult medical examination without abnormal findings: Secondary | ICD-10-CM

## 2017-05-21 DIAGNOSIS — E559 Vitamin D deficiency, unspecified: Secondary | ICD-10-CM | POA: Diagnosis not present

## 2017-05-21 DIAGNOSIS — Z79899 Other long term (current) drug therapy: Secondary | ICD-10-CM | POA: Diagnosis not present

## 2017-05-21 DIAGNOSIS — E7800 Pure hypercholesterolemia, unspecified: Secondary | ICD-10-CM | POA: Diagnosis not present

## 2017-05-21 DIAGNOSIS — Z8639 Personal history of other endocrine, nutritional and metabolic disease: Secondary | ICD-10-CM

## 2017-05-21 LAB — POCT GLYCOSYLATED HEMOGLOBIN (HGB A1C): Hemoglobin A1C: 6

## 2017-05-21 LAB — POCT URINALYSIS DIP (PROADVANTAGE DEVICE)
Bilirubin, UA: NEGATIVE
Blood, UA: NEGATIVE
Glucose, UA: NEGATIVE mg/dL
Ketones, POC UA: NEGATIVE mg/dL
Leukocytes, UA: NEGATIVE
Nitrite, UA: NEGATIVE
Protein Ur, POC: NEGATIVE mg/dL
Specific Gravity, Urine: 1.03
Urobilinogen, Ur: NEGATIVE
pH, UA: 6 (ref 5.0–8.0)

## 2017-05-21 NOTE — Patient Instructions (Signed)
Your hemoglobin A1c today is 6.0% and has improved significantly.  We may be able to stop the Ozempic at your next appointment if this continues to improve.  Continue with healthy diet and exercise.  Congratulations on your recent weight loss. You are no longer in the obese category for your BMI range.  We will call you with your lab results.  I will see you back in 4 months for diabetes check.   Preventative Care for Adults - Female      MAINTAIN REGULAR HEALTH EXAMS:  A routine yearly physical is a good way to check in with your primary care provider about your health and preventive screening. It is also an opportunity to share updates about your health and any concerns you have, and receive a thorough all-over exam.   Most health insurance companies pay for at least some preventative services.  Check with your health plan for specific coverages.  WHAT PREVENTATIVE SERVICES DO WOMEN NEED?  Adult women should have their weight and blood pressure checked regularly.   Women age 48 and older should have their cholesterol levels checked regularly.  Women should be screened for cervical cancer with a Pap smear and pelvic exam beginning at either age 28, or 3 years after they become sexually activity.    Breast cancer screening generally begins at age 14 with a mammogram and breast exam by your primary care provider.    Beginning at age 56 and continuing to age 78, women should be screened for colorectal cancer.  Certain people may need continued testing until age 46.  Updating vaccinations is part of preventative care.  Vaccinations help protect against diseases such as the flu.  Osteoporosis is a disease in which the bones lose minerals and strength as we age. Women ages 30 and over should discuss this with their caregivers, as should women after menopause who have other risk factors.  Lab tests are generally done as part of preventative care to screen for anemia and blood disorders, to  screen for problems with the kidneys and liver, to screen for bladder problems, to check blood sugar, and to check your cholesterol level.  Preventative services generally include counseling about diet, exercise, avoiding tobacco, drugs, excessive alcohol consumption, and sexually transmitted infections.    GENERAL RECOMMENDATIONS FOR GOOD HEALTH:  Healthy diet:  Eat a variety of foods, including fruit, vegetables, animal or vegetable protein, such as meat, fish, chicken, and eggs, or beans, lentils, tofu, and grains, such as rice.  Drink plenty of water daily.  Decrease saturated fat in the diet, avoid lots of red meat, processed foods, sweets, fast foods, and fried foods.  Exercise:  Aerobic exercise helps maintain good heart health. At least 30-40 minutes of moderate-intensity exercise is recommended. For example, a brisk walk that increases your heart rate and breathing. This should be done on most days of the week.   Find a type of exercise or a variety of exercises that you enjoy so that it becomes a part of your daily life.  Examples are running, walking, swimming, water aerobics, and biking.  For motivation and support, explore group exercise such as aerobic class, spin class, Zumba, Yoga,or  martial arts, etc.    Set exercise goals for yourself, such as a certain weight goal, walk or run in a race such as a 5k walk/run.  Speak to your primary care provider about exercise goals.  Disease prevention:  If you smoke or chew tobacco, find out from your caregiver  how to quit. It can literally save your life, no matter how long you have been a tobacco user. If you do not use tobacco, never begin.   Maintain a healthy diet and normal weight. Increased weight leads to problems with blood pressure and diabetes.   The Body Mass Index or BMI is a way of measuring how much of your body is fat. Having a BMI above 27 increases the risk of heart disease, diabetes, hypertension, stroke and other  problems related to obesity. Your caregiver can help determine your BMI and based on it develop an exercise and dietary program to help you achieve or maintain this important measurement at a healthful level.  High blood pressure causes heart and blood vessel problems.  Persistent high blood pressure should be treated with medicine if weight loss and exercise do not work.   Fat and cholesterol leaves deposits in your arteries that can block them. This causes heart disease and vessel disease elsewhere in your body.  If your cholesterol is found to be high, or if you have heart disease or certain other medical conditions, then you may need to have your cholesterol monitored frequently and be treated with medication.   Ask if you should have a cardiac stress test if your history suggests this. A stress test is a test done on a treadmill that looks for heart disease. This test can find disease prior to there being a problem.  Menopause can be associated with physical symptoms and risks. Hormone replacement therapy is available to decrease these. You should talk to your caregiver about whether starting or continuing to take hormones is right for you.   Osteoporosis is a disease in which the bones lose minerals and strength as we age. This can result in serious bone fractures. Risk of osteoporosis can be identified using a bone density scan. Women ages 21 and over should discuss this with their caregivers, as should women after menopause who have other risk factors. Ask your caregiver whether you should be taking a calcium supplement and Vitamin D, to reduce the rate of osteoporosis.   Avoid drinking alcohol in excess (more than two drinks per day).  Avoid use of street drugs. Do not share needles with anyone. Ask for professional help if you need assistance or instructions on stopping the use of alcohol, cigarettes, and/or drugs.  Brush your teeth twice a day with fluoride toothpaste, and floss once a day.  Good oral hygiene prevents tooth decay and gum disease. The problems can be painful, unattractive, and can cause other health problems. Visit your dentist for a routine oral and dental check up and preventive care every 6-12 months.   Look at your skin regularly.  Use a mirror to look at your back. Notify your caregivers of changes in moles, especially if there are changes in shapes, colors, a size larger than a pencil eraser, an irregular border, or development of new moles.  Safety:  Use seatbelts 100% of the time, whether driving or as a passenger.  Use safety devices such as hearing protection if you work in environments with loud noise or significant background noise.  Use safety glasses when doing any work that could send debris in to the eyes.  Use a helmet if you ride a bike or motorcycle.  Use appropriate safety gear for contact sports.  Talk to your caregiver about gun safety.  Use sunscreen with a SPF (or skin protection factor) of 15 or greater.  Lighter skinned people are  at a greater risk of skin cancer. Don't forget to also wear sunglasses in order to protect your eyes from too much damaging sunlight. Damaging sunlight can accelerate cataract formation.   Practice safe sex. Use condoms. Condoms are used for birth control and to help reduce the spread of sexually transmitted infections (or STIs).  Some of the STIs are gonorrhea (the clap), chlamydia, syphilis, trichomonas, herpes, HPV (human papilloma virus) and HIV (human immunodeficiency virus) which causes AIDS. The herpes, HIV and HPV are viral illnesses that have no cure. These can result in disability, cancer and death.   Keep carbon monoxide and smoke detectors in your home functioning at all times. Change the batteries every 6 months or use a model that plugs into the wall.   Vaccinations:  Stay up to date with your tetanus shots and other required immunizations. You should have a booster for tetanus every 10 years. Be sure to  get your flu shot every year, since 5%-20% of the [place] population comes down with the flu. The flu vaccine changes each year, so being vaccinated once is not enough. Get your shot in the fall, before the flu season peaks.   Other vaccines to consider:  Human Papilloma Virus or HPV causes cancer of the cervix, and other infections that can be transmitted from person to person. There is a vaccine for HPV, and females should get immunized between the ages of 43 and 27. It requires a series of 3 shots.   Pneumococcal vaccine to protect against certain types of pneumonia.  This is normally recommended for adults age 22 or older.  However, adults younger than 54 years old with certain underlying conditions such as diabetes, heart or lung disease should also receive the vaccine.  Shingles vaccine to protect against Varicella Zoster if you are older than age 61, or younger than 54 years old with certain underlying illness.  Hepatitis A vaccine to protect against a form of infection of the liver by a virus acquired from food.  Hepatitis B vaccine to protect against a form of infection of the liver by a virus acquired from blood or body fluids, particularly if you work in health care.  If you plan to travel internationally, check with your local health department for specific vaccination recommendations.  Cancer Screening:  Breast cancer screening is essential to preventive care for women. All women age 21 and older should perform a breast self-exam every month. At age 30 and older, women should have their caregiver complete a breast exam each year. Women at ages 28 and older should have a mammogram (x-ray film) of the breasts. Your caregiver can discuss how often you need mammograms.    Cervical cancer screening includes taking a Pap smear (sample of cells examined under a microscope) from the cervix (end of the uterus). It also includes testing for HPV (Human Papilloma Virus, which can cause cervical  cancer). Screening and a pelvic exam should begin at age 8, or 3 years after a woman becomes sexually active. Screening should occur every year, with a Pap smear but no HPV testing, up to age 30. After age 72, you should have a Pap smear every 3 years with HPV testing, if no HPV was found previously.   Most routine colon cancer screening begins at the age of 37. On a yearly basis, doctors may provide special easy to use take-home tests to check for hidden blood in the stool. Sigmoidoscopy or colonoscopy can detect the earliest forms of colon  cancer and is life saving. These tests use a small camera at the end of a tube to directly examine the colon. Speak to your caregiver about this at age 64, when routine screening begins (and is repeated every 5 years unless early forms of pre-cancerous polyps or small growths are found).

## 2017-05-22 LAB — LIPID PANEL
Chol/HDL Ratio: 2.8 ratio (ref 0.0–4.4)
Cholesterol, Total: 162 mg/dL (ref 100–199)
HDL: 58 mg/dL (ref >39)
LDL Calculated: 89 mg/dL (ref 0–99)
Triglycerides: 74 mg/dL (ref 0–149)
VLDL Cholesterol Cal: 15 mg/dL (ref 5–40)

## 2017-05-22 LAB — COMPREHENSIVE METABOLIC PANEL
ALT: 12 IU/L (ref 0–32)
AST: 15 IU/L (ref 0–40)
Albumin/Globulin Ratio: 1.6 (ref 1.2–2.2)
Albumin: 4.5 g/dL (ref 3.5–5.5)
Alkaline Phosphatase: 120 IU/L — ABNORMAL HIGH (ref 39–117)
BUN/Creatinine Ratio: 10 (ref 9–23)
BUN: 10 mg/dL (ref 6–24)
Bilirubin Total: 0.4 mg/dL (ref 0.0–1.2)
CO2: 26 mmol/L (ref 20–29)
Calcium: 9.9 mg/dL (ref 8.7–10.2)
Chloride: 103 mmol/L (ref 96–106)
Creatinine, Ser: 0.96 mg/dL (ref 0.57–1.00)
GFR calc Af Amer: 78 mL/min/{1.73_m2} (ref >59)
GFR calc non Af Amer: 68 mL/min/{1.73_m2} (ref >59)
Globulin, Total: 2.8 g/dL (ref 1.5–4.5)
Glucose: 103 mg/dL — ABNORMAL HIGH (ref 65–99)
Potassium: 3.8 mmol/L (ref 3.5–5.2)
Sodium: 143 mmol/L (ref 134–144)
Total Protein: 7.3 g/dL (ref 6.0–8.5)

## 2017-05-22 LAB — CBC WITH DIFFERENTIAL/PLATELET
Basophils Absolute: 0 10*3/uL (ref 0.0–0.2)
Basos: 0 %
EOS (ABSOLUTE): 0.1 10*3/uL (ref 0.0–0.4)
Eos: 1 %
Hematocrit: 39.5 % (ref 34.0–46.6)
Hemoglobin: 12.9 g/dL (ref 11.1–15.9)
Immature Grans (Abs): 0 10*3/uL (ref 0.0–0.1)
Immature Granulocytes: 0 %
Lymphocytes Absolute: 2.4 10*3/uL (ref 0.7–3.1)
Lymphs: 35 %
MCH: 27.6 pg (ref 26.6–33.0)
MCHC: 32.7 g/dL (ref 31.5–35.7)
MCV: 85 fL (ref 79–97)
Monocytes Absolute: 0.4 10*3/uL (ref 0.1–0.9)
Monocytes: 5 %
Neutrophils Absolute: 3.9 10*3/uL (ref 1.4–7.0)
Neutrophils: 59 %
Platelets: 260 10*3/uL (ref 150–379)
RBC: 4.67 x10E6/uL (ref 3.77–5.28)
RDW: 14.6 % (ref 12.3–15.4)
WBC: 6.7 10*3/uL (ref 3.4–10.8)

## 2017-05-22 LAB — TSH: TSH: 1.46 u[IU]/mL (ref 0.450–4.500)

## 2017-05-27 DIAGNOSIS — Z79891 Long term (current) use of opiate analgesic: Secondary | ICD-10-CM | POA: Diagnosis not present

## 2017-05-27 DIAGNOSIS — M79606 Pain in leg, unspecified: Secondary | ICD-10-CM | POA: Diagnosis not present

## 2017-05-27 DIAGNOSIS — M25559 Pain in unspecified hip: Secondary | ICD-10-CM | POA: Diagnosis not present

## 2017-05-27 DIAGNOSIS — G894 Chronic pain syndrome: Secondary | ICD-10-CM | POA: Diagnosis not present

## 2017-05-27 DIAGNOSIS — Z79899 Other long term (current) drug therapy: Secondary | ICD-10-CM | POA: Diagnosis not present

## 2017-05-27 DIAGNOSIS — M545 Low back pain: Secondary | ICD-10-CM | POA: Diagnosis not present

## 2017-06-10 ENCOUNTER — Other Ambulatory Visit: Payer: Self-pay | Admitting: Family Medicine

## 2017-06-10 DIAGNOSIS — M7060 Trochanteric bursitis, unspecified hip: Secondary | ICD-10-CM | POA: Diagnosis not present

## 2017-06-24 DIAGNOSIS — M25559 Pain in unspecified hip: Secondary | ICD-10-CM | POA: Diagnosis not present

## 2017-06-24 DIAGNOSIS — M7060 Trochanteric bursitis, unspecified hip: Secondary | ICD-10-CM | POA: Diagnosis not present

## 2017-06-24 DIAGNOSIS — G894 Chronic pain syndrome: Secondary | ICD-10-CM | POA: Diagnosis not present

## 2017-06-24 DIAGNOSIS — M545 Low back pain: Secondary | ICD-10-CM | POA: Diagnosis not present

## 2017-07-22 DIAGNOSIS — G894 Chronic pain syndrome: Secondary | ICD-10-CM | POA: Diagnosis not present

## 2017-07-22 DIAGNOSIS — Z79891 Long term (current) use of opiate analgesic: Secondary | ICD-10-CM | POA: Diagnosis not present

## 2017-07-22 DIAGNOSIS — Z79899 Other long term (current) drug therapy: Secondary | ICD-10-CM | POA: Diagnosis not present

## 2017-07-22 DIAGNOSIS — M7060 Trochanteric bursitis, unspecified hip: Secondary | ICD-10-CM | POA: Diagnosis not present

## 2017-07-30 ENCOUNTER — Other Ambulatory Visit: Payer: Self-pay | Admitting: Family Medicine

## 2017-07-30 NOTE — Telephone Encounter (Signed)
[facility] on [address] is requesting to fill pt cymbalta. Please advise if this is ok . [place]

## 2017-07-30 NOTE — Telephone Encounter (Signed)
ok 

## 2017-07-30 NOTE — Telephone Encounter (Signed)
yours

## 2017-08-03 ENCOUNTER — Other Ambulatory Visit: Payer: Self-pay | Admitting: Family Medicine

## 2017-08-10 NOTE — Progress Notes (Signed)
Office Visit Note  Patient: [name]             Date of Birth: 1965-09-10           MRN: [id number]             PCP: Girtha Rm, NP-C Referring: Roche, Christian Mate, PA-C Visit Date: 08/24/2017 Occupation: Cytogeneticist    Subjective:  Pain in multiple joints   History of Present Illness: [name] is a 52 y.o. female with history of polyarthralgia and fibromyalgia syndrome seen in consultation per request of her PCP.  According to patient she has had fibromyalgia since 1996 for which she has been treated with multiple medications by Dr. Charlestine Night.  Currently she is on Cymbalta and muscle relaxers and taking over-the-counter anti-inflammatories and some natural anti-inflammatories.  She was on hydrocodone but had to come off the medication.  She states she had 2 withdrawals and she decided to discontinue hydrocodone.  She states for the last 6 months she has been having popping sensation in the joints of her hands.  She also has noticed pain and swelling in her right third PIP joint.  She states she has had problems with pain in her ankles in the past.  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  None of the other joints are painful.  She has been diagnosed with carpal tunnel syndrome but did not have surgery so far.  She also suffers from chronic lower back pain due to underlying disc disease of lumbar spine.  Activities of Daily Living:  Patient reports morning stiffness for 30 minutes.   Patient Reports nocturnal pain.  Difficulty dressing/grooming: Denies Difficulty climbing stairs: Reports Difficulty getting out of chair: Denies Difficulty using hands for taps, buttons, cutlery, and/or writing: Denies   Review of Systems  Constitutional: Positive for fatigue. Negative for night sweats, weight gain and weight loss.  HENT: Positive for mouth dryness. Negative for  mouth sores, trouble swallowing, trouble swallowing and nose dryness.   Eyes: Negative for pain, redness, visual disturbance and dryness.  Respiratory: Negative for cough, shortness of breath and difficulty breathing.   Cardiovascular: Negative for chest pain, palpitations, hypertension, irregular heartbeat and swelling in legs/feet.  Gastrointestinal: Negative for blood in stool, constipation and diarrhea.  Endocrine: Negative for increased urination.  Genitourinary: Negative for vaginal dryness.  Musculoskeletal: Positive for arthralgias, joint pain, joint swelling, myalgias, morning stiffness and myalgias. Negative for muscle weakness and muscle tenderness.  Skin: Negative for color change, rash, hair loss, skin tightness, ulcers and sensitivity to sunlight.  Allergic/Immunologic: Negative for susceptible to infections.  Neurological: Positive for headaches. Negative for dizziness, memory loss, night sweats and weakness.  Hematological: Negative for swollen glands.  Psychiatric/Behavioral: Positive for depressed mood and sleep disturbance. The patient is nervous/anxious.     PMFS History:  Patient Active Problem List   Diagnosis Date Noted  . History of migraine 08/24/2017  . Diabetes ([place])   . Vitamin D deficiency 01/08/2017  . Elevated LDL cholesterol level 08/10/2016  . Adenomatous polyps 07/13/2016  . Leg cramp 06/25/2015  . Arthritis, senescent 06/25/2015  . Hip bursitis 06/25/2015  . 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 Chronic pain syndrome 06/25/2015  . Muscle pain, fibromyalgia 06/25/2015  . Fibromyalgia 06/17/2015    Past Medical History:  Diagnosis Date  . Arthritis   . Bursitis   . Carpal tunnel syndrome   .  Chronic headaches   . Constipation   . Depression   . Diabetes ([place])    diet controlled, no meds  . Fibromyalgia   . Insomnia   . Snoring   . Tendonitis   . TMJ (dislocation of temporomandibular joint)     Family History  Problem Relation Age of Onset  .  Hypertension Mother   . Glaucoma Mother   . Cataracts Mother   . Diabetes Father   . Hypertension Father   . Glaucoma Brother   . Ataxia Brother   . Fibromyalgia Daughter   . Migraines Daughter   . Migraines Daughter   . Asthma Daughter   . Colon cancer Neg Hx   . Esophageal cancer Neg Hx   . Pancreatic cancer Neg Hx   . Rectal cancer Neg Hx   . Stomach cancer Neg Hx    Past Surgical History:  Procedure Laterality Date  . ABDOMINAL HYSTERECTOMY    . CESAREAN SECTION     x3  . cyst from hand    . DENTAL SURGERY    . KNEE SURGERY     right  . TEMPOROMANDIBULAR JOINT SURGERY    . TONSILLECTOMY     Social History   Social History Narrative  . Not on file     Objective: Vital Signs: BP 121/83 (BP Location: Left Arm, Patient Position: Sitting, Cuff Size: Normal)   Pulse 92   Resp 16   Ht 5' 3.5" (1.613 m)   Wt 188 lb 8 oz (85.5 kg)   BMI 32.87 kg/m    Physical Exam  Constitutional: She is oriented to person, place, and time. She appears well-developed and well-nourished.  HENT:  Head: Normocephalic and atraumatic.  Eyes: Conjunctivae and EOM are normal.  Neck: Normal range of motion.  Cardiovascular: Normal rate, regular rhythm, normal heart sounds and intact distal pulses.  Pulmonary/Chest: Effort normal and breath sounds normal.  Abdominal: Soft. Bowel sounds are normal.  Lymphadenopathy:    She has no cervical adenopathy.  Neurological: She is alert and oriented to person, place, and time.  Skin: Skin is warm and dry. Capillary refill takes less than 2 seconds.  Psychiatric: She has a normal mood and affect. Her behavior is normal.  Nursing note and vitals reviewed.    Musculoskeletal Exam: C-spine and thoracic spine were in good range of motion.  Lumbar spine and limited range of motion.  Shoulder joints elbow joints wrist joints with good range of motion.  Wrist joints MCPs PIPs DIPs were in good range of motion.  She has tenderness on palpation over the  right third PIP joint.  No warmth swelling or effusion was noted.  She had discomfort with range of motion of her right hip joint.  Knee joints ankles MTPs PIPs were in good range of motion.  She has some generalized hyperalgesia and positive tender points.  CDAI Exam: CDAI Homunculus Exam:   Joint Counts:  CDAI Tender Joint count: 0 CDAI Swollen Joint count: 0     Investigation: Findings:  November 2016 x-ray of the right hip joint was unremarkable I read the report on patient's cell phone.  CBC Latest Ref Rng & Units 05/21/2017 05/05/2016 06/11/2015  WBC 3.4 - 10.8 x10E3/uL 6.7 7.1 6.9  Hemoglobin 11.1 - 15.9 g/dL 12.9 12.8 13.1  Hematocrit 34.0 - 46.6 % 39.5 38.8 40.0  Platelets 150 - 379 x10E3/uL 260 288 237   CMP Latest Ref Rng & Units 05/21/2017 05/05/2016 06/11/2015  Glucose 65 -  99 mg/dL 103(H) 122(H) 104(H)  BUN 6 - 24 mg/dL 10 14 16   Creatinine 0.57 - 1.00 mg/dL 0.96 0.92 0.81  Sodium 134 - 144 mmol/L 143 141 140  Potassium 3.5 - 5.2 mmol/L 3.8 4.0 4.0  Chloride 96 - 106 mmol/L 103 102 105  CO2 20 - 29 mmol/L 26 29 25   Calcium 8.7 - 10.2 mg/dL 9.9 9.8 9.3  Total Protein 6.0 - 8.5 g/dL 7.3 7.4 7.2  Total Bilirubin 0.0 - 1.2 mg/dL 0.4 0.4 0.4  Alkaline Phos 39 - 117 IU/L 120(H) 108 103  AST 0 - 40 IU/L 15 14 20   ALT 0 - 32 IU/L 12 12 27     Imaging: Xr Hip Unilat W Or W/o Pelvis 2-3 Views Right  Result Date: 08/24/2017 No SI joint narrowing was noted.  No hip joint narrowing was noted.  Some spurring and calcification noted at the superior aspect of acetabulum.  Xr Hand 2 View Left  Result Date: 08/24/2017 No MCP narrowing was noted.  All PIP and DIP joint space narrowing was noted.  No intercarpal radiocarpal joint space narrowing was noted.  CMC narrowing was noted.  No erosive changes were noted. Impression: These findings are consistent with osteoarthritis of the hand.  Xr Hand 2 View Right  Result Date: 08/24/2017 No MCP narrowing was noted.  All PIP and DIP joint  space narrowing was noted.  No intercarpal radiocarpal joint space narrowing was noted.  CMC narrowing was noted.  No erosive changes were noted. Impression: These findings are consistent with osteoarthritis of the hand.   Speciality Comments: No specialty comments available.    Procedures:  No procedures performed Allergies: Amoxicillin; Doxycycline; and Penicillins   Assessment / Plan:     Visit Diagnoses: Polyarthralgia  Pain in both hands -she has pain in bilateral hands more so in the right hand.  She describes intermittent swelling in her right third PIP joint.  Plan: XR Hand 2 View Right, XR Hand 2 View Left x-rays were consistent with osteoarthritis.  She gives history of intermittent swelling.  I will schedule ultrasound of her hands.  I will also obtain following labs.  A list of natural anti-inflammatories was given.  I have given her a handout on hand exercises.  Pain of right hip joint -she has limited painful range of motion of her hip joint.  She brought me a report of her x-ray of her right hip joint from 2016 which was normal.  She states she had ultrasound-guided right hip joint injection by her pain management doctor for ongoing pain and discomfort.  Plan: XR HIP UNILAT W OR W/O PELVIS 2-3 VIEWS RIGHT and the x-rays were unremarkable except for spurring at the superior part of acetabulum.  Chronic pain of both ankles-she gives history of chronic ankle joint discomfort which was diagnosed as tendinitis in the past.  Fibromyalgia -she has long-standing history of fibromyalgia which was diagnosed by Dr. Charlestine Night.  She has been on on Zanaflx 4 mg BID, Cymbalta 30 mg BID, Baclofen 10 mg BID PRN.  Patient reports that she had been on hydrocodone in the past.  She recently decided to come off the hydrocodone and trying some over-the-counter natural treatment.  Chronic pain syndrome -  goes to pain management   Primary insomnia-good sleep hygiene was discussed.  Other fatigue-she  relates fatigue to insomnia.  Bilateral carpal tunnel syndrome-patient states that she never had surgery for carpal tunnel syndrome.  DDD (degenerative disc disease), lumbosacral-she has limited  range of motion and chronic discomfort.  Adenomatous polyps  History of diabetes mellitus, type II  Vitamin D deficiency-she is on vitamin D supplement.  History of depression-she is on Cymbalta.  History of migraine -she takes Excedrin Migraine on as needed basis.   Orders: Orders Placed This Encounter  Procedures  . XR HIP UNILAT W OR W/O PELVIS 2-3 VIEWS RIGHT  . XR Hand 2 View Right  . XR Hand 2 View Left  . Rheumatoid factor  . Sedimentation rate  . Uric acid  . Cyclic citrul peptide antibody, IgG  . Angiotensin converting enzyme  . 14-3-3 eta Protein   Meds ordered this encounter  Medications  . diclofenac sodium (VOLTAREN) 1 % GEL    Sig: Apply 3 gm to 3 large joints up to 3 times a day.Dispense 3 tubes with 3 refills.    Dispense:  3 Tube    Refill:  1    Face-to-face time spent with patient was 50 minutes.  Greater than 50% of time was spent in counseling and coordination of care.  Follow-Up Instructions: Return for Polyarthralgia.   Bo Merino, MD  Note - This record has been created using Editor, commissioning.  Chart creation errors have been sought, but may not always  have been located. Such creation errors do not reflect on  the standard of medical care.

## 2017-08-24 ENCOUNTER — Ambulatory Visit (INDEPENDENT_AMBULATORY_CARE_PROVIDER_SITE_OTHER): Payer: Self-pay

## 2017-08-24 ENCOUNTER — Ambulatory Visit: Payer: BLUE CROSS/BLUE SHIELD | Admitting: Rheumatology

## 2017-08-24 ENCOUNTER — Encounter: Payer: Self-pay | Admitting: Rheumatology

## 2017-08-24 VITALS — BP 121/83 | HR 92 | Resp 16 | Ht 63.5 in | Wt 188.5 lb

## 2017-08-24 DIAGNOSIS — Z8639 Personal history of other endocrine, nutritional and metabolic disease: Secondary | ICD-10-CM

## 2017-08-24 DIAGNOSIS — M79642 Pain in left hand: Secondary | ICD-10-CM | POA: Diagnosis not present

## 2017-08-24 DIAGNOSIS — M25571 Pain in right ankle and joints of right foot: Secondary | ICD-10-CM

## 2017-08-24 DIAGNOSIS — G5603 Carpal tunnel syndrome, bilateral upper limbs: Secondary | ICD-10-CM

## 2017-08-24 DIAGNOSIS — M797 Fibromyalgia: Secondary | ICD-10-CM

## 2017-08-24 DIAGNOSIS — M79641 Pain in right hand: Secondary | ICD-10-CM

## 2017-08-24 DIAGNOSIS — M25572 Pain in left ankle and joints of left foot: Secondary | ICD-10-CM

## 2017-08-24 DIAGNOSIS — G8929 Other chronic pain: Secondary | ICD-10-CM

## 2017-08-24 DIAGNOSIS — M2550 Pain in unspecified joint: Secondary | ICD-10-CM | POA: Diagnosis not present

## 2017-08-24 DIAGNOSIS — Z8659 Personal history of other mental and behavioral disorders: Secondary | ICD-10-CM

## 2017-08-24 DIAGNOSIS — Z8669 Personal history of other diseases of the nervous system and sense organs: Secondary | ICD-10-CM | POA: Insufficient documentation

## 2017-08-24 DIAGNOSIS — M25551 Pain in right hip: Secondary | ICD-10-CM | POA: Diagnosis not present

## 2017-08-24 DIAGNOSIS — F5101 Primary insomnia: Secondary | ICD-10-CM

## 2017-08-24 DIAGNOSIS — E559 Vitamin D deficiency, unspecified: Secondary | ICD-10-CM

## 2017-08-24 DIAGNOSIS — G894 Chronic pain syndrome: Secondary | ICD-10-CM | POA: Diagnosis not present

## 2017-08-24 DIAGNOSIS — R5383 Other fatigue: Secondary | ICD-10-CM

## 2017-08-24 DIAGNOSIS — D369 Benign neoplasm, unspecified site: Secondary | ICD-10-CM

## 2017-08-24 DIAGNOSIS — M5137 Other intervertebral disc degeneration, lumbosacral region: Secondary | ICD-10-CM

## 2017-08-24 MED ORDER — DICLOFENAC SODIUM 1 % TD GEL: TRANSDERMAL | 1 refills | Status: DC

## 2017-08-24 NOTE — Patient Instructions (Signed)
 Natural anti-inflammatories  You can purchase these at [employer], [employer] or online.  . Turmeric (capsules)  . Ginger (ginger root or capsules)  . Omega 3 (Fish, flax seeds, chia seeds, walnuts, almonds)  . Tart cherry (dried or extract)   Patient should be under the care of a physician while taking these supplements. This may not be reproduced without the permission of Dr. Eyob Godlewski.   Hand Exercises Hand exercises can be helpful to almost anyone. These exercises can strengthen the hands, improve flexibility and movement, and increase blood flow to the hands. These results can make work and daily tasks easier. Hand exercises can be especially helpful for people who have joint pain from arthritis or have nerve damage from overuse (carpal tunnel syndrome). These exercises can also help people who have injured a hand. Most of these hand exercises are fairly gentle stretching routines. You can do them often throughout the day. Still, it is a good idea to ask your health care provider which exercises would be best for you. Warming your hands before exercise may help to reduce stiffness. You can do this with gentle massage or by placing your hands in warm water for 15 minutes. Also, make sure you pay attention to your level of hand pain as you begin an exercise routine. Exercises Knuckle Bend Repeat this exercise 5-10 times with each hand. 1. Stand or sit with your arm, hand, and all five fingers pointed straight up. Make sure your wrist is straight. 2. Gently and slowly bend your fingers down and inward until the tips of your fingers are touching the tops of your palm. 3. Hold this position for a few seconds. 4. Extend your fingers out to their original position, all pointing straight up again.  Finger Fan Repeat this exercise 5-10 times with each hand. 1. Hold your arm and hand out in front of you. Keep your wrist straight. 2. Squeeze your hand into a fist. 3. Hold this  position for a few seconds. 4. Fan out, or spread apart, your hand and fingers as much as possible, stretching every joint fully.  Tabletop Repeat this exercise 5-10 times with each hand. 1. Stand or sit with your arm, hand, and all five fingers pointed straight up. Make sure your wrist is straight. 2. Gently and slowly bend your fingers at the knuckles where they meet the hand until your hand is making an upside-down L shape. Your fingers should form a tabletop. 3. Hold this position for a few seconds. 4. Extend your fingers out to their original position, all pointing straight up again.  Making Os Repeat this exercise 5-10 times with each hand. 1. Stand or sit with your arm, hand, and all five fingers pointed straight up. Make sure your wrist is straight. 2. Make an O shape by touching your pointer finger to your thumb. Hold for a few seconds. Then open your hand wide. 3. Repeat this motion with each finger on your hand.  Table Spread Repeat this exercise 5-10 times with each hand. 1. Place your hand on a table with your palm facing down. Make sure your wrist is straight. 2. Spread your fingers out as much as possible. Hold this position for a few seconds. 3. Slide your fingers back together again. Hold for a few seconds.  Ball Grip  Repeat this exercise 10-15 times with each hand. 1. Hold a tennis ball or another soft ball in your hand. 2. While slowly increasing pressure, squeeze the ball as   hard as possible. 3. Squeeze as hard as you can for 3-5 seconds. 4. Relax and repeat.  Wrist Curls Repeat this exercise 10-15 times with each hand. 1. Sit in a chair that has armrests. 2. Hold a light weight in your hand, such as a dumbbell that weighs 1-3 pounds (0.5-1.4 kg). Ask your health care provider what weight would be best for you. 3. Rest your hand just over the end of the chair arm with your palm facing up. 4. Gently pivot your wrist up and down while holding the weight. Do not  twist your wrist from side to side.  Contact a health care provider if:  Your hand pain or discomfort gets much worse when you do an exercise.  Your hand pain or discomfort does not improve within 2 hours after you exercise. If you have any of these problems, stop doing these exercises right away. Do not do them again unless your health care provider says that you can. Get help right away if:  You develop sudden, severe hand pain. If this happens, stop doing these exercises right away. Do not do them again unless your health care provider says that you can. This information is not intended to replace advice given to you by your health care provider. Make sure you discuss any questions you have with your health care provider. Document Released: 03/18/2015 Document Revised: 09/12/2015 Document Reviewed: 10/15/2014 Elsevier Interactive Patient Education  2018 [employer]  

## 2017-08-28 LAB — URIC ACID: Uric Acid, Serum: 4 mg/dL (ref 2.5–7.0)

## 2017-08-28 LAB — 14-3-3 ETA PROTEIN: 14-3-3 eta Protein: <0.2 ng/mL (ref <0.2)

## 2017-08-28 LAB — SEDIMENTATION RATE: Sed Rate: 11 mm/h (ref 0–30)

## 2017-08-28 LAB — RHEUMATOID FACTOR: Rhuematoid fact SerPl-aCnc: <14 IU/mL (ref <14)

## 2017-08-28 LAB — ANGIOTENSIN CONVERTING ENZYME: Angiotensin-Converting Enzyme: 19 U/L (ref 9–67)

## 2017-08-28 LAB — CYCLIC CITRUL PEPTIDE ANTIBODY, IGG: Cyclic Citrullin Peptide Ab: <16 UNITS

## 2017-09-07 NOTE — Progress Notes (Signed)
Please refer patient for carpal tunnel release to orthopedic surgery.

## 2017-09-10 ENCOUNTER — Telehealth: Payer: Self-pay | Admitting: *Deleted

## 2017-09-10 NOTE — Telephone Encounter (Signed)
-----   Message from Bo Merino, MD sent at 09/07/2017  5:04 PM EDT ----- Please refer patient for carpal tunnel release to orthopedic surgery.

## 2017-09-10 NOTE — Telephone Encounter (Signed)
Please schedule patient with Dr. Durward Fortes for Carpal Tunnel release.

## 2017-09-16 ENCOUNTER — Encounter (INDEPENDENT_AMBULATORY_CARE_PROVIDER_SITE_OTHER): Payer: Self-pay | Admitting: Orthopaedic Surgery

## 2017-09-16 ENCOUNTER — Ambulatory Visit (INDEPENDENT_AMBULATORY_CARE_PROVIDER_SITE_OTHER): Payer: BLUE CROSS/BLUE SHIELD | Admitting: Orthopaedic Surgery

## 2017-09-16 VITALS — BP 119/85 | HR 106 | Ht 63.5 in | Wt 188.0 lb

## 2017-09-16 DIAGNOSIS — G5601 Carpal tunnel syndrome, right upper limb: Secondary | ICD-10-CM | POA: Diagnosis not present

## 2017-09-16 DIAGNOSIS — G5602 Carpal tunnel syndrome, left upper limb: Secondary | ICD-10-CM | POA: Diagnosis not present

## 2017-09-16 NOTE — Progress Notes (Addendum)
Office Visit Note   Patient: [name]           Date of Birth: 1965-12-17           MRN: [id number] Visit Date: 09/16/2017              Requested by: Girtha Rm, NP-C [place], [place] [id number] PCP: Girtha Rm, NP-C   Assessment & Plan: Visit Diagnoses:  1. Carpal tunnel syndrome, left upper limb   2. Carpal tunnel syndrome, right upper limb     Plan: Symptoms consistent with bilateral carpal tunnel syndrome.  Has had some studies either ultrasound or EMGs performed at her pain clinic.  Need to obtain those before discussing treatment options.  If EMGs and nerve conduction studies have not been performed I will order these. Received the faxed report from [facility].  EMGs and nerve conduction studies were performed November 2017 demonstrating prolonged distal latencies and the motor and sensory portions of median nerves bilaterally.  Called [name] with the results.  She would like to try physical therapy before considering surgery.  We will set this up  Follow-Up Instructions: Return if symptoms worsen or fail to improve.   Orders:  No orders of the defined types were placed in this encounter.  No orders of the defined types were placed in this encounter.     Procedures: No procedures performed   Clinical Data: No additional findings.   Subjective: Chief Complaint  Patient presents with  . Right Hand - Pain  . Left Hand - Pain  . New Patient (Initial Visit)    BI LAT CARPAL TUNNEL FOR 2 YRS, NO INJURY, NO SURGERIES. INJECTION IN R HAND 08/18  [name] is 52 years old visited the office for evaluation of bilateral hand pain.  She has been involved in a pain clinic evaluation for a long time with a history of fibromyalgia and multiple joint complaints.  She recently was evaluated by Dr. Estanislado Pandy.  At the pain clinic she was diagnosed with bilateral carpal tunnel syndrome apparently with ultrasound.  We  do not have those results.  She has been wearing splints.  She has trouble at night trying to sleep.  She apparently has had at least one an injection into the carpal canal on either the right of the left.  She is left-hand dominant.  She has numbness and tingling in all occasionally will drop objects.  No injury or trauma.  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  Has history of diabetes. Has had prior films of both hands which I reviewed on the PACS system.  Minimal degenerative changes at the base of the thumbs.  No acute changes or ectopic calcification  HPI  Review of Systems  Constitutional: Positive for fatigue.  HENT: Negative for ear pain.   Eyes: Negative for pain.  Respiratory: Negative for cough and shortness of breath.   Cardiovascular: Negative for leg swelling.  Gastrointestinal: Negative for constipation and diarrhea.  Genitourinary: Negative for difficulty urinating.  Musculoskeletal: Positive for back pain. Negative for neck pain.  Skin: Negative for rash.  Allergic/Immunologic: Positive for food allergies.  Neurological: Positive for weakness and numbness.  Hematological: Does not bruise/bleed easily.  Psychiatric/Behavioral: Positive for sleep disturbance.     Objective: Vital Signs: BP 119/85 (BP Location: Left Arm, Patient Position: Sitting, Cuff Size: Normal)   Pulse (!) 106   Ht 5' 3.5" (1.613 m)  Wt 188 lb (85.3 kg)   BMI 32.78 kg/m   Physical Exam  Constitutional: She is oriented to person, place, and time. She appears well-developed and well-nourished.  HENT:  Mouth/Throat: Oropharynx is clear and moist.  Eyes: Pupils are equal, round, and reactive to light. EOM are normal.  Pulmonary/Chest: Effort normal.  Neurological: She is alert and oriented to person, place, and time.  Skin: Skin is warm and dry.  Psychiatric: She has a normal mood and affect. Her behavior is normal.    Ortho Exam awake alert and oriented x3.  Comfortable  sitting.  Positive Tinel's and Phalen's over the median nerve both wrists.  Full range of motion without swelling.  Good opposition of thumb the little finger capillary refill to all fingers.  Specialty Comments:  No specialty comments available.  Imaging: No results found.   PMFS History: Patient Active Problem List   Diagnosis Date Noted  . Carpal tunnel syndrome, left upper limb 09/16/2017  . Carpal tunnel syndrome, right upper limb 09/16/2017  . History of migraine 08/24/2017  . Diabetes ([place])   . Vitamin D deficiency 01/08/2017  . Elevated LDL cholesterol level 08/10/2016  . Adenomatous polyps 07/13/2016  . Leg cramp 06/25/2015  . Arthritis, senescent 06/25/2015  . Hip bursitis 06/25/2015  . 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 Chronic pain syndrome 06/25/2015  . Muscle pain, fibromyalgia 06/25/2015  . Fibromyalgia 06/17/2015   Past Medical History:  Diagnosis Date  . Arthritis   . Bursitis   . Carpal tunnel syndrome   . Chronic headaches   . Constipation   . Depression   . Diabetes ([place])    diet controlled, no meds  . Fibromyalgia   . Insomnia   . Snoring   . Tendonitis   . TMJ (dislocation of temporomandibular joint)     Family History  Problem Relation Age of Onset  . Hypertension Mother   . Glaucoma Mother   . Cataracts Mother   . Diabetes Father   . Hypertension Father   . Glaucoma Brother   . Ataxia Brother   . Fibromyalgia Daughter   . Migraines Daughter   . Migraines Daughter   . Asthma Daughter   . Colon cancer Neg Hx   . Esophageal cancer Neg Hx   . Pancreatic cancer Neg Hx   . Rectal cancer Neg Hx   . Stomach cancer Neg Hx     Past Surgical History:  Procedure Laterality Date  . ABDOMINAL HYSTERECTOMY    . CESAREAN SECTION     x3  . cyst from hand    . DENTAL SURGERY    . KNEE SURGERY     right  . TEMPOROMANDIBULAR JOINT SURGERY    . TONSILLECTOMY     Social History   Occupational History  . Not on file  Tobacco Use  . Smoking  status: Never Smoker  . Smokeless tobacco: Never Used  Substance and Sexual Activity  . Alcohol use: No  . Drug use: Not Currently  . Sexual activity: Yes    Birth control/protection: Surgical

## 2017-09-17 ENCOUNTER — Other Ambulatory Visit (INDEPENDENT_AMBULATORY_CARE_PROVIDER_SITE_OTHER): Payer: Self-pay | Admitting: Radiology

## 2017-09-17 DIAGNOSIS — M79641 Pain in right hand: Secondary | ICD-10-CM

## 2017-09-17 DIAGNOSIS — M79642 Pain in left hand: Secondary | ICD-10-CM

## 2017-09-21 NOTE — Progress Notes (Signed)
Office Visit Note  Patient: [name]             Date of Birth: 01-Jun-1965           MRN: [id number]             PCP: Girtha Rm, NP-C Referring: Girtha Rm, NP-C Visit Date: 09/28/2017 Occupation: '@GUAROCC'$ @    Subjective:  Pain in hands and lower back.   History of Present Illness: [name] is a 52 y.o. female with history of osteoarthritis, DDD and fibromyalgia.  She states she continues to have discomfort in her bilateral hands and her lower back.  She reports intermittent swelling in her right hand.  She started natural anti-inflammatories after the last visit and she has noticed some improvement.  She has been doing exercises as well.  Activities of Daily Living:  Patient reports morning stiffness for 20-30 minutes.   Patient Reports nocturnal pain.  Difficulty dressing/grooming: Reports Difficulty climbing stairs: Reports Difficulty getting out of chair: Reports Difficulty using hands for taps, buttons, cutlery, and/or writing: Reports   Review of Systems  Constitutional: Positive for fatigue. Negative for fever, night sweats, weight gain and weight loss.  HENT: Negative for ear pain, mouth sores, trouble swallowing, trouble swallowing, mouth dryness and nose dryness.   Eyes: Negative for pain, redness, visual disturbance and dryness.  Respiratory: Negative for cough, shortness of breath and difficulty breathing.   Cardiovascular: Negative for chest pain, palpitations, hypertension, irregular heartbeat and swelling in legs/feet.  Gastrointestinal: Negative for blood in stool, constipation and diarrhea.  Endocrine: Negative for increased urination.  Genitourinary: Negative for difficulty urinating and vaginal dryness.  Musculoskeletal: Positive for arthralgias, joint pain, joint swelling, myalgias, muscle weakness, morning stiffness and myalgias. Negative for muscle tenderness.  Skin: Negative for color change, rash, hair loss, skin tightness, ulcers and  sensitivity to sunlight.  Allergic/Immunologic: Negative for susceptible to infections.  Neurological: Negative for dizziness, numbness, memory loss, night sweats and weakness.  Hematological: Negative for bruising/bleeding tendency and swollen glands.  Psychiatric/Behavioral: Positive for depressed mood and sleep disturbance. The patient is not nervous/anxious.     PMFS History:  Patient Active Problem List   Diagnosis Date Noted  . Carpal tunnel syndrome, left upper limb 09/16/2017  . Carpal tunnel syndrome, right upper limb 09/16/2017  . History of migraine 08/24/2017  . Diabetes ([place])   . Vitamin D deficiency 01/08/2017  . Elevated LDL cholesterol level 08/10/2016  . Adenomatous polyps 07/13/2016  . Leg cramp 06/25/2015  . Primary osteoarthritis of both hands 06/25/2015  . Hip bursitis 06/25/2015  . 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 Chronic pain syndrome 06/25/2015  . Muscle pain, fibromyalgia 06/25/2015  . Fibromyalgia 06/17/2015    Past Medical History:  Diagnosis Date  . Arthritis   . Bursitis   . Carpal tunnel syndrome   . Chronic headaches   . Constipation   . Depression   . Diabetes ([place])    diet controlled, no meds  . Fibromyalgia   . Insomnia   . Snoring   . Tendonitis   . TMJ (dislocation of temporomandibular joint)     Family History  Problem Relation Age of Onset  . Hypertension Mother   . Glaucoma Mother   . Cataracts Mother   . Diabetes Father   . Hypertension Father   . Glaucoma Brother   . Ataxia Brother   . Fibromyalgia Daughter   . Migraines Daughter   . Migraines Daughter   .  Asthma Daughter   . Colon cancer Neg Hx   . Esophageal cancer Neg Hx   . Pancreatic cancer Neg Hx   . Rectal cancer Neg Hx   . Stomach cancer Neg Hx    Past Surgical History:  Procedure Laterality Date  . ABDOMINAL HYSTERECTOMY    . CESAREAN SECTION     x3  . cyst from hand    . DENTAL SURGERY    . KNEE SURGERY     right  . TEMPOROMANDIBULAR JOINT SURGERY      . TONSILLECTOMY     Social History   Social History Narrative  . Not on file     Objective: Vital Signs: BP 114/81 (BP Location: Left Arm, Patient Position: Sitting, Cuff Size: Normal)   Pulse 96   Ht 5' 3.5" (1.613 m)   Wt 188 lb (85.3 kg)   BMI 32.78 kg/m    Physical Exam  Constitutional: She is oriented to person, place, and time. She appears well-developed and well-nourished.  HENT:  Head: Normocephalic and atraumatic.  Eyes: Conjunctivae and EOM are normal.  Neck: Normal range of motion.  Cardiovascular: Normal rate, regular rhythm, normal heart sounds and intact distal pulses.  Pulmonary/Chest: Effort normal and breath sounds normal.  Abdominal: Soft. Bowel sounds are normal.  Lymphadenopathy:    She has no cervical adenopathy.  Neurological: She is alert and oriented to person, place, and time.  Skin: Skin is warm and dry. Capillary refill takes less than 2 seconds.  Psychiatric: She has a normal mood and affect. Her behavior is normal.  Nursing note and vitals reviewed.    Musculoskeletal Exam: C-spine thoracic spine good range of motion.  She has discomfort range of motion of her lumbar spine.  Shoulder joints elbow joints wrist joints with good range of motion.  She has tenderness over bilateral wrist joints and right third PIP joint.  No synovitis was noted.  Hip joints knee joints ankles MTPs PIPs were in good range of motion with no synovitis.  She has generalized hyperalgesia.  CDAI Exam: No CDAI exam completed.    Investigation: No additional findings. Aug 24, 2017 ESR 11, uric acid 4.0, ACE negative, RF negative, anti-CCP negative, '14 3 3 '$ et a negative  Imaging: No results found.  Speciality Comments: No specialty comments available.    Procedures:  No procedures performed Allergies: Amoxicillin; Doxycycline; and Penicillins   Assessment / Plan:     Visit Diagnoses: Primary osteoarthritis of both hands-she has osteoarthritic changes in her  bilateral hands.  She also gives history of intermittent swelling.  We will schedule ultrasound of her bilateral hands to evaluate this further.  She was given a handout on exercises which she has been doing.  She has been also referred to physical therapy by Dr. Durward Fortes.  She is seeing Dr. Durward Fortes for right carpal tunnel syndrome.  Pain in both hands - History of intermittent swelling.  Her autoimmune work-up has been negative.   Polyarthralgia - X-ray of bilateral hip joints were unremarkable.  DDD (degenerative disc disease), lumbar-she has chronic pain and discomfort in her lower back.  I have discussed water aerobics weight loss and exercise.  Handout on back exercises was given last visit.  Fibromyalgia-she continues to have generalized pain and discomfort.  Chronic pain syndrome-she has been followed up at the pain clinic.  Other fatigue-related to insomnia.  Other insomnia -good sleep hygiene was discussed.   Other medical problems are listed as follows:  Adenomatous polyps  History of diabetes mellitus, type II  Vitamin D deficiency-she is on vitamin D supplement.  History of depression-she is on Cymbalta.  History of migraine -she takes Excedrin Migraine on as needed basis.    Orders: No orders of the defined types were placed in this encounter.  No orders of the defined types were placed in this encounter.   Face-to-face time spent with patient was 30 minutes.> 50% of time was spent in counseling and coordination of care.  Follow-Up Instructions: Return in about 6 months (around 03/30/2018) for Osteoarthritis, FMS.   Bo Merino, MD  Note - This record has been created using Editor, commissioning.  Chart creation errors have been sought, but may not always  have been located. Such creation errors do not reflect on  the standard of medical care.

## 2017-09-28 ENCOUNTER — Encounter: Payer: Self-pay | Admitting: Rheumatology

## 2017-09-28 ENCOUNTER — Ambulatory Visit: Payer: BLUE CROSS/BLUE SHIELD | Admitting: Rheumatology

## 2017-09-28 VITALS — BP 114/81 | HR 96 | Ht 63.5 in | Wt 188.0 lb

## 2017-09-28 DIAGNOSIS — M797 Fibromyalgia: Secondary | ICD-10-CM

## 2017-09-28 DIAGNOSIS — G894 Chronic pain syndrome: Secondary | ICD-10-CM

## 2017-09-28 DIAGNOSIS — M5136 Other intervertebral disc degeneration, lumbar region: Secondary | ICD-10-CM

## 2017-09-28 DIAGNOSIS — M2550 Pain in unspecified joint: Secondary | ICD-10-CM

## 2017-09-28 DIAGNOSIS — G4709 Other insomnia: Secondary | ICD-10-CM

## 2017-09-28 DIAGNOSIS — M19041 Primary osteoarthritis, right hand: Secondary | ICD-10-CM | POA: Diagnosis not present

## 2017-09-28 DIAGNOSIS — M19042 Primary osteoarthritis, left hand: Secondary | ICD-10-CM

## 2017-09-28 DIAGNOSIS — M51369 Other intervertebral disc degeneration, lumbar region without mention of lumbar back pain or lower extremity pain: Secondary | ICD-10-CM

## 2017-09-28 DIAGNOSIS — R5383 Other fatigue: Secondary | ICD-10-CM | POA: Diagnosis not present

## 2017-09-28 DIAGNOSIS — M79641 Pain in right hand: Secondary | ICD-10-CM | POA: Diagnosis not present

## 2017-09-28 DIAGNOSIS — M79642 Pain in left hand: Secondary | ICD-10-CM

## 2017-10-01 ENCOUNTER — Other Ambulatory Visit: Payer: Self-pay | Admitting: Family Medicine

## 2017-10-04 NOTE — Telephone Encounter (Signed)
Left message for pt to call the office back and schedule DM check. Once scheduled we can refill med for 30 days

## 2017-10-07 ENCOUNTER — Ambulatory Visit: Payer: BLUE CROSS/BLUE SHIELD | Admitting: Family Medicine

## 2017-10-07 ENCOUNTER — Encounter: Payer: Self-pay | Admitting: Family Medicine

## 2017-10-07 VITALS — BP 124/80 | HR 100 | Ht 63.5 in | Wt 187.0 lb

## 2017-10-07 DIAGNOSIS — E119 Type 2 diabetes mellitus without complications: Secondary | ICD-10-CM

## 2017-10-07 LAB — POCT GLYCOSYLATED HEMOGLOBIN (HGB A1C): Hemoglobin A1C: 6.1 % — AB (ref 4.0–5.6)

## 2017-10-07 MED ORDER — SEMAGLUTIDE(0.25 OR 0.5MG/DOS) 2 MG/1.5ML ~~LOC~~ SOPN: 0.5000 mg | PEN_INJECTOR | SUBCUTANEOUS | 3 refills | Status: DC

## 2017-10-07 NOTE — Progress Notes (Signed)
  Subjective:    Patient ID: [name], female    DOB: 1965/09/03, 52 y.o.   MRN: [id number]  Heather Mckendree is a 52 y.o. female who presents for follow-up of Type 2 diabetes mellitus.  She is no longer in pain management. She is going to a rheumatologist now. Dr. Estanislado Pandy. Dr. Durward Fortes is her orthopedist.  Taking vitamin B12 for energy. Taking Turmeric for inflammation.  She is taking melatonin for sleep.   Patient is checking home blood sugars.   Home blood sugar records: BGs range between 103 and 113 How often is blood sugars being checked: checking every other day Current symptoms include: none. Patient denies nausea, visual disturbances, vomiting and weight loss.  Patient is checking their feet daily. Any Foot concerns (callous, ulcer, wound, thickened nails, toenail fungus, skin fungus, hammer toe): none Last dilated eye exam: 6 months ago- [facility]  Current treatments:doing well on DM med Ozempic.  Medication compliance: excellent  Current diet: in general, a "healthy" diet   Current exercise: water excerise Known diabetic complications: none  The following portions of the patient's history were reviewed and updated as appropriate: allergies, current medications, past medical history, past social history and problem list.  ROS as in subjective above.     Objective:    Physical Exam Alert and in no distress otherwise not examined.  Blood pressure 124/80, pulse 100, height 5' 3.5" (1.613 m), weight 187 lb (84.8 kg).  Lab Review Diabetic Labs Latest Ref Rng & Units 10/07/2017 05/21/2017 12/07/2016 09/28/2016 08/10/2016  HbA1c 4.0 - 5.6 % 6.1(A) 6.0% 6.8% - 6.7%  Microalbumin Not estab mg/dL - - - - 0.3  Micro/Creat Ratio <30 mcg/mg creat - - - - 2  Chol 100 - 199 mg/dL - 162 - 158 -  HDL >39 mg/dL - 58 - 58 -  Calc LDL 0 - 99 mg/dL - 89 - 88 -  Triglycerides 0 - 149 mg/dL - 74 - 62 -  Creatinine 0.57 - 1.00 mg/dL - 0.96 - - -   BP/Weight 10/07/2017 09/28/2017 09/16/2017  09/26/6293 05/28/4130  Systolic BP 440 102 725 366 440  Diastolic BP 80 81 85 83 80  Wt. (Lbs) 187 188 188 188.5 188.2  BMI 32.61 32.78 32.78 32.87 28.62   Foot/eye exam completion dates Latest Ref Rng & Units 12/11/2016 12/07/2016  Eye Exam No Retinopathy No Retinopathy -  Foot Form Completion - - Done    Salvador  reports that she has never smoked. She has never used smokeless tobacco. She reports that she has current or past drug history. She reports that she does not drink alcohol.     Assessment & Plan:    Type 2 diabetes mellitus without complication, without long-term current use of insulin (HCC) - Plan: Microalbumin/Creatinine Ratio, Urine, HgB A1c, Semaglutide (OZEMPIC) 0.25 or 0.5 MG/DOSE SOPN  1. Rx changes: none Hgb A1c is 6.1% and well controlled.  2. Education: Reviewed 'ABCs' of diabetes management (respective goals in parentheses):  A1C (<7), blood pressure (<130/80), and cholesterol (LDL <100). 3. Compliance at present is estimated to be good. Efforts to improve compliance (if necessary) will be directed at dietary modifications: cut back on sugars and carbohydrates, increased exercise and regular blood sugar monitoring: 3-4 times weekly. 4. Follow up: 6 months

## 2017-10-08 LAB — MICROALBUMIN / CREATININE URINE RATIO
Creatinine, Urine: 307.9 mg/dL
Microalb/Creat Ratio: 4 mg/g creat (ref 0.0–30.0)
Microalbumin, Urine: 12.4 ug/mL

## 2017-11-02 ENCOUNTER — Ambulatory Visit: Payer: BLUE CROSS/BLUE SHIELD | Attending: Orthopaedic Surgery | Admitting: Occupational Therapy

## 2017-11-02 ENCOUNTER — Other Ambulatory Visit: Payer: Self-pay

## 2017-11-02 DIAGNOSIS — M25641 Stiffness of right hand, not elsewhere classified: Secondary | ICD-10-CM | POA: Diagnosis not present

## 2017-11-02 DIAGNOSIS — M79641 Pain in right hand: Secondary | ICD-10-CM

## 2017-11-02 DIAGNOSIS — M25642 Stiffness of left hand, not elsewhere classified: Secondary | ICD-10-CM | POA: Diagnosis not present

## 2017-11-02 DIAGNOSIS — M79642 Pain in left hand: Secondary | ICD-10-CM | POA: Diagnosis not present

## 2017-11-02 DIAGNOSIS — R208 Other disturbances of skin sensation: Secondary | ICD-10-CM | POA: Diagnosis not present

## 2017-11-02 DIAGNOSIS — M6281 Muscle weakness (generalized): Secondary | ICD-10-CM | POA: Diagnosis not present

## 2017-11-02 NOTE — Therapy (Signed)
[facility] [address] [place], [place], [address] Phone: [phone]   Fax:  [phone]  Occupational Therapy Evaluation  Patient Details  Name: [name] MRN: [id number] Date of Birth: Aug 30, 1965 Referring Provider: Dr. Joni Fears   Encounter Date: 11/02/2017  OT End of Session - 11/02/17 1421    Visit Number  1    Number of Visits  9    Date for OT Re-Evaluation  12/03/17    Authorization Type  BC/BS - used 3 visits/30 prior to OT evaluation (combined OT, PT, Chiro)    Authorization - Visit Number  4    Authorization - Number of Visits  30    OT Start Time  8453    OT Stop Time  1405    OT Time Calculation (min)  50 min    Activity Tolerance  Patient tolerated treatment well    Behavior During Therapy  [facility] for tasks assessed/performed       Past Medical History:  Diagnosis Date  . Arthritis   . Bursitis   . Carpal tunnel syndrome   . Chronic headaches   . Constipation   . Depression   . Diabetes ([place])    diet controlled, no meds  . Fibromyalgia   . Insomnia   . Snoring   . Tendonitis   . TMJ (dislocation of temporomandibular joint)     Past Surgical History:  Procedure Laterality Date  . ABDOMINAL HYSTERECTOMY    . CESAREAN SECTION     x3  . cyst from hand    . DENTAL SURGERY    . KNEE SURGERY     right  . TEMPOROMANDIBULAR JOINT SURGERY    . TONSILLECTOMY      There were no vitals filed for this visit.  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 PMH: OA bilateral hands, fibromyalgia, DDD lower back pain, TMJ pain, chronic pain syndrome    Patient Stated Goals  Be able to work without being in pain    Currently in Pain?  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 Joni Fears    Onset Date/Surgical Date  -- approx 1 year ago    Hand Dominance  Left    Next MD Visit  11/17/17      Precautions   Precautions  None      Balance Screen   Has the patient fallen in the past 6 months  No    Has the patient had a decrease in activity level because of a fear of falling?   No    Is the patient reluctant  to leave their home because of a fear of falling?   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 Pt sometimes has to go to the rooms and fill in (ages 35-5)    Leisure  playing with grandkids      ADL   Eating/Feeding  Modified independent difficulty cutting food    Grooming  Modified independent has to switch hands at times    Upper Body Bathing  Independent    Lower Body Bathing  Independent    Upper Body Dressing  Increased time clasping bra with difficulty    Lower Body Dressing  Increased time w/ difficulty    Toilet Transfer  Independent    Tub/Shower Transfer  Independent      IADL   Shopping  Takes care of all shopping needs independently but pain with lifting    Light Housekeeping  -- Mod I    Meal Prep  Plans, prepares and serves adequate meals independently w/ modifications     Community Mobility  Drives own vehicle      Mobility   Mobility Status  Independent      Written Expression    Dominant Hand  Left    Handwriting  -- denies change      Vision - History   Baseline Vision  Wears glasses only for reading      Observation/Other Assessments   Observations  Pt tender to touch Rt long finger over PIP joint dorsally with popping during flexion/extension. Pt slightly swollen at Rt long finger PIP joint       Sensation   Light Touch  Appears Intact    Additional Comments  Pt reports increased tingling and numbness at night      Coordination   9 Hole Peg Test  Right;Left    Right 9 Hole Peg Test  23.94 sec    Left 9 Hole Peg Test  17.22 sec      Edema   Edema  Pt reports increased edema in the am bilateral hands      ROM / Strength   AROM / PROM / Strength  AROM      AROM   Overall AROM Comments  BUE AROM WFL's except limited PIP flexion Rt hand digits 2-5, mostly Rt long finger. Rt long PIP flex = 92* (Lt = 100*), PIP ext full w/ difficulty and noted hyperextension DIP joint      Hand Function   Right Hand Grip (lbs)  20 lbs    Right Hand Lateral Pinch  13 lbs    Right Hand 3 Point Pinch  10 lbs    Left Hand Grip (lbs)  65 lbs    Left Hand Lateral Pinch  21 lbs    Left 3 point pinch  17.5 lbs               OT Treatments/Exercises (OP) - 11/02/17 0001      ADLs   ADL Comments  Recommended isotoner gloves in the winter. Also recommended exercising in pool and swimming for OA, DDD of back, and for pain management in general for fibromyalgia (as pt has access to pool working at the [facility])  Splinting   Splinting  Pt issued pre-fab D ring splint for Rt wrist (Rt hand worse, and pt reports having brace for Lt wrist). Reviewed wear and care. Pt also issued compression glove for Rt hand to wear under brace at night for pain management            OT Education - 11/02/17 1418    Education Details  D-ring splint wear and care, compression glove wear and care    Person(s) Educated  Patient    Methods  Explanation;Demonstration    Comprehension   Verbalized understanding          OT Long Term Goals - 11/02/17 1423      OT LONG TERM GOAL #1   Title  Independent with HEP (specific to CTS) and putty HEP - 12/03/17    Time  4    Period  Weeks    Status  New      OT LONG TERM GOAL #2   Title  Pt to verbalize understanding with pain managment strategies including use of brace, compression glove, modalities    Time  4    Period  Weeks    Status  New      OT LONG TERM GOAL #3   Title  Pt to verbalize understanding with task modifications and/or A/E prn for increase ease with functional tasks    Time  4    Period  Weeks    Status  New      OT LONG TERM GOAL #4   Title  Pt to improve grip strength Rt hand to 35 lbs or greater for assisting in opening jars/containers    Baseline  eval = 20 lbs    Time  4    Period  Weeks    Status  New      OT LONG TERM GOAL #5   Title  Pt to improve lateral and 3 tip pinch each by 3 lbs Rt hand     Baseline  eval: lat = 13, 3 tip = 10    Time  4    Period  Weeks    Status  New            Plan - 11/02/17 1426    Clinical Impression Statement  Pt is a 52 y.o. female who presents to outpatient rehab for O.T. services due to bilateral CTS and bilateral hand pain (Rt worse than Lt). Pt also has OA bilateral hands and fibromyalgia which may contribute to pain. Pt also point tender and has popping at Rt long finger PIP joint. Pt would benefit from O.T. to address pain, strength, stiffness, and increase ease with ADLS and work related tasks    Occupational Profile and client history currently impacting functional performance  PMH: OA bilateral hands, fibromyalgia, DDD lower back. 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    Occupational performance deficits (Please refer to evaluation for details):  ADL's;IADL's;Work;Leisure;Rest and Sleep    Rehab Potential  Good    Current Impairments/barriers affecting progress:  time since  onset, comorbidities    OT Frequency  2x / week    OT Duration  4 weeks OR 1x/wk for 8 weeks (depending on co-pay)    OT Treatment/Interventions  Self-care/ADL training;Moist Heat;Fluidtherapy;DME and/or AE instruction;Splinting;Compression bandaging;Therapeutic activities;Therapeutic exercise;Ultrasound;Cryotherapy;Passive range of motion;Electrical Stimulation;Paraffin;Manual Therapy;Patient/family education    Plan  fluidotherapy for pain, assess how splint and compression glove  is helping Rt hand, issue glove for Lt hand prn, issue and review CTS conservative managment handout and lower median n. gliding handout, foam for pen?     Clinical Decision Making  Limited treatment options, no task modification necessary    Consulted and Agree with Plan of Care  Patient       Patient will benefit from skilled therapeutic intervention in order to improve the following deficits and impairments:  Decreased coordination, Decreased range of motion, Increased edema, Impaired sensation, Impaired UE functional use, Pain, Decreased knowledge of use of DME, Decreased strength  Visit Diagnosis: Pain in right hand - Plan: Ot plan of care cert/re-cert  Pain in left hand - Plan: Ot plan of care cert/re-cert  Muscle weakness (generalized) - Plan: Ot plan of care cert/re-cert  Other disturbances of skin sensation - Plan: Ot plan of care cert/re-cert  Stiffness of right hand, not elsewhere classified - Plan: Ot plan of care cert/re-cert  Stiffness of left hand, not elsewhere classified - Plan: Ot plan of care cert/re-cert    Problem List Patient Active Problem List   Diagnosis Date Noted  . Carpal tunnel syndrome, left upper limb 09/16/2017  . Carpal tunnel syndrome, right upper limb 09/16/2017  . History of migraine 08/24/2017  . Diabetes ([place])   . Vitamin D deficiency 01/08/2017  . Elevated LDL cholesterol level 08/10/2016  . Adenomatous polyps 07/13/2016  . Leg cramp 06/25/2015  . Primary  osteoarthritis of both hands 06/25/2015  . Hip bursitis 06/25/2015  . 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 Chronic pain syndrome 06/25/2015  . Muscle pain, fibromyalgia 06/25/2015  . Fibromyalgia 06/17/2015    Carey Bullocks, OTR/L 11/02/2017, 2:37 PM  [facility] [address] [place], [place], [address] Phone: [phone]   Fax:  [phone]  Name: [name] MRN: [id number] Date of Birth: 05-05-1965

## 2017-11-12 ENCOUNTER — Ambulatory Visit: Payer: BLUE CROSS/BLUE SHIELD | Admitting: Occupational Therapy

## 2017-11-12 DIAGNOSIS — M79642 Pain in left hand: Secondary | ICD-10-CM

## 2017-11-12 DIAGNOSIS — M25642 Stiffness of left hand, not elsewhere classified: Secondary | ICD-10-CM

## 2017-11-12 DIAGNOSIS — R208 Other disturbances of skin sensation: Secondary | ICD-10-CM

## 2017-11-12 DIAGNOSIS — M6281 Muscle weakness (generalized): Secondary | ICD-10-CM | POA: Diagnosis not present

## 2017-11-12 DIAGNOSIS — M79641 Pain in right hand: Secondary | ICD-10-CM

## 2017-11-12 DIAGNOSIS — M25641 Stiffness of right hand, not elsewhere classified: Secondary | ICD-10-CM | POA: Diagnosis not present

## 2017-11-12 NOTE — Therapy (Signed)
[facility] [address] [place], [place], [address] Phone: [phone]   Fax:  [phone]  Occupational Therapy Treatment  Patient Details  Name: [name] MRN: [id number] Date of Birth: 1965-05-12 Referring Provider: Dr. Joni Fears   Encounter Date: 11/12/2017  OT End of Session - 11/12/17 1051    Visit Number  2    Number of Visits  9    Date for OT Re-Evaluation  12/03/17    Authorization Type  BC/BS - used 3 visits/30 prior to OT evaluation (combined OT, PT, Chiro)    Authorization - Visit Number  5    Authorization - Number of Visits  30    OT Start Time  0933    OT Stop Time  1015    OT Time Calculation (min)  42 min    Activity Tolerance  Patient tolerated treatment well    Behavior During Therapy  [facility] for tasks assessed/performed       Past Medical History:  Diagnosis Date  . Arthritis   . Bursitis   . Carpal tunnel syndrome   . Chronic headaches   . Constipation   . Depression   . Diabetes ([place])    diet controlled, no meds  . Fibromyalgia   . Insomnia   . Snoring   . Tendonitis   . TMJ (dislocation of temporomandibular joint)     Past Surgical History:  Procedure Laterality Date  . ABDOMINAL HYSTERECTOMY    . CESAREAN SECTION     x3  . cyst from hand    . DENTAL SURGERY    . KNEE SURGERY     right  . TEMPOROMANDIBULAR JOINT SURGERY    . TONSILLECTOMY      There were no vitals filed for this visit.  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 PMH: OA bilateral hands, fibromyalgia, DDD lower back pain, TMJ pain, chronic pain syndrome    Patient Stated Goals  Be able to work without being in pain    Currently in Pain?  Yes    Pain Score  5     Pain Location  Hand    Pain Orientation  Right;Left    Pain Type  Chronic pain    Pain Onset  More than a  month ago    Pain Frequency  Intermittent    Aggravating Factors   overuse, nighttime    Pain Relieving Factors  heat    Multiple Pain Sites  No                Treatment: Fluidotherapy x 10 mins to bilateral UE's for pain relief, no adverse reactions. Pt was issued edema glove for LUE, she verbalizes understanding of wear. Pt was educated regarding conservative treatment of CTS and issued median n. Gliding and tendon gliding ex.           OT Education - 11/12/17 1059    Education Details  inital HEP for tendon, n. gliding, conservative tx of CTS, edema glove and splint wear    Person(s) Educated  Patient    Methods  Explanation;Demonstration;Verbal cues;Handout    Comprehension  Verbalized understanding;Returned demonstration          OT Long Term Goals - 11/02/17 1423      OT LONG TERM GOAL #1   Title  Independent with  HEP (specific to CTS) and putty HEP - 12/03/17    Time  4    Period  Weeks    Status  New      OT LONG TERM GOAL #2   Title  Pt to verbalize understanding with pain managment strategies including use of brace, compression glove, modalities    Time  4    Period  Weeks    Status  New      OT LONG TERM GOAL #3   Title  Pt to verbalize understanding with task modifications and/or A/E prn for increase ease with functional tasks    Time  4    Period  Weeks    Status  New      OT LONG TERM GOAL #4   Title  Pt to improve grip strength Rt hand to 35 lbs or greater for assisting in opening jars/containers    Baseline  eval = 20 lbs    Time  4    Period  Weeks    Status  New      OT LONG TERM GOAL #5   Title  Pt to improve lateral and 3 tip pinch each by 3 lbs Rt hand     Baseline  eval: lat = 13, 3 tip = 10    Time  4    Period  Weeks    Status  New            Plan - 11/12/17 1055    Clinical Impression Statement  Pt is progressing towards goals. She demonstates understanding of inital HEP and splint wear.    Occupational Profile  and client history currently impacting functional performance  PMH: OA bilateral hands, fibromyalgia, DDD lower back. 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    Occupational performance deficits (Please refer to evaluation for details):  ADL's;IADL's;Work;Leisure;Rest and Sleep    Rehab Potential  Good    Current Impairments/barriers affecting progress:  time since onset, comorbidities    OT Frequency  2x / week    OT Duration  4 weeks    OT Treatment/Interventions  Self-care/ADL training;Moist Heat;Fluidtherapy;DME and/or AE instruction;Splinting;Compression bandaging;Therapeutic activities;Therapeutic exercise;Ultrasound;Cryotherapy;Passive range of motion;Electrical Stimulation;Paraffin;Manual Therapy;Patient/family education    Plan  fluidotherapy for pain,  issue and review tendon gliding, and n. gliding ex,  foam for pen?     Clinical Decision Making  Limited treatment options, no task modification necessary    Consulted and Agree with Plan of Care  Patient       Patient will benefit from skilled therapeutic intervention in order to improve the following deficits and impairments:  Decreased coordination, Decreased range of motion, Increased edema, Impaired sensation, Impaired UE functional use, Pain, Decreased knowledge of use of DME, Decreased strength  Visit Diagnosis: Pain in right hand  Pain in left hand  Muscle weakness (generalized)  Other disturbances of skin sensation  Stiffness of right hand, not elsewhere classified  Stiffness of left hand, not elsewhere classified    Problem List Patient Active Problem List   Diagnosis Date Noted  . Carpal tunnel syndrome, left upper limb 09/16/2017  . Carpal tunnel syndrome, right upper limb 09/16/2017  . History of migraine 08/24/2017  . Diabetes ([place])   . Vitamin D deficiency 01/08/2017  . Elevated LDL cholesterol level 08/10/2016  . Adenomatous polyps  07/13/2016  . Leg cramp 06/25/2015  . Primary osteoarthritis of both hands 06/25/2015  .  Hip bursitis 06/25/2015  . 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 Chronic pain syndrome 06/25/2015  . Muscle pain, fibromyalgia 06/25/2015  . Fibromyalgia 06/17/2015    RINE,KATHRYN 11/12/2017, 11:00 AM Theone Murdoch, OTR/L [phone] Phone: [phone] 11:01 AM 11/12/17 [facility] [address] [place], [place], [address] Phone: [phone]   Fax:  [phone]  Name: Audra Kagel MRN: [id number] Date of Birth: June 03, 1965

## 2017-11-17 ENCOUNTER — Ambulatory Visit (INDEPENDENT_AMBULATORY_CARE_PROVIDER_SITE_OTHER): Payer: BLUE CROSS/BLUE SHIELD | Admitting: Rheumatology

## 2017-11-17 ENCOUNTER — Ambulatory Visit (INDEPENDENT_AMBULATORY_CARE_PROVIDER_SITE_OTHER): Payer: Self-pay

## 2017-11-17 ENCOUNTER — Encounter: Payer: BLUE CROSS/BLUE SHIELD | Admitting: Occupational Therapy

## 2017-11-17 DIAGNOSIS — M79641 Pain in right hand: Secondary | ICD-10-CM

## 2017-11-17 DIAGNOSIS — M79642 Pain in left hand: Secondary | ICD-10-CM

## 2017-11-18 ENCOUNTER — Ambulatory Visit: Payer: BLUE CROSS/BLUE SHIELD | Attending: Orthopaedic Surgery | Admitting: Occupational Therapy

## 2017-11-18 DIAGNOSIS — R208 Other disturbances of skin sensation: Secondary | ICD-10-CM | POA: Insufficient documentation

## 2017-11-18 DIAGNOSIS — M79642 Pain in left hand: Secondary | ICD-10-CM | POA: Diagnosis not present

## 2017-11-18 DIAGNOSIS — M79641 Pain in right hand: Secondary | ICD-10-CM

## 2017-11-18 DIAGNOSIS — M25641 Stiffness of right hand, not elsewhere classified: Secondary | ICD-10-CM

## 2017-11-18 DIAGNOSIS — M25642 Stiffness of left hand, not elsewhere classified: Secondary | ICD-10-CM | POA: Insufficient documentation

## 2017-11-18 NOTE — Therapy (Signed)
[facility] [address] [place], [place], [address] Phone: [phone]   Fax:  [phone]  Occupational Therapy Treatment  Patient Details  Name: [name] MRN: [id number] Date of Birth: 01/30/1966 Referring Provider: Dr. Joni Fears   Encounter Date: 11/18/2017  OT End of Session - 11/18/17 1543    Visit Number  3    Number of Visits  9    Date for OT Re-Evaluation  12/03/17    Authorization Type  BC/BS - used 3 visits/30 prior to OT evaluation (combined OT, PT, Chiro)    Authorization - Visit Number  6    Authorization - Number of Visits  30    OT Start Time  0258    OT Stop Time  1530    OT Time Calculation (min)  45 min    Activity Tolerance  Patient tolerated treatment well    Behavior During Therapy  [facility] for tasks assessed/performed       Past Medical History:  Diagnosis Date  . Arthritis   . Bursitis   . Carpal tunnel syndrome   . Chronic headaches   . Constipation   . Depression   . Diabetes ([place])    diet controlled, no meds  . Fibromyalgia   . Insomnia   . Snoring   . Tendonitis   . TMJ (dislocation of temporomandibular joint)     Past Surgical History:  Procedure Laterality Date  . ABDOMINAL HYSTERECTOMY    . CESAREAN SECTION     x3  . cyst from hand    . DENTAL SURGERY    . KNEE SURGERY     right  . TEMPOROMANDIBULAR JOINT SURGERY    . TONSILLECTOMY      There were no vitals filed for this visit.  Subjective Assessment - 11/18/17 1459    Subjective   The ultrasound results were normal - no swelling or inflammation causing CTS. 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 PMH: OA bilateral hands, fibromyalgia, DDD lower back pain, TMJ pain, chronic pain syndrome    Patient Stated Goals  Be able to work without being in pain    Currently in Pain?  Yes    Pain Score  3     Pain Location  Hand    Pain Orientation  Right;Left     Pain Descriptors / Indicators  Cramping;Numbness;Sore    Pain Type  Chronic pain    Pain Onset  More than a month ago    Pain Frequency  Intermittent    Aggravating Factors   overuse, pm    Pain Relieving Factors  heat, rest                   OT Treatments/Exercises (OP) - 11/18/17 0001      ADLs   ADL Comments  Discussed task modifications and possible A/E needs for functional tasks, and proper positioning at computer during typing - pt provided handout of proper positioning at work station/computer      Exercises   Exercises  Hand      Hand Exercises   Other Hand Exercises  Reviewed upper and lower median n. gliding ex's - pt performed each x 5 reps. Reviewed/clarified tendon gliding ex's and performed x 5 reps      Modalities   Modalities  Fluidotherapy      RUE Fluidotherapy   Number Minutes Fluidotherapy  12  Minutes    RUE Fluidotherapy Location  Hand;Wrist    Comments  at beginning of session      LUE Fluidotherapy   Number Minutes Fluidotherapy  12 Minutes    LUE Fluidotherapy Location  Hand;Wrist    Comments  simultaneously w/ Rt hand                  OT Long Term Goals - 11/18/17 1544      OT LONG TERM GOAL #1   Title  Independent with HEP (specific to CTS) and putty HEP - 12/03/17    Time  4    Period  Weeks    Status  On-going      OT LONG TERM GOAL #2   Title  Pt to verbalize understanding with pain managment strategies including use of brace, compression glove, modalities    Time  4    Period  Weeks    Status  Achieved      OT LONG TERM GOAL #3   Title  Pt to verbalize understanding with task modifications and/or A/E prn for increase ease with functional tasks    Time  4    Period  Weeks    Status  On-going      OT LONG TERM GOAL #4   Title  Pt to improve grip strength Rt hand to 35 lbs or greater for assisting in opening jars/containers    Baseline  eval = 20 lbs    Time  4    Period  Weeks    Status  New      OT  LONG TERM GOAL #5   Title  Pt to improve lateral and 3 tip pinch each by 3 lbs Rt hand     Baseline  eval: lat = 13, 3 tip = 10    Time  4    Period  Weeks    Status  New            Plan - 11/18/17 1544    Clinical Impression Statement  Pt is progressing towards goals. She demonstates understanding of inital HEP, pain managment, task modifications, and splint wear.    Occupational Profile and client history currently impacting functional performance  PMH: OA bilateral hands, fibromyalgia, DDD lower back. 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    Occupational performance deficits (Please refer to evaluation for details):  ADL's;IADL's;Work;Leisure;Rest and Sleep    Rehab Potential  Good    Current Impairments/barriers affecting progress:  time since onset, comorbidities    OT Frequency  2x / week    OT Duration  4 weeks    OT Treatment/Interventions  Self-care/ADL training;Moist Heat;Fluidtherapy;DME and/or AE instruction;Splinting;Compression bandaging;Therapeutic activities;Therapeutic exercise;Ultrasound;Cryotherapy;Passive range of motion;Electrical Stimulation;Paraffin;Manual Therapy;Patient/family education    Plan  continue fluidotherapy for bilateral hands, if pain minimal begin light strengthening with putty for grip and pinch strength    Consulted and Agree with Plan of Care  Patient       Patient will benefit from skilled therapeutic intervention in order to improve the following deficits and impairments:  Decreased coordination, Decreased range of motion, Increased edema, Impaired sensation, Impaired UE functional use, Pain, Decreased knowledge of use of DME, Decreased strength  Visit Diagnosis: Pain in right hand  Pain in left hand  Other disturbances of skin sensation  Stiffness of right hand, not elsewhere classified  Stiffness of left hand, not elsewhere classified  Problem List Patient  Active Problem List   Diagnosis Date Noted  . Carpal tunnel syndrome, left upper limb 09/16/2017  . Carpal tunnel syndrome, right upper limb 09/16/2017  . History of migraine 08/24/2017  . Diabetes ([place])   . Vitamin D deficiency 01/08/2017  . Elevated LDL cholesterol level 08/10/2016  . Adenomatous polyps 07/13/2016  . Leg cramp 06/25/2015  . Primary osteoarthritis of both hands 06/25/2015  . Hip bursitis 06/25/2015  . 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 Chronic pain syndrome 06/25/2015  . Muscle pain, fibromyalgia 06/25/2015  . Fibromyalgia 06/17/2015    Carey Bullocks, OTR/L 11/18/2017, 3:45 PM  [facility] [address] [place], [place], [address] Phone: [phone]   Fax:  [phone]  Name: Adaleena Mooers MRN: [id number] Date of Birth: 1965-06-06

## 2017-11-25 ENCOUNTER — Encounter: Payer: BLUE CROSS/BLUE SHIELD | Admitting: Occupational Therapy

## 2017-11-26 ENCOUNTER — Encounter: Payer: BLUE CROSS/BLUE SHIELD | Admitting: Occupational Therapy

## 2017-11-30 ENCOUNTER — Encounter: Payer: BLUE CROSS/BLUE SHIELD | Admitting: Occupational Therapy

## 2017-12-01 ENCOUNTER — Encounter: Payer: BLUE CROSS/BLUE SHIELD | Admitting: Occupational Therapy

## 2017-12-05 ENCOUNTER — Other Ambulatory Visit: Payer: Self-pay | Admitting: Family Medicine

## 2017-12-28 ENCOUNTER — Encounter: Payer: Self-pay | Admitting: Rheumatology

## 2017-12-28 NOTE — Progress Notes (Signed)
Office Visit Note  Patient: [name]             Date of Birth: May 01, 1965           MRN: [id number]             PCP: Girtha Rm, NP-C Referring: Girtha Rm, NP-C Visit Date: 12/29/2017 Occupation: @GUAROCC @  Subjective:  Right trochanteric bursitis   History of Present Illness: [name] is a 52 y.o. female with history of osteoarthritis, fibromyalgia, and DDD.  Patient states that she started having right lower back and hip pain on Sunday and it started worsening on Monday.  She states that the only incident that she can remember that may have led to her increased pain was carrying her granddaughter on Sunday.  She states that she has numbness that is radiating down to her right foot.  She is also having pain sleeping on her side at night.  She denies any groin pain at this time.  She states that she is experiences pain in the past.  She continues to have generalized muscle aches and muscle tenderness due to fibromyalgia.  She states her fibromyalgia is flaring currently.  She states her fatigue has been worsening since she has not been sleeping well at night due to her level of pain that she is in.  She continues to have pain in both hands but denies any joint swelling.   Activities of Daily Living:  Patient reports morning stiffness for 10-15 minutes.   Patient Reports nocturnal pain.  Difficulty dressing/grooming: Reports Difficulty climbing stairs: Reports Difficulty getting out of chair: Reports Difficulty using hands for taps, buttons, cutlery, and/or writing: Reports  Review of Systems  Constitutional: Positive for fatigue.  HENT: Negative for mouth sores, trouble swallowing, trouble swallowing, mouth dryness and nose dryness.   Eyes: Negative for pain, visual disturbance and dryness.  Respiratory: Negative for cough, hemoptysis, shortness of breath and difficulty breathing.   Cardiovascular: Negative for chest pain, palpitations, hypertension and swelling  in legs/feet.  Gastrointestinal: Positive for diarrhea and nausea. Negative for abdominal pain, blood in stool, constipation and vomiting.  Endocrine: Negative for cold intolerance, heat intolerance and increased urination.  Genitourinary: Negative for painful urination.  Musculoskeletal: Positive for arthralgias, joint pain and morning stiffness. Negative for joint swelling, myalgias, muscle weakness, muscle tenderness and myalgias.  Skin: Negative for color change, pallor, rash, hair loss, nodules/bumps, skin tightness, ulcers and sensitivity to sunlight.  Allergic/Immunologic: Negative for susceptible to infections.  Neurological: Positive for headaches. Negative for dizziness, light-headedness and weakness.  Hematological: Negative for swollen glands.  Psychiatric/Behavioral: Negative for depressed mood, confusion and sleep disturbance. The patient is not nervous/anxious.     PMFS History:  Patient Active Problem List   Diagnosis Date Noted  . Carpal tunnel syndrome, left upper limb 09/16/2017  . Carpal tunnel syndrome, right upper limb 09/16/2017  . History of migraine 08/24/2017  . Diabetes ([place])   . Vitamin D deficiency 01/08/2017  . Elevated LDL cholesterol level 08/10/2016  . Adenomatous polyps 07/13/2016  . Leg cramp 06/25/2015  . Primary osteoarthritis of both hands 06/25/2015  . Hip bursitis 06/25/2015  . 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 Chronic pain syndrome 06/25/2015  . Muscle pain, fibromyalgia 06/25/2015  . Fibromyalgia 06/17/2015    Past Medical History:  Diagnosis Date  . Arthritis   . Bursitis   . Carpal tunnel syndrome   . Chronic headaches   . Constipation   . Depression   .  Diabetes ([place])    diet controlled, no meds  . Fibromyalgia   . Insomnia   . Snoring   . Tendonitis   . TMJ (dislocation of temporomandibular joint)     Family History  Problem Relation Age of Onset  . Hypertension Mother   . Glaucoma Mother   . Cataracts Mother   . Diabetes  Father   . Hypertension Father   . Glaucoma Brother   . Ataxia Brother   . Fibromyalgia Daughter   . Migraines Daughter   . Migraines Daughter   . Asthma Daughter   . Colon cancer Neg Hx   . Esophageal cancer Neg Hx   . Pancreatic cancer Neg Hx   . Rectal cancer Neg Hx   . Stomach cancer Neg Hx    Past Surgical History:  Procedure Laterality Date  . ABDOMINAL HYSTERECTOMY    . CESAREAN SECTION     x3  . cyst from hand    . DENTAL SURGERY    . KNEE SURGERY     right  . TEMPOROMANDIBULAR JOINT SURGERY    . TONSILLECTOMY     Social History   Social History Narrative  . Not on file    Objective: Vital Signs: BP 117/77 (BP Location: Left Arm, Patient Position: Sitting, Cuff Size: Normal)   Pulse 95   Resp 13   Ht 5' 3.5" (1.613 m)   Wt 190 lb (86.2 kg)   BMI 33.13 kg/m    Physical Exam  Constitutional: She is oriented to person, place, and time. She appears well-developed and well-nourished.  HENT:  Head: Normocephalic and atraumatic.  Eyes: Conjunctivae and EOM are normal.  Neck: Normal range of motion.  Cardiovascular: Normal rate, regular rhythm, normal heart sounds and intact distal pulses.  Pulmonary/Chest: Effort normal and breath sounds normal.  Abdominal: Soft. Bowel sounds are normal.  Lymphadenopathy:    She has no cervical adenopathy.  Neurological: She is alert and oriented to person, place, and time.  Skin: Skin is warm and dry. Capillary refill takes less than 2 seconds.  Psychiatric: She has a normal mood and affect. Her behavior is normal.  Nursing note and vitals reviewed.    Musculoskeletal Exam: Generalized hyperalgesia. C-spine, thoracic spine, lumbar spine good range of motion.  No midline spinal tenderness.  Right SI joint tenderness. Shoulder joints and elbow joints and wrist joints, MCPs, PIPs, DIPs good range of motion with no synovitis.  She has complete fist formation bilaterally.  Hip joints, knee joints and ankle joints, MTPs, PIPs,  DIPs good range of motion with no synovitis.  No warmth or effusion of bilateral knee joints.  She has bilateral knee crepitus.  No swelling or warmth of ankle joints.  She has tenderness of right trochanteric bursa on exam.  No groin pain with hip range of motion.  CDAI Exam: CDAI Score: Not documented Patient Global Assessment: Not documented; Provider Global Assessment: Not documented Swollen: Not documented; Tender: Not documented Joint Exam   Not documented   There is currently no information documented on the homunculus. Go to the Rheumatology activity and complete the homunculus joint exam.  Investigation: No additional findings.  Imaging: No results found.  Recent Labs: Lab Results  Component Value Date   WBC 6.7 05/21/2017   HGB 12.9 05/21/2017   PLT 260 05/21/2017   NA 143 05/21/2017   K 3.8 05/21/2017   CL 103 05/21/2017   CO2 26 05/21/2017   GLUCOSE 103 (H) 05/21/2017  BUN 10 05/21/2017   CREATININE 0.96 05/21/2017   BILITOT 0.4 05/21/2017   ALKPHOS 120 (H) 05/21/2017   AST 15 05/21/2017   ALT 12 05/21/2017   PROT 7.3 05/21/2017   ALBUMIN 4.5 05/21/2017   CALCIUM 9.9 05/21/2017   GFRAA 78 05/21/2017    Speciality Comments: No specialty comments available.  Procedures:  Large Joint Inj: R greater trochanter on 12/29/2017 10:27 AM Indications: pain Details: 27 G 1.5 in needle, lateral approach  Arthrogram: No  Medications: 1 mL lidocaine 1 %; 40 mg triamcinolone acetonide 40 MG/ML Aspirate: 0 mL Outcome: tolerated well, no immediate complications Procedure, treatment alternatives, risks and benefits explained, specific risks discussed. Consent was given by the patient. Immediately prior to procedure a time out was called to verify the correct patient, procedure, equipment, support staff and site/side marked as required. Patient was prepped and draped in the usual sterile fashion.   Sacroiliac Joint Inj on 12/29/2017 10:27 AM Indications: pain Details:  27 G 1.5 in needle, posterior approach Medications: 1 mL lidocaine 1 %; 40 mg triamcinolone acetonide 40 MG/ML Aspirate: 0 mL Outcome: tolerated well, no immediate complications Procedure, treatment alternatives, risks and benefits explained, specific risks discussed. Consent was given by the patient. Immediately prior to procedure a time out was called to verify the correct patient, procedure, equipment, support staff and site/side marked as required. Patient was prepped and draped in the usual sterile fashion.     Allergies: Amoxicillin; Doxycycline; and Penicillins   Assessment / Plan:     Visit Diagnoses: Primary osteoarthritis of both hands: She has chronic pain in bilateral hands but no synovitis was noted.  Joint protection and muscle strengthening were discussed.  We also discussed natural anti-inflammatories that she can take.  She is currently taking turmeric by mouth daily but is going to introduce the other natural anti-inflammatories that we discussed.  DDD (degenerative disc disease), lumbar: She has no midline spinal tenderness on exam today.  She has good range of motion.  Fibromyalgia: She is been having a fibromyalgia flare for the past 3 days.  She has generalized hyperalgesia on exam.  She is been having generalized muscle aches and muscle tenderness.  She has right trochanteric bursitis and right SI joint tenderness on exam today.  She takes Cymbalta 30 mg by mouth twice daily as well as Zanaflex 4 mg twice daily as needed for muscle spasms.  She is encouraged to try to stay active and exercise on a regular basis.  She was given a handout of exercises that she can perform at home.  Trochanteric bursitis of right hip: She has tenderness the right trochanteric bursa on exam.  She has been having increased pain for the past 3 days.  She is having pain when she is sleeping on her side at night.  She requested a cortisone injection today in the office.  She tolerated the procedure  well.  She was advised to monitor her blood pressure closely upon the cortisone injection.  She was also advised to limit sodium and sugar intake.  She was given a handout of exercises that she can perform at home.  Chronic right SI joint pain: She has right SI joint tenderness on exam today.  She requested a cortisone injection today in the office.  She tolerated it well.  Potential side effects were discussed.  She was advised to monitor her blood pressure closely upon the cortisone injection today.  She was given a handout of exercises that she can  perform at home.  She is advised to notify us if she develops new or worsening symptoms.  Chronic pain syndrome: She takes Cymbalta 30 mg po twice daily.    Other fatigue: She has chronic fatigue.  She is currently having a fibromyalgia flare so her fatigue has been worsening over the past 3 days.   Other insomnia: She has chronic insomnia.  She takes melatonin at bedtime.   Bilateral carpal tunnel syndrome - Right-She is seeing Dr. Durward Fortes  Other medical conditions are listed as follows:   Adenomatous polyps  History of diabetes mellitus, type II  Vitamin D deficiency  History of depression  History of migraine   Orders: Orders Placed This Encounter  Procedures  . Large Joint Inj  . Sacroiliac Joint Inj   No orders of the defined types were placed in this encounter.   Face-to-face time spent with patient was 30 minutes. Greater than 50% of time was spent in counseling and coordination of care.  Follow-Up Instructions: Return in about 6 months (around 06/29/2018) for Osteoarthritis, Fibromyalgia, DDD.   Ofilia Neas, PA-C  Note - This record has been created using Dragon software.  Chart creation errors have been sought, but may not always  have been located. Such creation errors do not reflect on  the standard of medical care.

## 2017-12-28 NOTE — Telephone Encounter (Signed)
Contacted patient and scheduled an appointment for patient to be evaluated on 12/29/17 at 9:20 am.

## 2017-12-29 ENCOUNTER — Ambulatory Visit: Payer: BLUE CROSS/BLUE SHIELD | Admitting: Physician Assistant

## 2017-12-29 ENCOUNTER — Encounter: Payer: Self-pay | Admitting: Physician Assistant

## 2017-12-29 VITALS — BP 117/77 | HR 95 | Resp 13 | Ht 63.5 in | Wt 190.0 lb

## 2017-12-29 DIAGNOSIS — Z8669 Personal history of other diseases of the nervous system and sense organs: Secondary | ICD-10-CM

## 2017-12-29 DIAGNOSIS — M5136 Other intervertebral disc degeneration, lumbar region: Secondary | ICD-10-CM | POA: Diagnosis not present

## 2017-12-29 DIAGNOSIS — M533 Sacrococcygeal disorders, not elsewhere classified: Secondary | ICD-10-CM

## 2017-12-29 DIAGNOSIS — R5383 Other fatigue: Secondary | ICD-10-CM

## 2017-12-29 DIAGNOSIS — M7061 Trochanteric bursitis, right hip: Secondary | ICD-10-CM

## 2017-12-29 DIAGNOSIS — M19041 Primary osteoarthritis, right hand: Secondary | ICD-10-CM | POA: Diagnosis not present

## 2017-12-29 DIAGNOSIS — G894 Chronic pain syndrome: Secondary | ICD-10-CM

## 2017-12-29 DIAGNOSIS — E559 Vitamin D deficiency, unspecified: Secondary | ICD-10-CM

## 2017-12-29 DIAGNOSIS — Z8659 Personal history of other mental and behavioral disorders: Secondary | ICD-10-CM

## 2017-12-29 DIAGNOSIS — M797 Fibromyalgia: Secondary | ICD-10-CM

## 2017-12-29 DIAGNOSIS — G5603 Carpal tunnel syndrome, bilateral upper limbs: Secondary | ICD-10-CM

## 2017-12-29 DIAGNOSIS — G8929 Other chronic pain: Secondary | ICD-10-CM

## 2017-12-29 DIAGNOSIS — G4709 Other insomnia: Secondary | ICD-10-CM

## 2017-12-29 DIAGNOSIS — Z8639 Personal history of other endocrine, nutritional and metabolic disease: Secondary | ICD-10-CM

## 2017-12-29 DIAGNOSIS — D369 Benign neoplasm, unspecified site: Secondary | ICD-10-CM

## 2017-12-29 DIAGNOSIS — M19042 Primary osteoarthritis, left hand: Secondary | ICD-10-CM

## 2017-12-29 MED ORDER — TRIAMCINOLONE ACETONIDE 40 MG/ML IJ SUSP
40.0000 mg | INTRAMUSCULAR | Status: AC | PRN
  Administered 2017-12-29: 40 mg via INTRA_ARTICULAR

## 2017-12-29 MED ORDER — LIDOCAINE HCL 1 % IJ SOLN
1.0000 mL | INTRAMUSCULAR | Status: AC | PRN
  Administered 2017-12-29: 1 mL

## 2017-12-29 NOTE — Patient Instructions (Signed)
Sacroiliac Joint Dysfunction Sacroiliac joint dysfunction is a condition that causes inflammation on one or both sides of the sacroiliac (SI) joint. The SI joint connects the lower part of the spine (sacrum) with the two upper portions of the pelvis (ilium). This condition causes deep aching or burning pain in the low back. In some cases, the pain may also spread into one or both buttocks or hips or spread down the legs. What are the causes? This condition may be caused by:  Pregnancy. During pregnancy, extra stress is put on the SI joints because the pelvis widens.  Injury, such as: ? Car accidents. ? Sport-related injuries. ? Work-related injuries.  Having one leg that is shorter than the other.  Conditions that affect the joints, such as: ? Rheumatoid arthritis. ? Gout. ? Psoriatic arthritis. ? Joint infection (septic arthritis).  Sometimes, the cause of SI joint dysfunction is not known. What are the signs or symptoms? Symptoms of this condition include:  Aching or burning pain in the lower back. The pain may also spread to other areas, such as: ? Buttocks. ? Groin. ? Thighs and legs.  Muscle spasms in or around the painful areas.  Increased pain when standing, walking, running, stair climbing, bending, or lifting.  How is this diagnosed? Your health care provider will do a physical exam and take your medical history. During the exam, the health care provider may move one or both of your legs to different positions to check for pain. Various tests may be done to help verify the diagnosis, including:  Imaging tests to look for other causes of pain. These may include: ? MRI. ? CT scan. ? Bone scan.  Diagnostic injection. A numbing medicine is injected into the SI joint using a needle. If the pain is temporarily improved or stopped after the injection, this can indicate that SI joint dysfunction is the problem.  How is this treated? Treatment may vary depending on the  cause and severity of your condition. Treatment options may include:  Applying ice or heat to the lower back area. This can help to reduce pain and muscle spasms.  Medicines to relieve pain or inflammation or to relax the muscles.  Wearing a back brace (sacroiliac brace) to help support the joint while your back is healing.  Physical therapy to increase muscle strength around the joint and flexibility at the joint. This may also involve learning proper body positions and ways of moving to relieve stress on the joint.  Direct manipulation of the SI joint.  Injections of steroid medicine into the joint in order to reduce pain and swelling.  Radiofrequency ablation to burn away nerves that are carrying pain messages from the joint.  Use of a device that provides electrical stimulation in order to reduce pain at the joint.  Surgery to put in screws and plates that limit or prevent joint motion. This is rare.  Follow these instructions at home:  Rest as needed. Limit your activities as directed by your health care provider.  Take medicines only as directed by your health care provider.  If directed, apply ice to the affected area: ? Put ice in a plastic bag. ? Place a towel between your skin and the bag. ? Leave the ice on for 20 minutes, 2-3 times per day.  Use a heating pad or a moist heat pack as directed by your health care provider.  Exercise as directed by your health care provider or physical therapist.  Keep all follow-up visits   as directed by your health care provider. This is important. Contact a health care provider if:  Your pain is not controlled with medicine.  You have a fever.  You have increasingly severe pain. Get help right away if:  You have weakness, numbness, or tingling in your legs or feet.  You lose control of your bladder or bowel. This information is not intended to replace advice given to you by your health care provider. Make sure you discuss  any questions you have with your health care provider. Document Released: 07/03/2008 Document Revised: 09/12/2015 Document Reviewed: 12/12/2013 Elsevier Interactive Patient Education  2018 [place]. Trochanteric Bursitis Rehab Ask your health care provider which exercises are safe for you. Do exercises exactly as told by your health care provider and adjust them as directed. It is normal to feel mild stretching, pulling, tightness, or discomfort as you do these exercises, but you should stop right away if you feel sudden pain or your pain gets worse.Do not begin these exercises until told by your health care provider. Stretching exercises These exercises warm up your muscles and joints and improve the movement and flexibility of your hip. These exercises also help to relieve pain and stiffness. Exercise A: Iliotibial band stretch  1. Lie on your side with your left / right leg in the top position. 2. Bend your left / right knee and grab your ankle. 3. Slowly bring your knee back so your thigh is behind your body. 4. Slowly lower your knee toward the floor until you feel a gentle stretch on the outside of your left / right thigh. If you do not feel a stretch and your knee will not fall farther, place the heel of your other foot on top of your outer knee and pull your thigh down farther. 5. Hold this position for __________ seconds. 6. Slowly return to the starting position. Repeat __________ times. Complete this exercise __________ times a day. Strengthening exercises These exercises build strength and endurance in your hip and pelvis. Endurance is the ability to use your muscles for a long time, even after they get tired. Exercise B: Bridge ( hip extensors) 1. Lie on your back on a firm surface with your knees bent and your feet flat on the floor. 2. Tighten your buttocks muscles and lift your buttocks off the floor until your trunk is level with your thighs. You should feel the muscles  working in your buttocks and the back of your thighs. If this exercise is too easy, try doing it with your arms crossed over your chest. 3. Hold this position for __________ seconds. 4. Slowly return to the starting position. 5. Let your muscles relax completely between repetitions. Repeat __________ times. Complete this exercise __________ times a day. Exercise C: Squats ( knee extensors and  quadriceps) 1. Stand in front of a table, with your feet and knees pointing straight ahead. You may rest your hands on the table for balance but not for support. 2. Slowly bend your knees and lower your hips like you are going to sit in a chair. ? Keep your weight over your heels, not over your toes. ? Keep your lower legs upright so they are parallel with the table legs. ? Do not let your hips go lower than your knees. ? Do not bend lower than told by your health care provider. ? If your hip pain increases, do not bend as low. 3. Hold this position for __________ seconds. 4. Slowly push with your  legs to return to standing. Do not use your hands to pull yourself to standing. Repeat __________ times. Complete this exercise __________ times a day. Exercise D: Hip hike 1. Stand sideways on a bottom step. Stand on your left / right leg with your other foot unsupported next to the step. You can hold onto the railing or wall if needed for balance. 2. Keeping your knees straight and your torso square, lift your left / right hip up toward the ceiling. 3. Hold this position for __________ seconds. 4. Slowly let your left / right hip lower toward the floor, past the starting position. Your foot should get closer to the floor. Do not lean or bend your knees. Repeat __________ times. Complete this exercise __________ times a day. Exercise E: Single leg stand 1. Stand near a counter or door frame that you can hold onto for balance as needed. It is helpful to stand in front of a mirror for this exercise so you can  watch your hip. 2. Squeeze your left / right buttock muscles then lift up your other foot. Do not let your left / right hip push out to the side. 3. Hold this position for __________ seconds. Repeat __________ times. Complete this exercise __________ times a day. This information is not intended to replace advice given to you by your health care provider. Make sure you discuss any questions you have with your health care provider. Document Released: 05/14/2004 Document Revised: 12/12/2015 Document Reviewed: 03/22/2015 Elsevier Interactive Patient Education  [employer].

## 2018-01-24 ENCOUNTER — Other Ambulatory Visit: Payer: Self-pay | Admitting: Family Medicine

## 2018-01-24 DIAGNOSIS — E119 Type 2 diabetes mellitus without complications: Secondary | ICD-10-CM

## 2018-02-12 ENCOUNTER — Other Ambulatory Visit: Payer: Self-pay | Admitting: Family Medicine

## 2018-02-21 ENCOUNTER — Other Ambulatory Visit: Payer: Self-pay

## 2018-02-21 MED ORDER — DULOXETINE HCL 30 MG PO CPEP: 30.0000 mg | ORAL_CAPSULE | Freq: Two times a day (BID) | ORAL | 2 refills | Status: DC

## 2018-02-21 NOTE — Telephone Encounter (Signed)
Pharmacy sent fax requesting a refill on Cymbalta. Please advise

## 2018-03-07 ENCOUNTER — Telehealth: Payer: Self-pay

## 2018-03-07 MED ORDER — DULOXETINE HCL 30 MG PO CPEP: 30.0000 mg | ORAL_CAPSULE | Freq: Two times a day (BID) | ORAL | 0 refills | Status: DC

## 2018-03-07 NOTE — Telephone Encounter (Signed)
[facility] has sent rejection letter stating patient should be taking Duloxetine 1x daily. Please advise.

## 2018-03-07 NOTE — Telephone Encounter (Signed)
Please clarify with patient and she may need to ask her rheumatologist how often they want her to take this. She recently saw them

## 2018-03-07 NOTE — Telephone Encounter (Signed)
Pt states that she is taking 30mg  twice a day = 60mg  daily. Her rheumatologist states that she may need to up med cause its not working for her. She will discuss this 12/20 when she is seen for CPE. I refilled med 30mg  for twice a day for 30 days.

## 2018-03-13 ENCOUNTER — Encounter: Payer: Self-pay | Admitting: Family Medicine

## 2018-03-14 MED ORDER — DULOXETINE HCL 60 MG PO CPEP: 60.0000 mg | ORAL_CAPSULE | Freq: Every day | ORAL | 3 refills | Status: DC

## 2018-03-31 ENCOUNTER — Ambulatory Visit: Payer: BLUE CROSS/BLUE SHIELD | Admitting: Rheumatology

## 2018-03-31 ENCOUNTER — Other Ambulatory Visit: Payer: Self-pay | Admitting: Internal Medicine

## 2018-03-31 DIAGNOSIS — E119 Type 2 diabetes mellitus without complications: Secondary | ICD-10-CM

## 2018-03-31 MED ORDER — SEMAGLUTIDE(0.25 OR 0.5MG/DOS) 2 MG/1.5ML ~~LOC~~ SOPN: 0.5000 mg | PEN_INJECTOR | SUBCUTANEOUS | 0 refills | Status: DC

## 2018-04-08 ENCOUNTER — Ambulatory Visit: Payer: 59 | Admitting: Family Medicine

## 2018-04-08 ENCOUNTER — Encounter: Payer: Self-pay | Admitting: Family Medicine

## 2018-04-08 VITALS — BP 120/70 | HR 60 | Wt 190.8 lb

## 2018-04-08 DIAGNOSIS — F411 Generalized anxiety disorder: Secondary | ICD-10-CM

## 2018-04-08 DIAGNOSIS — E119 Type 2 diabetes mellitus without complications: Secondary | ICD-10-CM

## 2018-04-08 HISTORY — DX: Generalized anxiety disorder: F41.1

## 2018-04-08 LAB — POCT GLYCOSYLATED HEMOGLOBIN (HGB A1C): Hemoglobin A1C: 6 % — AB (ref 4.0–5.6)

## 2018-04-08 MED ORDER — LISINOPRIL 2.5 MG PO TABS: 2.5000 mg | ORAL_TABLET | Freq: Every day | ORAL | 5 refills | Status: DC

## 2018-04-08 MED ORDER — DULOXETINE HCL 30 MG PO CPEP: 30.0000 mg | ORAL_CAPSULE | Freq: Two times a day (BID) | ORAL | 5 refills | Status: DC

## 2018-04-08 NOTE — Progress Notes (Signed)
Subjective:    Patient ID: [name], female    DOB: [birth year], 52 y.o.   MRN: [id number]  Chief Complaint  Patient presents with  . DM    DM check, anxiety issues    [name] is a 52 y.o. female who presents for follow-up of Type 2 diabetes mellitus. She would also like to discuss anxiety.   Statin compliance is good and no side effects.    States she is having increased anxiety being in crowds and when having meetings.  She used to take 30 mg cymbalta bid and did well on this. Her insurance changed and she started taking 60 mg once daily and her anxiety has worsened.    Patient is checking home blood sugars.   Home blood sugar records: BGs range between 112 and 115 How often is blood sugars being checked: once a day Current symptoms include: none. Patient denies increased appetite, nausea, visual disturbances, vomiting and weight loss.  Patient is checking their feet daily. Any Foot concerns (callous, ulcer, wound, thickened nails, toenail fungus, skin fungus, hammer toe): none Last dilated eye exam: janaury 20th- [facility]- [place]   Current treatments: doing well on DM medication Ozempic.  Medication compliance: good  Current diet: in general, a "healthy" diet   Current exercise: walking Known diabetic complications: none  The following portions of the patient's history were reviewed and updated as appropriate: allergies, current medications, past medical history, past social history and problem list.  ROS as in subjective above.     Objective:    Physical Exam Alert and oriented and in no acute distress. Foot exam shows dry skin but otherwise normal.   Blood pressure 120/70, pulse 60, weight 190 lb 12.8 oz (86.5 kg).  Lab Review Diabetic Labs Latest Ref Rng & Units 10/07/2017 05/21/2017 12/07/2016 09/28/2016 08/10/2016  HbA1c 4.0 - 5.6 % 6.1(A) 6.0% 6.8% - 6.7%  Microalbumin Not estab mg/dL - - - - 0.3  Micro/Creat Ratio <30 mcg/mg creat - - - - 2  Chol 100 - 199  mg/dL - 162 - 158 -  HDL >39 mg/dL - 58 - 58 -  Calc LDL 0 - 99 mg/dL - 89 - 88 -  Triglycerides 0 - 149 mg/dL - 74 - 62 -  Creatinine 0.57 - 1.00 mg/dL - 0.96 - - -   BP/Weight 04/08/2018 12/29/2017 10/07/2017 09/28/2017 6/72/0947  Systolic BP 096 283 662 947 654  Diastolic BP 70 77 80 81 85  Wt. (Lbs) 190.8 190 187 188 188  BMI 33.27 33.13 32.61 32.78 32.78   Foot/eye exam completion dates Latest Ref Rng & Units 04/08/2018 12/11/2016  Eye Exam No Retinopathy - No Retinopathy  Foot Form Completion - Done -    [name]  reports that she has never smoked. She has never used smokeless tobacco. She reports previous drug use. She reports that she does not drink alcohol.     Assessment & Plan:    Type 2 diabetes mellitus without complication, without long-term current use of insulin (HCC) - Plan: CBC with Differential/Platelet, Comprehensive metabolic panel, TSH, lisinopril (ZESTRIL) 2.5 MG tablet  GAD (generalized anxiety disorder) - Plan: TSH  1. Rx changes: none Hgb A1c 6.0%  2. Education: Reviewed 'ABCs' of diabetes management (respective goals in parentheses):  A1C (<7), blood pressure (<130/80), and cholesterol (LDL <100). 3. Compliance at present is estimated to be good. Efforts to improve compliance (if necessary) will be directed at dietary modifications: continue eating low carbohyrate diet, increased exercise  and regular blood sugar monitoring: weekly. 4. Continue on daily statin.  5. Eye exam scheduled for January.  6. Anxiety- will change back to cymbalta 30 mg bid since she has done well with this in the past.  7. Follow up: 6 months for fasting CPE and diabetes.

## 2018-04-08 NOTE — Addendum Note (Signed)
Addended by: Minette Headland A on: 04/08/2018 10:09 AM   Modules accepted: Orders

## 2018-04-09 LAB — CBC WITH DIFFERENTIAL/PLATELET
Basophils Absolute: 0 10*3/uL (ref 0.0–0.2)
Basos: 0 %
EOS (ABSOLUTE): 0.1 10*3/uL (ref 0.0–0.4)
Eos: 1 %
Hematocrit: 40.4 % (ref 34.0–46.6)
Hemoglobin: 13.1 g/dL (ref 11.1–15.9)
Immature Grans (Abs): 0 10*3/uL (ref 0.0–0.1)
Immature Granulocytes: 0 %
Lymphocytes Absolute: 1.9 10*3/uL (ref 0.7–3.1)
Lymphs: 28 %
MCH: 28.2 pg (ref 26.6–33.0)
MCHC: 32.4 g/dL (ref 31.5–35.7)
MCV: 87 fL (ref 79–97)
Monocytes Absolute: 0.4 10*3/uL (ref 0.1–0.9)
Monocytes: 6 %
Neutrophils Absolute: 4.3 10*3/uL (ref 1.4–7.0)
Neutrophils: 65 %
Platelets: 246 10*3/uL (ref 150–450)
RBC: 4.64 x10E6/uL (ref 3.77–5.28)
RDW: 13.4 % (ref 12.3–15.4)
WBC: 6.6 10*3/uL (ref 3.4–10.8)

## 2018-04-09 LAB — COMPREHENSIVE METABOLIC PANEL
ALT: 23 IU/L (ref 0–32)
AST: 15 IU/L (ref 0–40)
Albumin/Globulin Ratio: 1.9 (ref 1.2–2.2)
Albumin: 4.3 g/dL (ref 3.5–5.5)
Alkaline Phosphatase: 108 IU/L (ref 39–117)
BUN/Creatinine Ratio: 19 (ref 9–23)
BUN: 15 mg/dL (ref 6–24)
Bilirubin Total: 0.3 mg/dL (ref 0.0–1.2)
CO2: 23 mmol/L (ref 20–29)
Calcium: 9.3 mg/dL (ref 8.7–10.2)
Chloride: 105 mmol/L (ref 96–106)
Creatinine, Ser: 0.81 mg/dL (ref 0.57–1.00)
GFR calc Af Amer: 95 mL/min/{1.73_m2} (ref >59)
GFR calc non Af Amer: 83 mL/min/{1.73_m2} (ref >59)
Globulin, Total: 2.3 g/dL (ref 1.5–4.5)
Glucose: 99 mg/dL (ref 65–99)
Potassium: 4.2 mmol/L (ref 3.5–5.2)
Sodium: 143 mmol/L (ref 134–144)
Total Protein: 6.6 g/dL (ref 6.0–8.5)

## 2018-04-09 LAB — TSH: TSH: 0.879 u[IU]/mL (ref 0.450–4.500)

## 2018-04-18 ENCOUNTER — Encounter: Payer: Self-pay | Admitting: Family Medicine

## 2018-04-21 ENCOUNTER — Telehealth: Payer: Self-pay | Admitting: Medical

## 2018-04-21 NOTE — Telephone Encounter (Signed)
Spoke to pt and pt states she has had a constant headache since Dec 26th. She started new bp med on christmas eve. She now has a nagging headache

## 2018-04-21 NOTE — Telephone Encounter (Signed)
Left message for pt to call me back 

## 2018-04-21 NOTE — Telephone Encounter (Signed)
Pt was notified.  

## 2018-04-21 NOTE — Telephone Encounter (Signed)
Please call patient.  I got the message late when it came in and apologize for not responding to it sooner.    Cal and see how she is feeling now, and if still having problems, let Vickie know.   Keep in mind that its probably NOT the medication causing the symptoms, particularly at that low dose.  However if BP is high, she may not feel well.

## 2018-04-21 NOTE — Telephone Encounter (Signed)
She can stop the lisinopril and see if her headache goes away. She may need to be seen if it does not after a few days or if her headache gets worse.

## 2018-05-17 IMAGING — CT CT HEAD W/O CM
3 of 4 series · 15 of 47 positions shown, 18 images · non-contrast
Comparison: None.

CLINICAL DATA: Headache history of fall

EXAM:
CT HEAD WITHOUT CONTRAST
TECHNIQUE: Contiguous axial images were obtained from the base of the skull
through the vertex without intravenous contrast.

[Series 2: head w/o · axial · non-contrast · 0.40mm/px · z∈[-99,+21]mm · 9 of 30 slices shown, 12 images]
[im 3/30  brain]
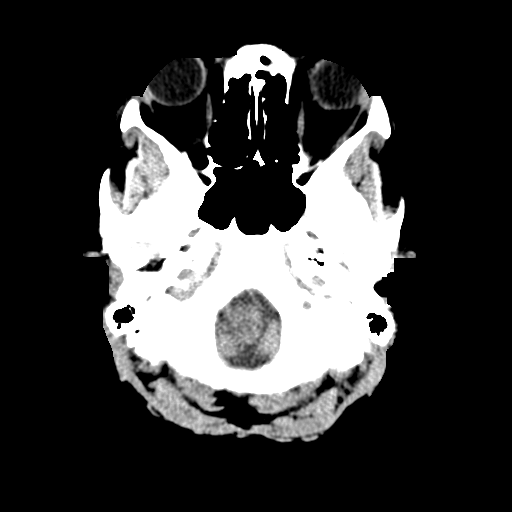
[im 3/30  bone]
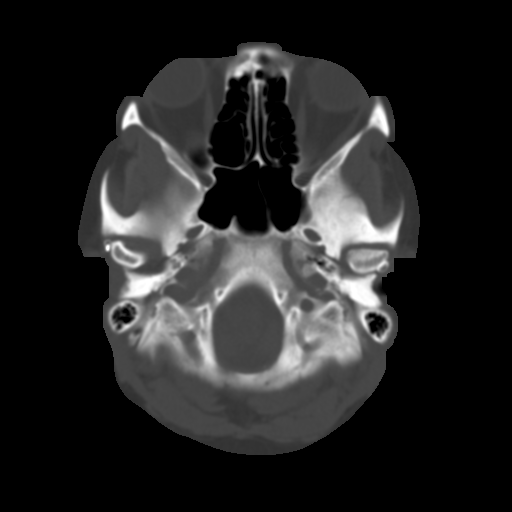
[im 6/30  brain]
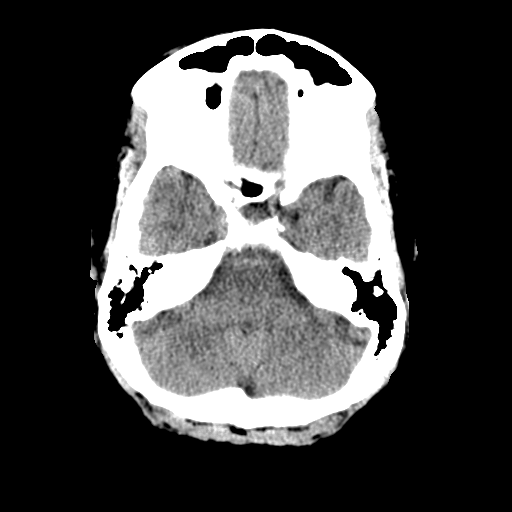
[im 9/30  brain]
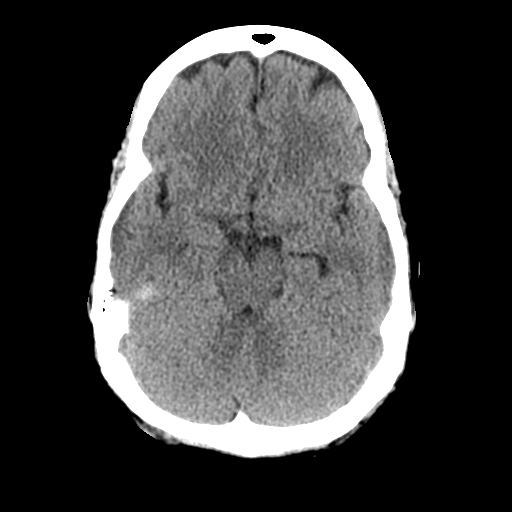
[im 12/30  brain]
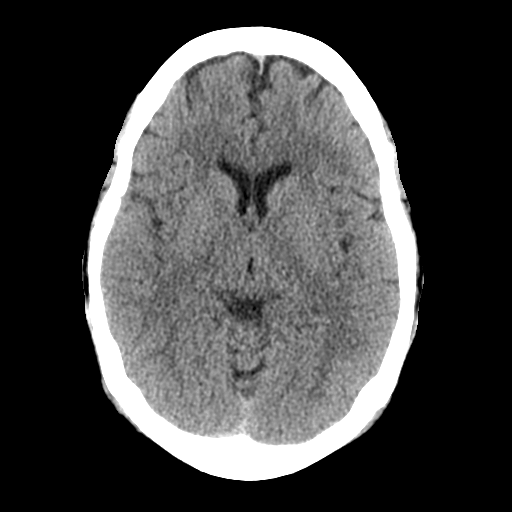
[im 15/30  brain]
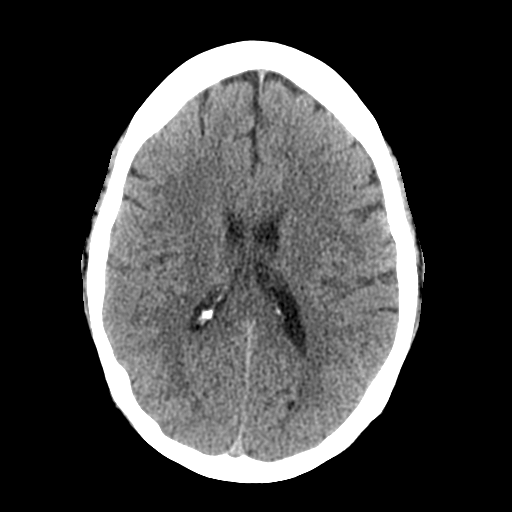
[im 15/30  bone]
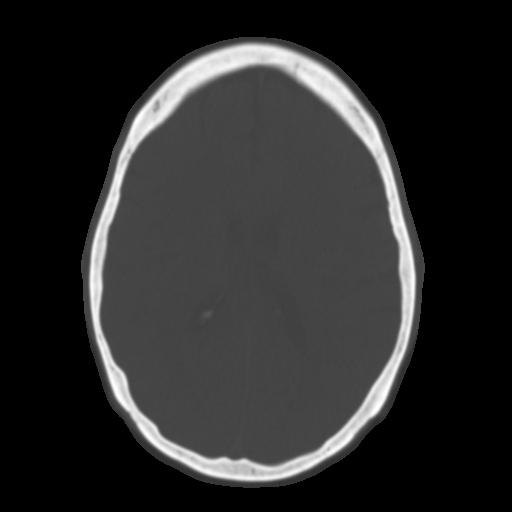
[im 18/30  brain]
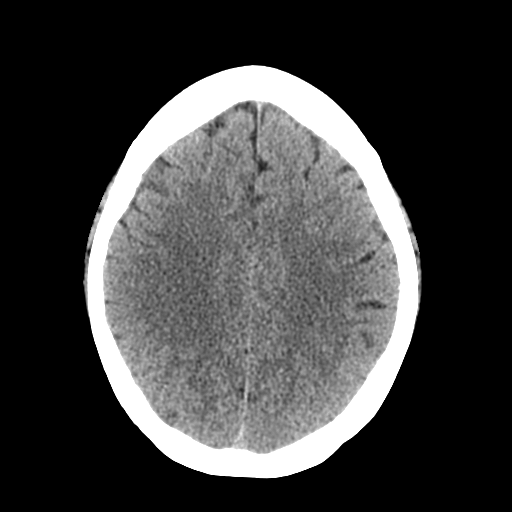
[im 21/30  brain]
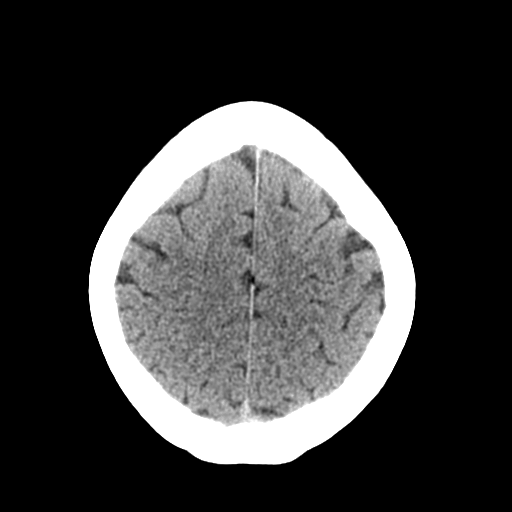
[im 24/30  brain]
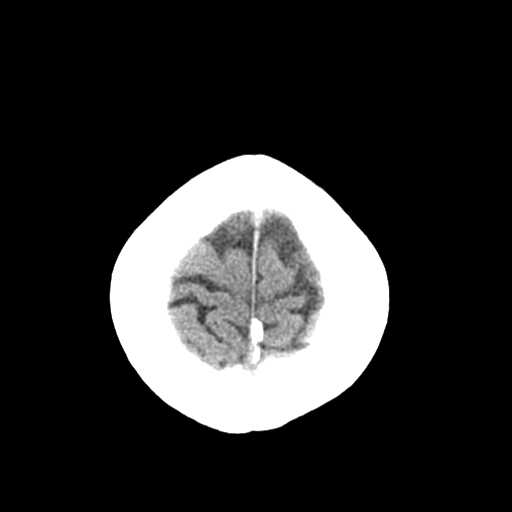
[im 27/30  brain]
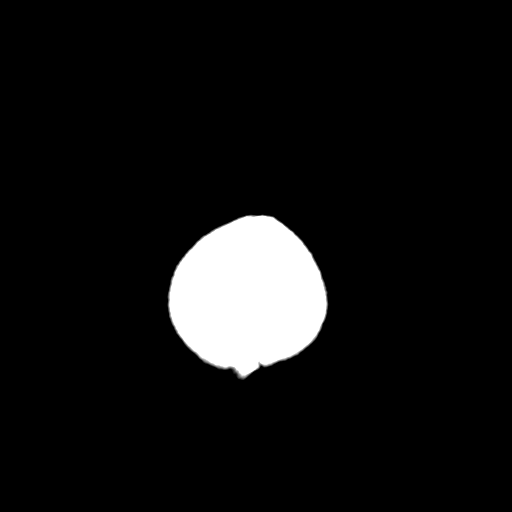
[im 27/30  bone]
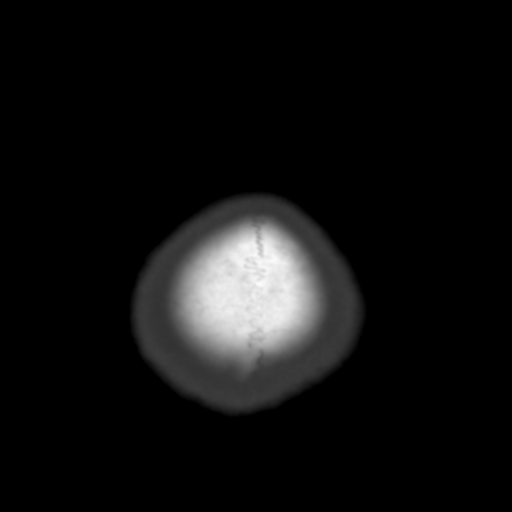

[Series 5: coronal · coronal · 0.26mm/px · 3 of 69 slices shown]
[im 23/69  brain]
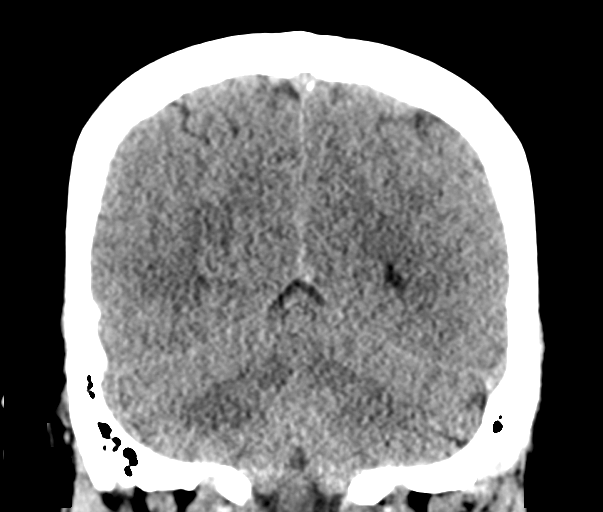
[im 31/69  brain]
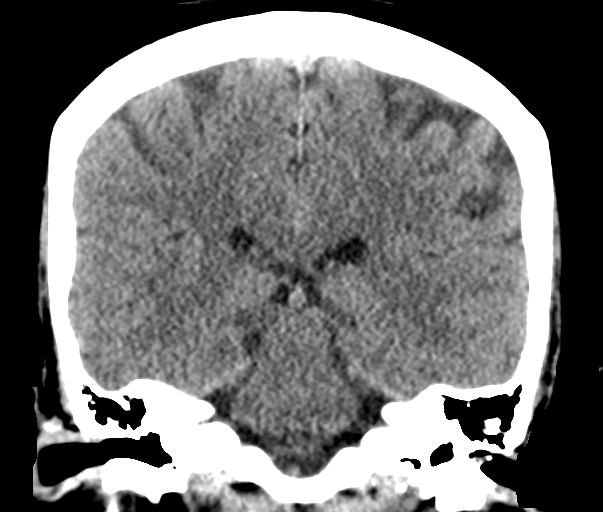
[im 38/69  brain]
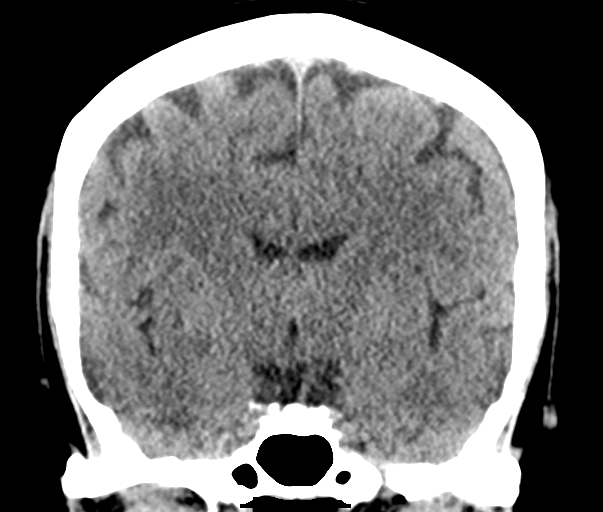

[Series 6: sagittal · sagittal · 0.26mm/px · 3 of 54 slices shown]
[im 18/54  brain]
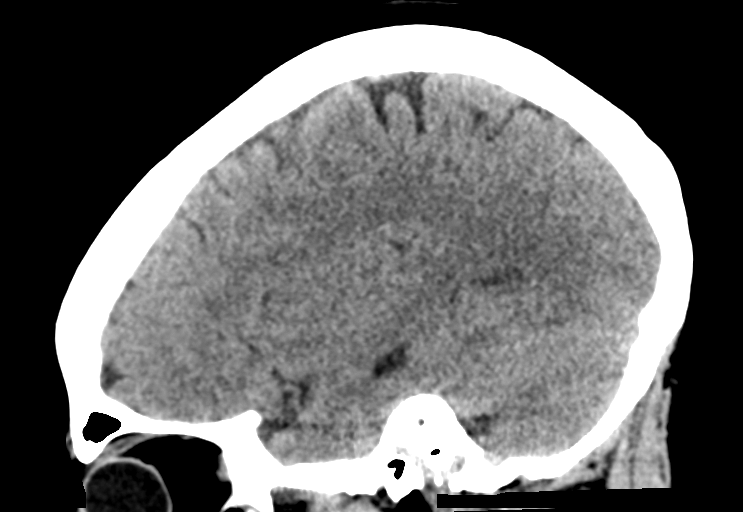
[im 27/54  brain]
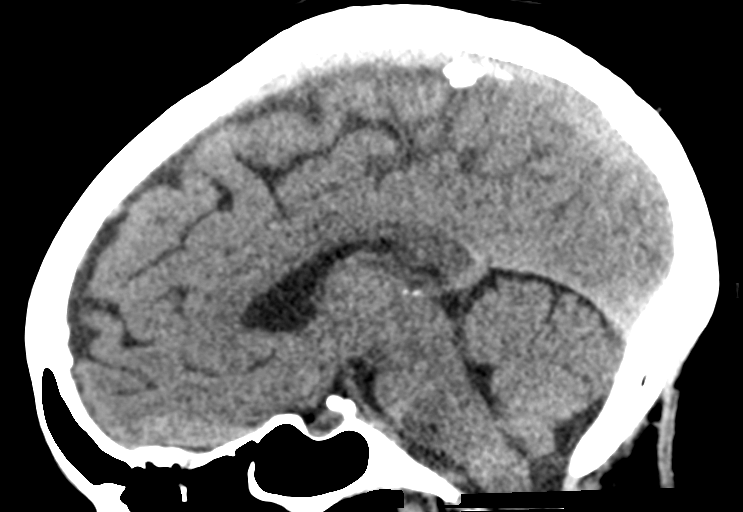
[im 36/54  brain]
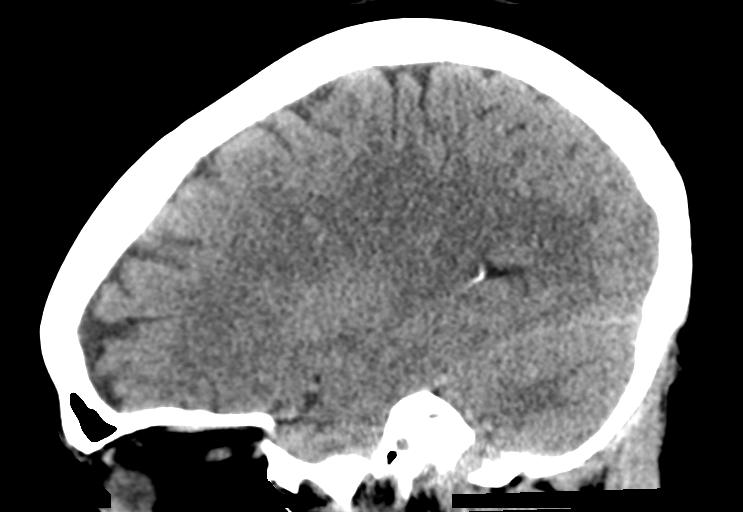

[15 of 47 positions shown; findings below may reference images not displayed]

FINDINGS: Brain: No evidence of acute infarction, hemorrhage, hydrocephalus,
extra-axial collection or mass lesion/mass effect.

Vascular: No hyperdense vessel or unexpected calcification.

Skull: Normal. Negative for fracture or focal lesion. Small metallic
densities adjacent to the mandibular heads.

Sinuses/Orbits: No acute finding.

Other: None
IMPRESSION: No CT evidence for acute intracranial abnormality

## 2018-05-23 ENCOUNTER — Other Ambulatory Visit: Payer: Self-pay | Admitting: Family Medicine

## 2018-05-23 DIAGNOSIS — E119 Type 2 diabetes mellitus without complications: Secondary | ICD-10-CM

## 2018-05-25 NOTE — Therapy (Signed)
[facility] [address] [place], [place], [address] Phone: [phone]   Fax:  [phone]  Patient Details  Name: [name] MRN: [id number] Date of Birth: 1966-02-14 Referring Provider:  No ref. provider found  Encounter Date: 05/25/2018  Pt did not return after 3rd visit on 11/18/17, therefore will d/c episode of care at this time.   Carey Bullocks, OTR/L 05/25/2018, 10:53 AM  [facility] [address] [place], [place], [address] Phone: [phone]   Fax:  [phone]

## 2018-05-27 ENCOUNTER — Encounter: Payer: Self-pay | Admitting: Family Medicine

## 2018-05-30 ENCOUNTER — Other Ambulatory Visit: Payer: Self-pay | Admitting: Family Medicine

## 2018-06-01 ENCOUNTER — Ambulatory Visit: Payer: 59 | Admitting: Rheumatology

## 2018-06-01 ENCOUNTER — Encounter: Payer: Self-pay | Admitting: Rheumatology

## 2018-06-01 VITALS — BP 112/77 | HR 90 | Resp 12 | Ht 63.0 in | Wt 188.4 lb

## 2018-06-01 DIAGNOSIS — G4709 Other insomnia: Secondary | ICD-10-CM

## 2018-06-01 DIAGNOSIS — M7061 Trochanteric bursitis, right hip: Secondary | ICD-10-CM

## 2018-06-01 DIAGNOSIS — E559 Vitamin D deficiency, unspecified: Secondary | ICD-10-CM

## 2018-06-01 DIAGNOSIS — M5136 Other intervertebral disc degeneration, lumbar region: Secondary | ICD-10-CM

## 2018-06-01 DIAGNOSIS — M7062 Trochanteric bursitis, left hip: Secondary | ICD-10-CM | POA: Diagnosis not present

## 2018-06-01 DIAGNOSIS — Z8669 Personal history of other diseases of the nervous system and sense organs: Secondary | ICD-10-CM

## 2018-06-01 DIAGNOSIS — M19041 Primary osteoarthritis, right hand: Secondary | ICD-10-CM

## 2018-06-01 DIAGNOSIS — M797 Fibromyalgia: Secondary | ICD-10-CM

## 2018-06-01 DIAGNOSIS — G8929 Other chronic pain: Secondary | ICD-10-CM

## 2018-06-01 DIAGNOSIS — G894 Chronic pain syndrome: Secondary | ICD-10-CM

## 2018-06-01 DIAGNOSIS — Z8659 Personal history of other mental and behavioral disorders: Secondary | ICD-10-CM

## 2018-06-01 DIAGNOSIS — Z8639 Personal history of other endocrine, nutritional and metabolic disease: Secondary | ICD-10-CM

## 2018-06-01 DIAGNOSIS — G5603 Carpal tunnel syndrome, bilateral upper limbs: Secondary | ICD-10-CM

## 2018-06-01 DIAGNOSIS — M533 Sacrococcygeal disorders, not elsewhere classified: Secondary | ICD-10-CM

## 2018-06-01 DIAGNOSIS — M19042 Primary osteoarthritis, left hand: Secondary | ICD-10-CM

## 2018-06-01 DIAGNOSIS — D369 Benign neoplasm, unspecified site: Secondary | ICD-10-CM

## 2018-06-01 DIAGNOSIS — R5383 Other fatigue: Secondary | ICD-10-CM

## 2018-06-01 MED ORDER — LIDOCAINE HCL 1 % IJ SOLN
1.5000 mL | INTRAMUSCULAR | Status: AC | PRN
  Administered 2018-06-01: 1.5 mL

## 2018-06-01 MED ORDER — TRIAMCINOLONE ACETONIDE 40 MG/ML IJ SUSP
40.0000 mg | INTRAMUSCULAR | Status: AC | PRN
  Administered 2018-06-01: 40 mg via INTRA_ARTICULAR

## 2018-06-01 NOTE — Patient Instructions (Signed)
Iliotibial Band Syndrome Rehab  Ask your health care provider which exercises are safe for you. Do exercises exactly as told by your health care provider and adjust them as directed. It is normal to feel mild stretching, pulling, tightness, or discomfort as you do these exercises, but you should stop right away if you feel sudden pain or your pain gets worse. Do not begin these exercises until told by your health care provider.  Stretching and range of motion exercises  These exercises warm up your muscles and joints and improve the movement and flexibility of your hip and pelvis.  Exercise A: Quadriceps, prone    1. Lie on your abdomen on a firm surface, such as a bed or padded floor.  2. Bend your left / right knee and hold your ankle. If you cannot reach your ankle or pant leg, loop a belt around your foot and grab the belt instead.  3. Gently pull your heel toward your buttocks. Your knee should not slide out to the side. You should feel a stretch in the front of your thigh and knee.  4. Hold this position for __________ seconds.  Repeat __________ times. Complete this stretch __________ times a day.  Exercise B: Iliotibial band    1. Lie on your side with your left / right leg in the top position.  2. Bend both of your knees and grab your left / right ankle. Stretch out your bottom arm to help you balance.  3. Slowly bring your top knee back so your thigh goes behind your trunk.  4. Slowly lower your top leg toward the floor until you feel a gentle stretch on the outside of your left / right hip and thigh. If you do not feel a stretch and your knee will not fall farther, place the heel of your other foot on top of your knee and pull your knee down toward the floor with your foot.  5. Hold this position for __________ seconds.  Repeat __________ times. Complete this stretch __________ times a day.  Strengthening exercises  These exercises build strength and endurance in your hip and pelvis. Endurance is the  ability to use your muscles for a long time, even after they get tired.  Exercise C: Straight leg raises (hip abductors)    1. Lie on your side with your left / right leg in the top position. Lie so your head, shoulder, knee, and hip line up. You may bend your bottom knee to help you balance.  2. Roll your hips slightly forward so your hips are stacked directly over each other and your left / right knee is facing forward.  3. Tense the muscles in your outer thigh and lift your top leg 4-6 inches (10-15 cm).  4. Hold this position for __________ seconds.  5. Slowly return to the starting position. Let your muscles relax completely before doing another repetition.  Repeat __________ times. Complete this exercise __________ times a day.  Exercise D: Straight leg raises (hip extensors)  1. Lie on your abdomen on your bed or a firm surface. You can put a pillow under your hips if that is more comfortable.  2. Bend your left / right knee so your foot is straight up in the air.  3. Squeeze your buttock muscles and lift your left / right thigh off the bed. Do not let your back arch.  4. Tense this muscle as hard as you can without increasing any knee pain.  5. Hold   this position for __________ seconds.  6. Slowly lower your leg to the starting position and allow it to relax completely.  Repeat __________ times. Complete this exercise __________ times a day.  Exercise E: Hip hike  1. Stand sideways on a bottom step. Stand on your left / right leg with your other foot unsupported next to the step. You can hold onto the railing or wall if needed for balance.  2. Keep your knees straight and your torso square. Then, lift your left / right hip up toward the ceiling.  3. Slowly let your left / right hip lower toward the floor, past the starting position. Your foot should get closer to the floor. Do not lean or bend your knees.  Repeat __________ times. Complete this exercise __________ times a day.  This information is not  intended to replace advice given to you by your health care provider. Make sure you discuss any questions you have with your health care provider.  Document Released: 04/06/2005 Document Revised: 12/10/2015 Document Reviewed: 03/08/2015  Elsevier Interactive Patient Education © 2019 [employer]

## 2018-06-01 NOTE — Progress Notes (Signed)
Office Visit Note  Patient: [name]             Date of Birth: 06-May-1965           MRN: [id number]             PCP: Girtha Rm, NP-C Referring: Girtha Rm, NP-C Visit Date: 06/01/2018 Occupation: @GUAROCC @  Subjective:  Pain and Numbness of the Left Hip (Numbness that travels to calf. Hurts to walk, sit, stand. Hurts to lie down and interferes with sleep. )   History of Present Illness: [name] is a 53 y.o. female with history of osteoarthritis, fibromyalgia, and DDD. She reports increased discomfort in her hips (trochanteric bursa), especially the left side which is constant.  Reports it is sensitive to touch .  She states this started yesterday.   She is taking Cymbalta 60 mg and feels like it is helping her fibromyalgia but does noticed worsening of symptoms during the cold rainy weather. She was previously on tizanidine for leg cramps but is now drinking pickle juice before bed which has helped.  Her carpal tunnel syndrome symptoms are better with the braces at night.  Activities of Daily Living:  Patient reports morning stiffness for 20  minute.   Patient Reports nocturnal pain.  Difficulty dressing/grooming: Reports Difficulty climbing stairs: Reports Difficulty getting out of chair: Reports Difficulty using hands for taps, buttons, cutlery, and/or writing: Denies  Review of Systems  Constitutional: Positive for fatigue and weight loss (intentional). Negative for night sweats and weight gain.  HENT: Negative for mouth sores, trouble swallowing, trouble swallowing, mouth dryness and nose dryness.   Eyes: Negative for pain, redness, visual disturbance and dryness.  Respiratory: Negative for cough, shortness of breath and difficulty breathing.   Cardiovascular: Positive for chest pain. Negative for palpitations, hypertension, irregular heartbeat and swelling in legs/feet.  Gastrointestinal: Negative for blood in stool, constipation and diarrhea.  Endocrine:  Negative for increased urination.  Genitourinary: Negative for vaginal dryness.  Musculoskeletal: Positive for arthralgias and joint pain. Negative for joint swelling, myalgias, muscle weakness, morning stiffness, muscle tenderness and myalgias.  Skin: Negative for color change, rash, hair loss, skin tightness, ulcers and sensitivity to sunlight.  Allergic/Immunologic: Negative for susceptible to infections.  Neurological: Positive for headaches. Negative for dizziness, memory loss, night sweats and weakness.       H/o migraine  Hematological: Negative for swollen glands.  Psychiatric/Behavioral: Positive for depressed mood and sleep disturbance. The patient is nervous/anxious.     PMFS History:  Patient Active Problem List   Diagnosis Date Noted  . GAD (generalized anxiety disorder) 04/08/2018  . Carpal tunnel syndrome, left upper limb 09/16/2017  . Carpal tunnel syndrome, right upper limb 09/16/2017  . History of migraine 08/24/2017  . Diabetes ([place])   . Vitamin D deficiency 01/08/2017  . Elevated LDL cholesterol level 08/10/2016  . Adenomatous polyps 07/13/2016  . Leg cramp 06/25/2015  . Primary osteoarthritis of both hands 06/25/2015  . Hip bursitis 06/25/2015  . 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 Chronic pain syndrome 06/25/2015  . Muscle pain, fibromyalgia 06/25/2015  . Fibromyalgia 06/17/2015    Past Medical History:  Diagnosis Date  . Arthritis   . Bursitis   . Carpal tunnel syndrome   . Chronic headaches   . Constipation   . Depression   . Diabetes ([place])    diet controlled, no meds  . Fibromyalgia   . GAD (generalized anxiety disorder) 04/08/2018  . Insomnia   .  Snoring   . Tendonitis   . TMJ (dislocation of temporomandibular joint)     Family History  Problem Relation Age of Onset  . Hypertension Mother   . Glaucoma Mother   . Cataracts Mother   . Diabetes Father   . Hypertension Father   . Glaucoma Brother   . Ataxia Brother   . Fibromyalgia Daughter     . Migraines Daughter   . Migraines Daughter   . Asthma Daughter   . Colon cancer Neg Hx   . Esophageal cancer Neg Hx   . Pancreatic cancer Neg Hx   . Rectal cancer Neg Hx   . Stomach cancer Neg Hx    Past Surgical History:  Procedure Laterality Date  . ABDOMINAL HYSTERECTOMY    . CESAREAN SECTION     x3  . cyst from hand    . DENTAL SURGERY    . KNEE SURGERY     right  . TEMPOROMANDIBULAR JOINT SURGERY    . TONSILLECTOMY     Social History   Social History Narrative  . Not on file   Immunization History  Administered Date(s) Administered  . Tdap 05/11/2016     Objective: Vital Signs: BP 112/77 (BP Location: Left Arm, Patient Position: Sitting, Cuff Size: Normal)   Pulse 90   Resp 12   Ht 5\' 3"  (1.6 m)   Wt 188 lb 6.4 oz (85.5 kg)   BMI 33.37 kg/m    Physical Exam Vitals signs and nursing note reviewed.  Constitutional:      Appearance: She is well-developed.  HENT:     Head: Normocephalic and atraumatic.  Eyes:     Conjunctiva/sclera: Conjunctivae normal.  Neck:     Musculoskeletal: Normal range of motion.  Cardiovascular:     Rate and Rhythm: Normal rate and regular rhythm.     Heart sounds: Normal heart sounds.  Pulmonary:     Effort: Pulmonary effort is normal.     Breath sounds: Normal breath sounds.  Abdominal:     General: Bowel sounds are normal.     Palpations: Abdomen is soft.  Lymphadenopathy:     Cervical: No cervical adenopathy.  Skin:    General: Skin is warm and dry.     Capillary Refill: Capillary refill takes less than 2 seconds.  Neurological:     Mental Status: She is alert and oriented to person, place, and time.  Psychiatric:        Behavior: Behavior normal.      Musculoskeletal Exam: C-spine good range of motion.  She has limited range of motion of lumbar spine with discomfort.  Shoulder joints elbow joints wrist joints with good range of motion.  She has DIP and PIP thickening in her hands.  Hip joints knee joints ankles  MTPs PIPs been good range of motion with no synovitis.  She had tenderness over bilateral trochanteric bursa consistent with trochanteric bursitis.  She also has generalized pain and positive tender points from fibromyalgia.  CDAI Exam: CDAI Score: Not documented Patient Global Assessment: Not documented; Provider Global Assessment: Not documented Swollen: Not documented; Tender: Not documented Joint Exam   Not documented   There is currently no information documented on the homunculus. Go to the Rheumatology activity and complete the homunculus joint exam.  Investigation: No additional findings.  Imaging: No results found.  Recent Labs: Lab Results  Component Value Date   WBC 6.6 04/08/2018   HGB 13.1 04/08/2018   PLT 246 04/08/2018  NA 143 04/08/2018   K 4.2 04/08/2018   CL 105 04/08/2018   CO2 23 04/08/2018   GLUCOSE 99 04/08/2018   BUN 15 04/08/2018   CREATININE 0.81 04/08/2018   BILITOT 0.3 04/08/2018   ALKPHOS 108 04/08/2018   AST 15 04/08/2018   ALT 23 04/08/2018   PROT 6.6 04/08/2018   ALBUMIN 4.3 04/08/2018   CALCIUM 9.3 04/08/2018   GFRAA 95 04/08/2018    Speciality Comments: No specialty comments available.  Procedures:  Large Joint Inj: L greater trochanter on 06/01/2018 11:43 AM Indications: pain Details: 27 G 1.5 in needle, lateral approach  Arthrogram: No  Medications: 40 mg triamcinolone acetonide 40 MG/ML; 1.5 mL lidocaine 1 % Aspirate: 0 mL Outcome: tolerated well, no immediate complications Procedure, treatment alternatives, risks and benefits explained, specific risks discussed. Consent was given by the patient. Immediately prior to procedure a time out was called to verify the correct patient, procedure, equipment, support staff and site/side marked as required. Patient was prepped and draped in the usual sterile fashion.     Allergies: Amoxicillin; Doxycycline; and Penicillins   Assessment / Plan:     Visit Diagnoses: Trochanteric  bursitis of both hips -patient has severe pain and discomfort in her left trochanteric bursa.  Her right trochanteric bursa has been painful as well.  She had good response to right trochanteric bursa injection in the past.  Per her request left trochanteric bursa was injected with cortisone as described above.  IT band exercises were given and demonstrated in the office.  Plan: Large Joint Inj: L greater trochanter  Primary osteoarthritis of both hands-joint protection muscle strengthening was discussed.  DDD (degenerative disc disease), lumbar-she continues to have some lower back pain.  Weight loss diet and exercise was discussed.  Fibromyalgia-she continues to have some generalized pain and discomfort from fibromyalgia.  She is on Cymbalta 30 mg p.o. daily which is helpful.  Chronic right SI joint pain-improved.  Chronic pain syndrome-secondary to fibromyalgia and osteoarthritis.  Other fatigue-secondary to insomnia.  Other insomnia-good sleep hygiene was discussed.  Bilateral carpal tunnel syndrome-use of carpal tunnel braces has been helpful.  Adenomatous polyps  History of diabetes mellitus, type II-I advised her to monitor her blood sugar closely after the cortisone injection.  Vitamin D deficiency-she is on supplement.  History of depression  History of migraine   Orders: Orders Placed This Encounter  Procedures  . Large Joint Inj: L greater trochanter   No orders of the defined types were placed in this encounter.   Follow-Up Instructions: Return in about 6 months (around 11/30/2018).   Bo Merino, MD  Note - This record has been created using Editor, commissioning.  Chart creation errors have been sought, but may not always  have been located. Such creation errors do not reflect on  the standard of medical care.

## 2018-06-16 ENCOUNTER — Encounter: Payer: Self-pay | Admitting: Family Medicine

## 2018-06-16 ENCOUNTER — Other Ambulatory Visit: Payer: Self-pay | Admitting: *Deleted

## 2018-06-16 ENCOUNTER — Ambulatory Visit: Payer: 59 | Admitting: Family Medicine

## 2018-06-16 VITALS — BP 130/80 | HR 78 | Temp 98.2°F | Wt 187.0 lb

## 2018-06-16 DIAGNOSIS — B9689 Other specified bacterial agents as the cause of diseases classified elsewhere: Secondary | ICD-10-CM | POA: Diagnosis not present

## 2018-06-16 DIAGNOSIS — B373 Candidiasis of vulva and vagina: Secondary | ICD-10-CM | POA: Diagnosis not present

## 2018-06-16 DIAGNOSIS — N760 Acute vaginitis: Secondary | ICD-10-CM | POA: Diagnosis not present

## 2018-06-16 DIAGNOSIS — N898 Other specified noninflammatory disorders of vagina: Secondary | ICD-10-CM | POA: Diagnosis not present

## 2018-06-16 DIAGNOSIS — B3731 Acute candidiasis of vulva and vagina: Secondary | ICD-10-CM

## 2018-06-16 LAB — POCT WET PREP (WET MOUNT)
Clue Cells Wet Prep Whiff POC: POSITIVE
Trichomonas Wet Prep HPF POC: ABSENT

## 2018-06-16 MED ORDER — METRONIDAZOLE 500 MG PO TABS: 500.0000 mg | ORAL_TABLET | Freq: Two times a day (BID) | ORAL | 0 refills | Status: DC

## 2018-06-16 MED ORDER — FLUCONAZOLE 150 MG PO TABS: 150.0000 mg | ORAL_TABLET | Freq: Once | ORAL | 0 refills | Status: AC

## 2018-06-16 NOTE — Progress Notes (Signed)
   Subjective:    Patient ID: [name], female    DOB: 1965/06/11, 53 y.o.   MRN: [id number]  HPI Chief Complaint  Patient presents with  . vaginal discharge    vaginal discharge, odor, hurts around the pee area. been going on for 2 weeks.    She is here with complaints of a 2 week history of vaginal discharge, odor and itching.  States she has not had sex in over 2 years. She is married.   Denies fever, chills, abdominal pain, back pain, N/V/D, urinary symptoms.    Review of Systems Pertinent positives and negatives in the history of present illness.     Objective:   Physical Exam Exam conducted with a chaperone present.  Constitutional:      General: She is not in acute distress.    Appearance: Normal appearance.  Abdominal:     General: Abdomen is flat.     Tenderness: There is no abdominal tenderness.  Genitourinary:    Labia:        Right: No rash, tenderness or lesion.        Left: No rash, tenderness or lesion.      Vagina: Vaginal discharge and erythema present.     Comments: Thick white clumpy discharge in vaginal vault Neurological:     Mental Status: She is alert.    BP 130/80   Pulse 78   Temp 98.2 F (36.8 C) (Oral)   Wt 187 lb (84.8 kg)   BMI 33.13 kg/m       Assessment & Plan:  Candidiasis of vulva and vagina - Plan: fluconazole (DIFLUCAN) 150 MG tablet  Vaginal discharge - Plan: POCT Wet Prep ([place]), NuSwab Vaginitis Plus (VG+)  Vagina itching - Plan: POCT Wet Prep ([place]), NuSwab Vaginitis Plus (VG+)  BV (bacterial vaginosis) - Plan: metroNIDAZOLE (FLAGYL) 500 MG tablet  Wet prep +BV, yeast.  Metronidazole and diflucan prescribed with instructions to avoid alcohol.  Follow up if not back to baseline after completing antibiotics.

## 2018-06-16 NOTE — Patient Instructions (Signed)
Take the antibiotic as prescribed. Avoid alcohol with the antibiotic.   Take the diflucan for yeast infection. One dose that works for 3 days. Do not take your statin for 2 days while you take this.   Return if you are not back to baseline after treatment is done.

## 2018-06-19 LAB — NUSWAB VAGINITIS PLUS (VG+)
Candida albicans, NAA: POSITIVE — AB
Candida glabrata, NAA: NEGATIVE
Chlamydia trachomatis, NAA: NEGATIVE
Neisseria gonorrhoeae, NAA: NEGATIVE
Trich vag by NAA: NEGATIVE

## 2018-06-28 ENCOUNTER — Ambulatory Visit: Payer: BLUE CROSS/BLUE SHIELD | Admitting: Physician Assistant

## 2018-07-04 ENCOUNTER — Encounter: Payer: Self-pay | Admitting: Family Medicine

## 2018-07-10 ENCOUNTER — Other Ambulatory Visit: Payer: Self-pay | Admitting: Family Medicine

## 2018-07-11 ENCOUNTER — Encounter: Payer: Self-pay | Admitting: Family Medicine

## 2018-07-11 NOTE — Telephone Encounter (Signed)
This was refill request that was automatic, she did not request. She is taking and has plenty of refills.

## 2018-07-11 NOTE — Telephone Encounter (Signed)
Has she been taking this on a daily basis or wants to start back on this medication? If she is starting back on the medication then she should start at a lower dose. Please find out and make sure her current medication list is up to date. Thanks.

## 2018-07-12 MED ORDER — DULOXETINE HCL 60 MG PO CPEP: 60.0000 mg | ORAL_CAPSULE | Freq: Every day | ORAL | 1 refills | Status: DC

## 2018-09-02 ENCOUNTER — Other Ambulatory Visit: Payer: Self-pay | Admitting: Family Medicine

## 2018-09-02 NOTE — Telephone Encounter (Signed)
Is this okay to refill? Pt has an appt on 6/22

## 2018-09-23 ENCOUNTER — Other Ambulatory Visit: Payer: Self-pay | Admitting: Family Medicine

## 2018-09-26 ENCOUNTER — Telehealth: Payer: Self-pay | Admitting: Rheumatology

## 2018-09-26 NOTE — Progress Notes (Signed)
Office Visit Note  Patient: [name]             Date of Birth: [birth year]           MRN: [id number]             PCP: Girtha Rm, NP-C Referring: Girtha Rm, NP-C Visit Date: 09/27/2018 Occupation: @GUAROCC @  Subjective:  Generalized pain   History of Present Illness: Akiva Josey is a 53 y.o. female with history of osteoarthritis, fibromyalgia, and DDD.  She takes Cymbalta 60 mg 1 capsule by mouth daily.  She has been having a severe fibromyalgia flare since last Friday.  She reports a flare was triggered since her dog was recently admitted.  She has had increased stress and has not been sleeping well at night.  She is having generalized muscle aches and muscle tenderness at this time.  She has trapezius muscle tension and muscle tenderness bilaterally.  She had a tension headache that lasted about 3 days but has resolved.  She has mild trochanter bursitis bilaterally.  She denies any lower back or SI joint pain at this time.  She continues to have pain in bilateral hands and bilateral knee joints.  She states that the pain is intermittent.  She denies any joint swelling.  She has been taking ibuprofen 200 mg 4 tablets by mouth twice daily.  She currently rates her pain an 8 out of 10 and her level of fatigue is a 10 out of 10.    Activities of Daily Living:  Patient reports joint stiffness all day. Patient Reports nocturnal pain.  Difficulty dressing/grooming: Reports Difficulty climbing stairs: Reports Difficulty getting out of chair: Reports Difficulty using hands for taps, buttons, cutlery, and/or writing: Reports  Review of Systems  Constitutional: Positive for fatigue.  HENT: Positive for mouth dryness. Negative for mouth sores and nose dryness.   Eyes: Positive for pain and dryness. Negative for visual disturbance.  Respiratory: Negative for cough, hemoptysis, shortness of breath, wheezing and difficulty breathing.   Cardiovascular: Negative for chest pain,  palpitations, hypertension and swelling in legs/feet.  Gastrointestinal: Negative for abdominal pain, blood in stool, constipation and diarrhea.  Endocrine: Negative for increased urination.  Genitourinary: Negative for difficulty urinating and painful urination.  Musculoskeletal: Positive for arthralgias, joint pain, morning stiffness and muscle tenderness. Negative for joint swelling, myalgias, muscle weakness and myalgias.  Skin: Negative for color change, pallor, rash, hair loss, nodules/bumps, redness, skin tightness, ulcers and sensitivity to sunlight.  Neurological: Negative for dizziness, numbness, headaches and weakness.  Hematological: Negative for swollen glands.  Psychiatric/Behavioral: Positive for sleep disturbance. Negative for depressed mood. The patient is not nervous/anxious.     PMFS History:  Patient Active Problem List   Diagnosis Date Noted  . GAD (generalized anxiety disorder) 04/08/2018  . Carpal tunnel syndrome, left upper limb 09/16/2017  . Carpal tunnel syndrome, right upper limb 09/16/2017  . History of migraine 08/24/2017  . Diabetes ([place])   . Vitamin D deficiency 01/08/2017  . Elevated LDL cholesterol level 08/10/2016  . Adenomatous polyps 07/13/2016  . Leg cramp 06/25/2015  . Primary osteoarthritis of both hands 06/25/2015  . Hip bursitis 06/25/2015  . 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 Chronic pain syndrome 06/25/2015  . Muscle pain, fibromyalgia 06/25/2015  . Fibromyalgia 06/17/2015    Past Medical History:  Diagnosis Date  . Arthritis   . Bursitis   . Carpal tunnel syndrome   . Chronic headaches   . Constipation   .  Depression   . Diabetes ([place])    diet controlled, no meds  . Fibromyalgia   . GAD (generalized anxiety disorder) 04/08/2018  . Insomnia   . Snoring   . Tendonitis   . TMJ (dislocation of temporomandibular joint)     Family History  Problem Relation Age of Onset  . Hypertension Mother   . Glaucoma Mother   . Cataracts Mother    . Diabetes Father   . Hypertension Father   . Glaucoma Brother   . Ataxia Brother   . Fibromyalgia Daughter   . Migraines Daughter   . Migraines Daughter   . Asthma Daughter   . Colon cancer Neg Hx   . Esophageal cancer Neg Hx   . Pancreatic cancer Neg Hx   . Rectal cancer Neg Hx   . Stomach cancer Neg Hx    Past Surgical History:  Procedure Laterality Date  . ABDOMINAL HYSTERECTOMY    . CESAREAN SECTION     x3  . cyst from hand    . DENTAL SURGERY    . KNEE SURGERY     right  . TEMPOROMANDIBULAR JOINT SURGERY    . TONSILLECTOMY     Social History   Social History Narrative  . Not on file   Immunization History  Administered Date(s) Administered  . Tdap 05/11/2016     Objective: Vital Signs: BP 117/68 (BP Location: Left Arm, Patient Position: Sitting, Cuff Size: Normal)   Pulse (!) 126   Resp 12   Ht 5\' 3"  (1.6 m)   Wt 189 lb (85.7 kg)   BMI 33.48 kg/m    Physical Exam Vitals signs and nursing note reviewed.  Constitutional:      Appearance: She is well-developed.  HENT:     Head: Normocephalic and atraumatic.  Eyes:     Conjunctiva/sclera: Conjunctivae normal.  Neck:     Musculoskeletal: Normal range of motion.  Cardiovascular:     Rate and Rhythm: Normal rate and regular rhythm.     Heart sounds: Normal heart sounds.  Pulmonary:     Effort: Pulmonary effort is normal.     Breath sounds: Normal breath sounds.  Abdominal:     General: Bowel sounds are normal.     Palpations: Abdomen is soft.  Lymphadenopathy:     Cervical: No cervical adenopathy.  Skin:    General: Skin is warm and dry.     Capillary Refill: Capillary refill takes less than 2 seconds.  Neurological:     Mental Status: She is alert and oriented to person, place, and time.  Psychiatric:        Behavior: Behavior normal.      Musculoskeletal Exam: Generalized hyperalgesia and positive tender points on exam. C-spine, thoracic spine, lumbar spine good range of motion.  No  midline spinal tenderness.  No SI joint tenderness.  Shoulder joints, elbow joints, wrist joints, MCPs and PIPs and DIPs good range of motion no synovitis.  She has complete fist formation bilaterally.  Hip joints have good range of motion with no discomfort.  She has tenderness over bilateral trochanteric bursa.  Knee joints have good range of motion with no warmth or effusion.  She has bilateral knee crepitus.  No tenderness or swelling of ankle joints.  CDAI Exam: CDAI Score: Not documented Patient Global Assessment: Not documented; Provider Global Assessment: Not documented Swollen: Not documented; Tender: Not documented Joint Exam   Not documented   There is currently no information documented on the  homunculus. Go to the Rheumatology activity and complete the homunculus joint exam.  Investigation: No additional findings.  Imaging: No results found.  Recent Labs: Lab Results  Component Value Date   WBC 6.6 04/08/2018   HGB 13.1 04/08/2018   PLT 246 04/08/2018   NA 143 04/08/2018   K 4.2 04/08/2018   CL 105 04/08/2018   CO2 23 04/08/2018   GLUCOSE 99 04/08/2018   BUN 15 04/08/2018   CREATININE 0.81 04/08/2018   BILITOT 0.3 04/08/2018   ALKPHOS 108 04/08/2018   AST 15 04/08/2018   ALT 23 04/08/2018   PROT 6.6 04/08/2018   ALBUMIN 4.3 04/08/2018   CALCIUM 9.3 04/08/2018   GFRAA 95 04/08/2018    Speciality Comments: No specialty comments available.  Procedures:  No procedures performed Allergies: Amoxicillin; Doxycycline; and Penicillins   Assessment / Plan:     Visit Diagnoses: Primary osteoarthritis of both hands: She has no synovial thickening, tenderness, or synovitis at this time.  She has complete fist formation bilaterally.  She has intermittent discomfort in bilateral hands.  Joint protection and muscle strengthening were discussed.  She was advised to notify us if she develops increased joint pain or joint swelling.  Follow-up in the office in 6 months.   Fibromyalgia: She has generalized hyperalgesia and positive tender points on exam.  She presents today with currently having a fibromyalgia flare.  Her flare started about 5 days ago.  She is been under increased stress since her dog has been in the bed recently.  She has not been getting good rest at night.  She rates her level of fatigue a 10 out of 10.  Her level of pain is currently an 8 out of 10.  She has generalized muscle aches and muscle tenderness.  She is trapezius muscle tension and muscle tenderness bilaterally.  She is having bilateral trochanter bursitis.  We discussed the importance of performing stretching exercises on a regular basis.  The importance of regular exercise and good sleep hygiene was discussed.  Trochanteric bursitis of both hips: She has tenderness over bilateral trochanter bursa.  She was encouraged to perform stretching exercises advised  Chronic pain syndrome: She take Cymbalta 60 mg 1 capsule by mouth daily.  When she is having increased discomfort she takes ibuprofen 200 mg 4 tablets by mouth twice daily for pain relief.   Other fatigue: She has chronic fatigue.  Her fatigue has been worse over the past 1 week.  She currently is rating her level of fatigue at 10 out of 10.  We discussed importance of regular exercise and good sleep hygiene.  Other insomnia: She has been having difficulty sleeping at night due to discomfort she has been experiencing.  She has been under increased stress since her dog is recently been in the event which has been mainly difficult to sleep at night.  Good sleep hygiene was discussed  DDD (degenerative disc disease), lumbar: She has no lower back pain at this time.  She has no symptoms of radiculopathy.   Chronic right SI joint pain: She has no SI joint tenderness on exam.   Bilateral carpal tunnel syndrome: She is asymptomatic at this time.   Other medical conditions are listed as follows:   Adenomatous polyps  History of  diabetes mellitus, type II  Vitamin D deficiency  History of depression  History of migraine   Orders: No orders of the defined types were placed in this encounter.  No orders of the defined types  were placed in this encounter.    Follow-Up Instructions: Return in about 6 months (around 03/29/2019) for Fibromyalgia, Osteoarthritis, DDD.   Ofilia Neas, PA-C   I examined and evaluated the patient with Hazel Sams PA. The plan of care was discussed as noted above.  Bo Merino, MD Note - This record has been created using Editor, commissioning.  Chart creation errors have been sought, but may not always  have been located. Such creation errors do not reflect on  the standard of medical care.

## 2018-09-26 NOTE — Telephone Encounter (Signed)
Patient is scheduled for an appointment tomorrow 09/27/2018.

## 2018-09-26 NOTE — Telephone Encounter (Signed)
Patient is having a Fibromyalgia flare that started last Friday. It is progressively getting worse. Please call to discuss. Patient uses [facility] on [address]

## 2018-09-27 ENCOUNTER — Ambulatory Visit (INDEPENDENT_AMBULATORY_CARE_PROVIDER_SITE_OTHER): Payer: 59 | Admitting: Physician Assistant

## 2018-09-27 ENCOUNTER — Other Ambulatory Visit: Payer: Self-pay

## 2018-09-27 ENCOUNTER — Encounter: Payer: Self-pay | Admitting: Physician Assistant

## 2018-09-27 VITALS — BP 117/68 | HR 126 | Resp 12 | Ht 63.0 in | Wt 189.0 lb

## 2018-09-27 DIAGNOSIS — M19041 Primary osteoarthritis, right hand: Secondary | ICD-10-CM

## 2018-09-27 DIAGNOSIS — G8929 Other chronic pain: Secondary | ICD-10-CM

## 2018-09-27 DIAGNOSIS — Z8669 Personal history of other diseases of the nervous system and sense organs: Secondary | ICD-10-CM

## 2018-09-27 DIAGNOSIS — Z8659 Personal history of other mental and behavioral disorders: Secondary | ICD-10-CM

## 2018-09-27 DIAGNOSIS — R5383 Other fatigue: Secondary | ICD-10-CM

## 2018-09-27 DIAGNOSIS — M5136 Other intervertebral disc degeneration, lumbar region: Secondary | ICD-10-CM

## 2018-09-27 DIAGNOSIS — M533 Sacrococcygeal disorders, not elsewhere classified: Secondary | ICD-10-CM

## 2018-09-27 DIAGNOSIS — M7061 Trochanteric bursitis, right hip: Secondary | ICD-10-CM

## 2018-09-27 DIAGNOSIS — G894 Chronic pain syndrome: Secondary | ICD-10-CM | POA: Diagnosis not present

## 2018-09-27 DIAGNOSIS — M7062 Trochanteric bursitis, left hip: Secondary | ICD-10-CM

## 2018-09-27 DIAGNOSIS — G5603 Carpal tunnel syndrome, bilateral upper limbs: Secondary | ICD-10-CM

## 2018-09-27 DIAGNOSIS — D369 Benign neoplasm, unspecified site: Secondary | ICD-10-CM

## 2018-09-27 DIAGNOSIS — M19042 Primary osteoarthritis, left hand: Secondary | ICD-10-CM

## 2018-09-27 DIAGNOSIS — E559 Vitamin D deficiency, unspecified: Secondary | ICD-10-CM

## 2018-09-27 DIAGNOSIS — M797 Fibromyalgia: Secondary | ICD-10-CM | POA: Diagnosis not present

## 2018-09-27 DIAGNOSIS — Z8639 Personal history of other endocrine, nutritional and metabolic disease: Secondary | ICD-10-CM

## 2018-09-27 DIAGNOSIS — G4709 Other insomnia: Secondary | ICD-10-CM

## 2018-09-27 NOTE — Patient Instructions (Signed)
Neck Exercises Neck exercises can be important for many reasons:  They can help you to improve and maintain flexibility in your neck. This can be especially important as you age.  They can help to make your neck stronger. This can make movement easier.  They can reduce or prevent neck pain.  They may help your upper back. Ask your health care provider which neck exercises would be best for you. Exercises to improve neck flexibility Neck stretch Repeat this exercise 3-5 times. 1. Do this exercise while standing or while sitting in a chair. 2. Place your feet flat on the floor, shoulder-width apart. 3. Slowly turn your head to the right. Turn it all the way to the right so you can look over your right shoulder. Do not tilt or tip your head. 4. Hold this position for 10-30 seconds. 5. Slowly turn your head to the left, to look over your left shoulder. 6. Hold this position for 10-30 seconds.  Neck retraction Repeat this exercise 8-10 times. Do this 3-4 times a day or as told by your health care provider. 1. Do this exercise while standing or while sitting in a sturdy chair. 2. Look straight ahead. Do not bend your neck. 3. Use your fingers to push your chin backward. Do not bend your neck for this movement. Continue to face straight ahead. If you are doing the exercise properly, you will feel a slight sensation in your throat and a stretch at the back of your neck. 4. Hold the stretch for 1-2 seconds. Relax and repeat. Exercises to improve neck strength Neck press Repeat this exercise 10 times. Do it first thing in the morning and right before bed or as told by your health care provider. 1. Lie on your back on a firm bed or on the floor with a pillow under your head. 2. Use your neck muscles to push your head down on the pillow and straighten your spine. 3. Hold the position as well as you can. Keep your head facing up and your chin tucked. 4. Slowly count to 5 while holding this  position. 5. Relax for a few seconds. Then repeat. Isometric strengthening Do a full set of these exercises 2 times a day or as told by your health care provider. 1. Sit in a supportive chair and place your hand on your forehead. 2. Push forward with your head and neck while pushing back with your hand. Hold for 10 seconds. 3. Relax. Then repeat the exercise 3 times. 4. Next, do thesequence again, this time putting your hand against the back of your head. Use your head and neck to push backward against the hand pressure. 5. Finally, do the same exercise on either side of your head, pushing sideways against the pressure of your hand. Prone head lifts Repeat this exercise 5 times. Do this 2 times a day or as told by your health care provider. 1. Lie face-down, resting on your elbows so that your chest and upper back are raised. 2. Start with your head facing downward, near your chest. Position your chin either on or near your chest. 3. Slowly lift your head upward. Lift until you are looking straight ahead. Then continue lifting your head as far back as you can stretch. 4. Hold your head up for 5 seconds. Then slowly lower it to your starting position. Supine head lifts Repeat this exercise 8-10 times. Do this 2 times a day or as told by your health care provider. 1. [name]  on your back, bending your knees to point to the ceiling and keeping your feet flat on the floor. 2. Lift your head slowly off the floor, raising your chin toward your chest. 3. Hold for 5 seconds. 4. Relax and repeat. Scapular retraction Repeat this exercise 5 times. Do this 2 times a day or as told by your health care provider. 1. Stand with your arms at your sides. Look straight ahead. 2. Slowly pull both shoulders backward and downward until you feel a stretch between your shoulder blades in your upper back. 3. Hold for 10-30 seconds. 4. Relax and repeat. Contact a health care provider if:  Your neck pain or  discomfort gets much worse when you do an exercise.  Your neck pain or discomfort does not improve within 2 hours after you exercise. If you have any of these problems, stop exercising right away. Do not do the exercises again unless your health care provider says that you can. Get help right away if:  You develop sudden, severe neck pain. If this happens, stop exercising right away. Do not do the exercises again unless your health care provider says that you can. This information is not intended to replace advice given to you by your health care provider. Make sure you discuss any questions you have with your health care provider. Document Released: 03/18/2015 Document Revised: 08/10/2017 Document Reviewed: 10/15/2014 Elsevier Interactive Patient Education  2019 [place]. Trochanteric Bursitis Rehab Ask your health care provider which exercises are safe for you. Do exercises exactly as told by your health care provider and adjust them as directed. It is normal to feel mild stretching, pulling, tightness, or discomfort as you do these exercises, but you should stop right away if you feel sudden pain or your pain gets worse.Do not begin these exercises until told by your health care provider. Stretching exercises These exercises warm up your muscles and joints and improve the movement and flexibility of your hip. These exercises also help to relieve pain and stiffness. Exercise A: Iliotibial band stretch  1. Lie on your side with your left / right leg in the top position. 2. Bend your left / right knee and grab your ankle. 3. Slowly bring your knee back so your thigh is behind your body. 4. Slowly lower your knee toward the floor until you feel a gentle stretch on the outside of your left / right thigh. If you do not feel a stretch and your knee will not fall farther, place the heel of your other foot on top of your outer knee and pull your thigh down farther. 5. Hold this position for  __________ seconds. 6. Slowly return to the starting position. Repeat __________ times. Complete this exercise __________ times a day. Strengthening exercises These exercises build strength and endurance in your hip and pelvis. Endurance is the ability to use your muscles for a long time, even after they get tired. Exercise B: Bridge (hip extensors)  1. Lie on your back on a firm surface with your knees bent and your feet flat on the floor. 2. Tighten your buttocks muscles and lift your buttocks off the floor until your trunk is level with your thighs. You should feel the muscles working in your buttocks and the back of your thighs. If this exercise is too easy, try doing it with your arms crossed over your chest. 3. Hold this position for __________ seconds. 4. Slowly return to the starting position. 5. Let your muscles relax completely between  repetitions. Repeat __________ times. Complete this exercise __________ times a day. Exercise C: Squats (knee extensors and  quadriceps) 1. Stand in front of a table, with your feet and knees pointing straight ahead. You may rest your hands on the table for balance but not for support. 2. Slowly bend your knees and lower your hips like you are going to sit in a chair. ? Keep your weight over your heels, not over your toes. ? Keep your lower legs upright so they are parallel with the table legs. ? Do not let your hips go lower than your knees. ? Do not bend lower than told by your health care provider. ? If your hip pain increases, do not bend as low. 3. Hold this position for __________ seconds. 4. Slowly push with your legs to return to standing. Do not use your hands to pull yourself to standing. Repeat __________ times. Complete this exercise __________ times a day. Exercise D: Hip hike 1. Stand sideways on a bottom step. Stand on your left / right leg with your other foot unsupported next to the step. You can hold onto the railing or wall if  needed for balance. 2. Keeping your knees straight and your torso square, lift your left / right hip up toward the ceiling. 3. Hold this position for __________ seconds. 4. Slowly let your left / right hip lower toward the floor, past the starting position. Your foot should get closer to the floor. Do not lean or bend your knees. Repeat __________ times. Complete this exercise __________ times a day. Exercise E: Single leg stand 1. Stand near a counter or door frame that you can hold onto for balance as needed. It is helpful to stand in front of a mirror for this exercise so you can watch your hip. 2. Squeeze your left / right buttock muscles then lift up your other foot. Do not let your left / right hip push out to the side. 3. Hold this position for __________ seconds. Repeat __________ times. Complete this exercise __________ times a day. This information is not intended to replace advice given to you by your health care provider. Make sure you discuss any questions you have with your health care provider. Document Released: 05/14/2004 Document Revised: 12/12/2015 Document Reviewed: 03/22/2015 Elsevier Interactive Patient Education  2019 [employer].

## 2018-10-10 ENCOUNTER — Ambulatory Visit (INDEPENDENT_AMBULATORY_CARE_PROVIDER_SITE_OTHER): Payer: 59 | Admitting: Family Medicine

## 2018-10-10 ENCOUNTER — Other Ambulatory Visit: Payer: Self-pay

## 2018-10-10 ENCOUNTER — Encounter: Payer: Self-pay | Admitting: Family Medicine

## 2018-10-10 VITALS — BP 122/82 | HR 109 | Temp 97.9°F | Ht 65.0 in | Wt 186.0 lb

## 2018-10-10 DIAGNOSIS — E559 Vitamin D deficiency, unspecified: Secondary | ICD-10-CM | POA: Diagnosis not present

## 2018-10-10 DIAGNOSIS — Z0000 Encounter for general adult medical examination without abnormal findings: Secondary | ICD-10-CM | POA: Diagnosis not present

## 2018-10-10 DIAGNOSIS — Z8639 Personal history of other endocrine, nutritional and metabolic disease: Secondary | ICD-10-CM | POA: Diagnosis not present

## 2018-10-10 DIAGNOSIS — Z2821 Immunization not carried out because of patient refusal: Secondary | ICD-10-CM

## 2018-10-10 DIAGNOSIS — E119 Type 2 diabetes mellitus without complications: Secondary | ICD-10-CM | POA: Diagnosis not present

## 2018-10-10 DIAGNOSIS — E7800 Pure hypercholesterolemia, unspecified: Secondary | ICD-10-CM | POA: Diagnosis not present

## 2018-10-10 LAB — POCT GLYCOSYLATED HEMOGLOBIN (HGB A1C): Hemoglobin A1C: 6.4 % — AB (ref 4.0–5.6)

## 2018-10-10 MED ORDER — LOSARTAN POTASSIUM 25 MG PO TABS: 25.0000 mg | ORAL_TABLET | Freq: Every day | ORAL | 1 refills | Status: DC

## 2018-10-10 NOTE — Progress Notes (Signed)
Subjective:    Patient ID: [name], female    DOB: 17-Oct-1965, 53 y.o.   MRN: [id number]  HPI Chief Complaint  Patient presents with  . fasting cpe    fasting cpe, DM- blood sugars all over the place- gets shaky fast so constantly eating,   She is here for a complete physical exam and follow up on chronic health conditions.   Recent fibromyalgia flare up.  She sees Dr. Estanislado Pandy for this.  Vitamin D def- has not been taking vitamin D supplement for the past 3 weeks.   Other providers: GI- Dr. Loletha Carrow  Rheumatologist- Dr. Estanislado Pandy   Diabetes- FBS 98-155 Taking Ozempic once weekly and doing well with this.  She has had significant weight loss since being diagnosed with diabetes and taking Ozempic.  She typically watches her diet  I put her on a small dose of lisinopril due to diabetes and standards of care.  She states this medication triggered migraine headaches so she stopped it months ago.  Taking simvastatin daily and no side effects.   States she felt shaky before dinner yesterday and her BS was 140.   History of goiter.   Social history: Lives with her husband, works as the the [employer] Denies smoking, drinking alcohol, drug use  Diet: She often skips breakfast and then snacks throughout the day.  She chooses unhealthy snacks at times such as potato chips. Excerise: She works at [employer] but due to the coronavirus pandemic she has not been able to exercise  Immunizations: declines pneumonia vaccine   Health maintenance:  Mammogram: 06/2016. Scheduled for this week.  Colonoscopy: 06/2016. Due for recall in 2018  Last Gynecological Exam: declines  Last Dental Exam: every 6 months  Last Eye Exam: scheduled for this week   Wears seatbelt always, smoke detectors in home and functioning, does not text while driving and feels safe in home environment.   Reviewed allergies, medications, past medical, surgical, family, and social history.    Review of Systems Review  of Systems Constitutional: -fever, -chills, -sweats, -unexpected weight change,-fatigue ENT: -runny nose, -ear pain, -sore throat Cardiology:  -chest pain, -palpitations, -edema Respiratory: -cough, -shortness of breath, -wheezing Gastroenterology: -abdominal pain, -nausea, -vomiting, -diarrhea, -constipation  Hematology: -bleeding or bruising problems Musculoskeletal: -arthralgias, -myalgias, -joint swelling, -back pain Ophthalmology: -vision changes Urology: -dysuria, -difficulty urinating, -hematuria, -urinary frequency, -urgency Neurology: -headache, -weakness, -tingling, -numbness       Objective:   Physical Exam BP 122/82   Pulse (!) 109   Temp 97.9 F (36.6 C) (Oral)   Ht 5\' 5"  (1.651 m)   Wt 186 lb (84.4 kg)   SpO2 98%   BMI 30.95 kg/m   General Appearance:    Alert, cooperative, no distress, appears stated age  Head:    Normocephalic, without obvious abnormality, atraumatic  Eyes:    PERRL, conjunctiva/corneas clear, EOM's intact, fundi    benign  Ears:    Normal TM's and external ear canals  Nose:   Mask in place   Throat:   Mask in place   Neck:   Supple, no lymphadenopathy;  thyroid:  no   enlargement/tenderness/nodules; no carotid   bruit or JVD  Back:    Spine nontender, no curvature, ROM normal, no CVA     tenderness  Lungs:     Clear to auscultation bilaterally without wheezes, rales or     ronchi; respirations unlabored  Chest Wall:    No tenderness or deformity   Heart:  Regular rate and rhythm, S1 and S2 normal, no murmur, rub   or gallop  Breast Exam:    Declines. Mammogram upcoming   Abdomen:     Soft, non-tender, nondistended, normoactive bowel sounds,    no masses, no hepatosplenomegaly  Genitalia:    Declines      Extremities:   No clubbing, cyanosis or edema  Pulses:   2+ and symmetric all extremities  Skin:   Skin color, texture, turgor normal, no rashes or lesions  Lymph nodes:   Cervical, supraclavicular, and axillary nodes normal   Neurologic:   CNII-XII intact, normal strength, sensation and gait; reflexes 2+ and symmetric throughout          Psych:   Normal mood, affect, hygiene and grooming.        Assessment & Plan:  Routine general medical examination at a health care facility - Plan: CBC with Differential/Platelet, Comprehensive metabolic panel, TSH, T4, free  History of thyroid disorder - Plan: TSH, T4, free  Controlled type 2 diabetes mellitus without complication, without long-term current use of insulin (HCC) - Plan: HgB A1c, losartan (COZAAR) 25 MG tablet  Elevated LDL cholesterol level - Plan: Lipid panel  Vitamin D deficiency - Plan: VITAMIN D 25 Hydroxy (Vit-D Deficiency, Fractures)  Immunization refused -  She is here today for fasting CPE and to follow-up on chronic health conditions. She declines immunizations today.  We did discussed that due to her having diabetes that she should have a pneumonia vaccine. Hgb A1c 6.4^% and diabetes is well controlled.  Continue on Ozempic. Counseling on healthy diet and exercise.  Discussed eating protein at least 3 times per day and this can be in many forms including lean meat, yogurt, cheese and other examples were given.  Encouraged her to exercise and that this would also help with her fibromyalgia. She is taking her statin.  We will recheck lipids. Recheck vitamin D level and adjust dose of supplement as needed. History of goiter that resolved spontaneously per patient.  Continue to monitor thyroid function. She will call and schedule her mammogram and diabetic eye exam. She is aware that a pelvic exam is recommended even though she has had a hysterectomy.  She declines this today. Follow-up pending labs.

## 2018-10-10 NOTE — Patient Instructions (Addendum)
Call and schedule your mammogram.   Call and schedule your diabetic eye exam.   Make sure you are eating small frequent meals and getting protein in your diet.  Stay well hydrated by drinking water (6-8 glasses per day).   Your diabetes is well controlled with a hemoglobin A1c of 6.4%.   Continue on your current medications and I am adding losartan 25 mg once daily.   Take vitamin D supplement. We will check your level today.   We will call you with your lab results and have you return in 6 months or sooner if needed.     Preventive Care 40-64 Years, Female Preventive care refers to lifestyle choices and visits with your health care provider that can promote health and wellness. What does preventive care include?   A yearly physical exam. This is also called an annual well check.  Dental exams once or twice a year.  Routine eye exams. Ask your health care provider how often you should have your eyes checked.  Personal lifestyle choices, including: ? Daily care of your teeth and gums. ? Regular physical activity. ? Eating a healthy diet. ? Avoiding tobacco and drug use. ? Limiting alcohol use. ? Practicing safe sex. ? Taking low-dose aspirin daily starting at age 3. ? Taking vitamin and mineral supplements as recommended by your health care provider. What happens during an annual well check? The services and screenings done by your health care provider during your annual well check will depend on your age, overall health, lifestyle risk factors, and family history of disease. Counseling Your health care provider may ask you questions about your:  Alcohol use.  Tobacco use.  Drug use.  Emotional well-being.  Home and relationship well-being.  Sexual activity.  Eating habits.  Work and work Statistician.  Method of birth control.  Menstrual cycle.  Pregnancy history. Screening You may have the following tests or measurements:  Height, weight, and BMI.   Blood pressure.  Lipid and cholesterol levels. These may be checked every 5 years, or more frequently if you are over 66 years old.  Skin check.  Lung cancer screening. You may have this screening every year starting at age 63 if you have a 30-pack-year history of smoking and currently smoke or have quit within the past 15 years.  Colorectal cancer screening. All adults should have this screening starting at age 14 and continuing until age 80. Your health care provider may recommend screening at age 65. You will have tests every 1-10 years, depending on your results and the type of screening test. People at increased risk should start screening at an earlier age. Screening tests may include: ? Guaiac-based fecal occult blood testing. ? Fecal immunochemical test (FIT). ? Stool DNA test. ? Virtual colonoscopy. ? Sigmoidoscopy. During this test, a flexible tube with a tiny camera (sigmoidoscope) is used to examine your rectum and lower colon. The sigmoidoscope is inserted through your anus into your rectum and lower colon. ? Colonoscopy. During this test, a long, thin, flexible tube with a tiny camera (colonoscope) is used to examine your entire colon and rectum.  Hepatitis C blood test.  Hepatitis B blood test.  Sexually transmitted disease (STD) testing.  Diabetes screening. This is done by checking your blood sugar (glucose) after you have not eaten for a while (fasting). You may have this done every 1-3 years.  Mammogram. This may be done every 1-2 years. Talk to your health care provider about when you should start  having regular mammograms. This may depend on whether you have a family history of breast cancer.  BRCA-related cancer screening. This may be done if you have a family history of breast, ovarian, tubal, or peritoneal cancers.  Pelvic exam and Pap test. This may be done every 3 years starting at age 80. Starting at age 43, this may be done every 5 years if you have a Pap test  in combination with an HPV test.  Bone density scan. This is done to screen for osteoporosis. You may have this scan if you are at high risk for osteoporosis. Discuss your test results, treatment options, and if necessary, the need for more tests with your health care provider. Vaccines Your health care provider may recommend certain vaccines, such as:  Influenza vaccine. This is recommended every year.  Tetanus, diphtheria, and acellular pertussis (Tdap, Td) vaccine. You may need a Td booster every 10 years.  Varicella vaccine. You may need this if you have not been vaccinated.  Zoster vaccine. You may need this after age 23.  Measles, mumps, and rubella (MMR) vaccine. You may need at least one dose of MMR if you were born in 1957 or later. You may also need a second dose.  Pneumococcal 13-valent conjugate (PCV13) vaccine. You may need this if you have certain conditions and were not previously vaccinated.  Pneumococcal polysaccharide (PPSV23) vaccine. You may need one or two doses if you smoke cigarettes or if you have certain conditions.  Meningococcal vaccine. You may need this if you have certain conditions.  Hepatitis A vaccine. You may need this if you have certain conditions or if you travel or work in places where you may be exposed to hepatitis A.  Hepatitis B vaccine. You may need this if you have certain conditions or if you travel or work in places where you may be exposed to hepatitis B.  Haemophilus influenzae type b (Hib) vaccine. You may need this if you have certain conditions. Talk to your health care provider about which screenings and vaccines you need and how often you need them. This information is not intended to replace advice given to you by your health care provider. Make sure you discuss any questions you have with your health care provider. Document Released: 05/03/2015 Document Revised: 05/27/2017 Document Reviewed: 02/05/2015 Elsevier Interactive Patient  Education  2019 [employer].

## 2018-10-11 LAB — COMPREHENSIVE METABOLIC PANEL
ALT: 38 IU/L — ABNORMAL HIGH (ref 0–32)
AST: 27 IU/L (ref 0–40)
Albumin/Globulin Ratio: 1.6 (ref 1.2–2.2)
Albumin: 4.2 g/dL (ref 3.8–4.9)
Alkaline Phosphatase: 98 IU/L (ref 39–117)
BUN/Creatinine Ratio: 19 (ref 9–23)
BUN: 15 mg/dL (ref 6–24)
Bilirubin Total: 0.3 mg/dL (ref 0.0–1.2)
CO2: 22 mmol/L (ref 20–29)
Calcium: 9.5 mg/dL (ref 8.7–10.2)
Chloride: 104 mmol/L (ref 96–106)
Creatinine, Ser: 0.78 mg/dL (ref 0.57–1.00)
GFR calc Af Amer: 100 mL/min/{1.73_m2} (ref >59)
GFR calc non Af Amer: 86 mL/min/{1.73_m2} (ref >59)
Globulin, Total: 2.7 g/dL (ref 1.5–4.5)
Glucose: 104 mg/dL — ABNORMAL HIGH (ref 65–99)
Potassium: 4.3 mmol/L (ref 3.5–5.2)
Sodium: 141 mmol/L (ref 134–144)
Total Protein: 6.9 g/dL (ref 6.0–8.5)

## 2018-10-11 LAB — T4, FREE: Free T4: 1.13 ng/dL (ref 0.82–1.77)

## 2018-10-11 LAB — CBC WITH DIFFERENTIAL/PLATELET
Basophils Absolute: 0 10*3/uL (ref 0.0–0.2)
Basos: 0 %
EOS (ABSOLUTE): 0.1 10*3/uL (ref 0.0–0.4)
Eos: 1 %
Hematocrit: 37.1 % (ref 34.0–46.6)
Hemoglobin: 12.5 g/dL (ref 11.1–15.9)
Immature Grans (Abs): 0 10*3/uL (ref 0.0–0.1)
Immature Granulocytes: 1 %
Lymphocytes Absolute: 1.9 10*3/uL (ref 0.7–3.1)
Lymphs: 29 %
MCH: 28.1 pg (ref 26.6–33.0)
MCHC: 33.7 g/dL (ref 31.5–35.7)
MCV: 83 fL (ref 79–97)
Monocytes Absolute: 0.5 10*3/uL (ref 0.1–0.9)
Monocytes: 7 %
Neutrophils Absolute: 3.9 10*3/uL (ref 1.4–7.0)
Neutrophils: 62 %
Platelets: 427 10*3/uL (ref 150–450)
RBC: 4.45 x10E6/uL (ref 3.77–5.28)
RDW: 13.3 % (ref 11.7–15.4)
WBC: 6.4 10*3/uL (ref 3.4–10.8)

## 2018-10-11 LAB — LIPID PANEL
Chol/HDL Ratio: 3.5 ratio (ref 0.0–4.4)
Cholesterol, Total: 197 mg/dL (ref 100–199)
HDL: 57 mg/dL (ref >39)
LDL Calculated: 112 mg/dL — ABNORMAL HIGH (ref 0–99)
Triglycerides: 142 mg/dL (ref 0–149)
VLDL Cholesterol Cal: 28 mg/dL (ref 5–40)

## 2018-10-11 LAB — VITAMIN D 25 HYDROXY (VIT D DEFICIENCY, FRACTURES): Vit D, 25-Hydroxy: 35.6 ng/mL (ref 30.0–100.0)

## 2018-10-11 LAB — TSH: TSH: 1.36 u[IU]/mL (ref 0.450–4.500)

## 2018-10-12 ENCOUNTER — Other Ambulatory Visit: Payer: Self-pay | Admitting: Internal Medicine

## 2018-10-12 ENCOUNTER — Encounter: Payer: Self-pay | Admitting: Family Medicine

## 2018-10-12 DIAGNOSIS — R7401 Elevation of levels of liver transaminase levels: Secondary | ICD-10-CM

## 2018-10-16 ENCOUNTER — Other Ambulatory Visit: Payer: Self-pay | Admitting: Family Medicine

## 2018-10-16 DIAGNOSIS — E119 Type 2 diabetes mellitus without complications: Secondary | ICD-10-CM

## 2018-11-05 ENCOUNTER — Other Ambulatory Visit: Payer: Self-pay | Admitting: Family Medicine

## 2018-11-05 LAB — HM DIABETES EYE EXAM

## 2018-11-09 ENCOUNTER — Other Ambulatory Visit: Payer: 59

## 2018-11-09 ENCOUNTER — Other Ambulatory Visit: Payer: Self-pay

## 2018-11-09 DIAGNOSIS — R7401 Elevation of levels of liver transaminase levels: Secondary | ICD-10-CM

## 2018-11-10 LAB — HEPATIC FUNCTION PANEL
ALT: 20 IU/L (ref 0–32)
AST: 15 IU/L (ref 0–40)
Albumin: 4.4 g/dL (ref 3.8–4.9)
Alkaline Phosphatase: 99 IU/L (ref 39–117)
Bilirubin Total: 0.2 mg/dL (ref 0.0–1.2)
Bilirubin, Direct: 0.1 mg/dL (ref 0.00–0.40)
Total Protein: 6.5 g/dL (ref 6.0–8.5)

## 2018-11-30 ENCOUNTER — Ambulatory Visit: Payer: Self-pay | Admitting: Rheumatology

## 2019-01-16 ENCOUNTER — Encounter: Payer: Self-pay | Admitting: Family Medicine

## 2019-01-26 NOTE — Progress Notes (Signed)
Office Visit Note  Patient: [name]             Date of Birth: [birth year]           MRN: [id number]             PCP: Girtha Rm, NP-C Referring: Girtha Rm, NP-C Visit Date: 01/31/2019 Occupation: _0 @  Subjective:  Right trochanteric bursitis   History of Present Illness: [name] is a 53 y.o. female with history of fibromyalgia, osteoarthritis, and DDD.  She continues have generalized muscle aches muscle tenderness due to fibromyalgia.  She has been having frequent fibromyalgia flares recently.  She states that her generalized pain is worse at night.  She presents today with trochanter bursitis of the right hip and right SI joint.  She requested cortisone injection today she continues to take Cymbalta 60 mg by mouth daily.  She has had interrupted sleep at night due to the discomfort she experiences.  She continues have chronic fatigue.  She states that on Friday she was walking HER-2 dogs and 1 of the leashes wrapped around her left ankle and she fell.  She was evaluated at urgent care on 01/28/2019 and had x-rays which did not reveal fracture.  She has been wearing a left ankle joint brace but continues to have difficulty bearing weight and has been using a cane to assist with ambulation.  She has tried taking Tylenol as needed for pain relief as well as icing and elevating the left ankle joint.  She states her left ankle remains swollen and has some mild bruising.    Activities of Daily Living:  Patient reports morning stiffness for 10 minutes.   Patient Reports nocturnal pain.  Difficulty dressing/grooming: Denies Difficulty climbing stairs: Reports Difficulty getting out of chair: Reports Difficulty using hands for taps, buttons, cutlery, and/or writing: Denies  Review of Systems  Constitutional: Positive for fatigue.  HENT: Negative for mouth sores, mouth dryness and nose dryness.   Eyes: Negative for pain, itching, visual disturbance and dryness.   Respiratory: Negative for cough, hemoptysis, shortness of breath, wheezing and difficulty breathing.   Cardiovascular: Negative for chest pain, palpitations, hypertension and swelling in legs/feet.  Gastrointestinal: Negative for abdominal pain, blood in stool, constipation and diarrhea.  Endocrine: Negative for increased urination.  Genitourinary: Negative for difficulty urinating and painful urination.  Musculoskeletal: Positive for arthralgias, joint pain, joint swelling and morning stiffness. Negative for myalgias, muscle weakness, muscle tenderness and myalgias.  Skin: Positive for rash. Negative for color change, pallor, hair loss, nodules/bumps, skin tightness, ulcers and sensitivity to sunlight.  Allergic/Immunologic: Negative for susceptible to infections.  Neurological: Negative for dizziness, light-headedness, headaches and memory loss.  Hematological: Negative for swollen glands.  Psychiatric/Behavioral: Positive for sleep disturbance. Negative for depressed mood and confusion. The patient is not nervous/anxious.     PMFS History:  Patient Active Problem List   Diagnosis Date Noted  . Elevated ALT measurement 10/12/2018  . GAD (generalized anxiety disorder) 04/08/2018  . Carpal tunnel syndrome, left upper limb 09/16/2017  . Carpal tunnel syndrome, right upper limb 09/16/2017  . History of migraine 08/24/2017  . Diabetes ([place])   . Vitamin D deficiency 01/08/2017  . Elevated LDL cholesterol level 08/10/2016  . Adenomatous polyps 07/13/2016  . Leg cramp 06/25/2015  . Primary osteoarthritis of both hands 06/25/2015  . Hip bursitis 06/25/2015  . 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 Chronic pain syndrome 06/25/2015  . Muscle pain, fibromyalgia 06/25/2015  . Fibromyalgia  06/17/2015    Past Medical History:  Diagnosis Date  . Arthritis   . Bursitis   . Carpal tunnel syndrome   . Chronic headaches   . Constipation   . Depression   . Diabetes ([place])    diet controlled, no meds   . Fibromyalgia   . GAD (generalized anxiety disorder) 04/08/2018  . Insomnia   . Snoring   . Tendonitis   . TMJ (dislocation of temporomandibular joint)     Family History  Problem Relation Age of Onset  . Hypertension Mother   . Glaucoma Mother   . Cataracts Mother   . Diabetes Father   . Hypertension Father   . Glaucoma Brother   . Ataxia Brother   . Fibromyalgia Daughter   . Migraines Daughter   . Migraines Daughter   . Asthma Daughter   . Colon cancer Neg Hx   . Esophageal cancer Neg Hx   . Pancreatic cancer Neg Hx   . Rectal cancer Neg Hx   . Stomach cancer Neg Hx    Past Surgical History:  Procedure Laterality Date  . ABDOMINAL HYSTERECTOMY    . CESAREAN SECTION     x3  . cyst from hand    . DENTAL SURGERY    . KNEE SURGERY     right  . TEMPOROMANDIBULAR JOINT SURGERY    . TONSILLECTOMY     Social History   Social History Narrative  . Not on file   Immunization History  Administered Date(s) Administered  . Tdap 05/11/2016     Objective: Vital Signs: BP 120/81 (BP Location: Left Arm, Patient Position: Sitting, Cuff Size: Normal)   Pulse 92   Resp 14   Ht 5' 3.5" (1.613 m)   Wt 191 lb 9.6 oz (86.9 kg)   BMI 33.41 kg/m    Physical Exam Vitals signs and nursing note reviewed.  Constitutional:      Appearance: She is well-developed.  HENT:     Head: Normocephalic and atraumatic.  Eyes:     Conjunctiva/sclera: Conjunctivae normal.  Neck:     Musculoskeletal: Normal range of motion.  Cardiovascular:     Rate and Rhythm: Normal rate and regular rhythm.     Heart sounds: Normal heart sounds.  Pulmonary:     Effort: Pulmonary effort is normal.     Breath sounds: Normal breath sounds.  Abdominal:     General: Bowel sounds are normal.     Palpations: Abdomen is soft.  Lymphadenopathy:     Cervical: No cervical adenopathy.  Skin:    General: Skin is warm and dry.     Capillary Refill: Capillary refill takes less than 2 seconds.  Neurological:      Mental Status: She is alert and oriented to person, place, and time.  Psychiatric:        Behavior: Behavior normal.      Musculoskeletal Exam: Generalized hyperalgesia and positive tender points. C-spine, thoracic spine, and lumbar spine good ROM.  Tenderness over the right SI joint and midline lumbar spine.  Shoulder joints, elbow joints, wrist joints, MCPs, PIPs, and DIPs good ROM with no synovitis.  Hip joints good ROM with no discomfort.  Tenderness over the right trochanteric bursa.  Knee joints good ROM with no warmth or effusion.  Bilateral knee crepitus.  Left ankle joint limited ROM with discomfort.  Diffuse swelling noted.  Right ankle joint good ROM with no tenderness or effusion.    CDAI Exam: CDAI Score: -  Patient Global: -; Provider Global: - Swollen: -; Tender: - Joint Exam   No joint exam has been documented for this visit   There is currently no information documented on the homunculus. Go to the Rheumatology activity and complete the homunculus joint exam.  Investigation: No additional findings.  Imaging: Dg Ankle Complete Left  Result Date: 01/28/2019 CLINICAL DATA:  Left ankle pain with swelling after falling yesterday. Pain more severe medial EXAM: LEFT ANKLE COMPLETE - 3+ VIEW COMPARISON:  None. FINDINGS: Mild regional soft tissue swelling. No visible joint effusion. No sign of acute fracture. There chronic corticated calcifications inferior to the medial malleolus consistent with old ligamentous injury. No visible acute fracture. IMPRESSION: Mild soft tissue swelling. No visible acute fracture by radiography. Chronic appearing corticated calcifications inferior to the medial malleolus consistent with old medial ligamentous injury. Electronically Signed   By: Nelson Chimes M.D.   On: 01/28/2019 09:38    Recent Labs: Lab Results  Component Value Date   WBC 6.4 10/10/2018   HGB 12.5 10/10/2018   PLT 427 10/10/2018   NA 141 10/10/2018   K 4.3 10/10/2018    CL 104 10/10/2018   CO2 22 10/10/2018   GLUCOSE 104 (H) 10/10/2018   BUN 15 10/10/2018   CREATININE 0.78 10/10/2018   BILITOT 0.2 11/09/2018   ALKPHOS 99 11/09/2018   AST 15 11/09/2018   ALT 20 11/09/2018   PROT 6.5 11/09/2018   ALBUMIN 4.4 11/09/2018   CALCIUM 9.5 10/10/2018   GFRAA 100 10/10/2018    Speciality Comments: No specialty comments available.  Procedures:  Large Joint Inj: R greater trochanter on 01/31/2019 11:32 AM Indications: pain Details: 27 G 1.5 in needle, lateral approach  Arthrogram: No  Medications: 40 mg triamcinolone acetonide 40 MG/ML; 1.5 mL lidocaine 1 % Aspirate: 0 mL Outcome: tolerated well, no immediate complications Procedure, treatment alternatives, risks and benefits explained, specific risks discussed. Consent was given by the patient. Immediately prior to procedure a time out was called to verify the correct patient, procedure, equipment, support staff and site/side marked as required. Patient was prepped and draped in the usual sterile fashion.   Sacroiliac Joint Inj on 01/31/2019 11:32 AM Indications: pain Details: 27 G 1.5 in needle, posterior approach Medications: 1 mL lidocaine 1 %; 40 mg triamcinolone acetonide 40 MG/ML Aspirate: 0 mL Outcome: tolerated well, no immediate complications Procedure, treatment alternatives, risks and benefits explained, specific risks discussed. Consent was given by the patient. Immediately prior to procedure a time out was called to verify the correct patient, procedure, equipment, support staff and site/side marked as required. Patient was prepped and draped in the usual sterile fashion.     Allergies: Amoxicillin, Doxycycline, and Penicillins   Assessment / Plan:     Visit Diagnoses: Fibromyalgia: She has generalized hyperalgesia and positive tender points on exam.  She has been having more frequent fibromyalgia flares recently.  She has generalized muscle aches muscle tenderness on exam.  She presents  today with right trochanteric bursitis.  She requested a cortisone injection.  She was given a handout of stretching exercises to perform.  She continues to take Cymbalta 60 mg by mouth daily.  She has interrupted sleep at night due to discomfort she experiences in the right trochanteric bursa and right SI joint.  Good sleep hygiene was discussed.  She has been experiencing increased muscle spasms and muscle cramps at bedtime.  We discussed trying magnesium malate 250 mg by mouth at bedtime for muscle cramps.  If she tolerates this dose she can increase to 500 mg by mouth at bedtime.  She will follow-up in the office in 6 months.  Trochanteric bursitis of right hip: She has tenderness over the right trochanteric  bursa.  She requested a right trochanter bursa cortisone injection.  She tolerated procedure well.  Procedure note was completed above.  She was given a handout of exercises to perform.  Chronic pain syndrome: She takes cymbalta 60 mg by mouth daily.   Other fatigue: She has chronic fatigue related to insomnia.   Other insomnia: She has interrupted sleep at night due to the discomfort she experiences.  Good sleep hygiene was discussed.   Primary osteoarthritis of both hands: She has mild PIP and DIP synovial thickening consistent with osteoarthritis of both hands.  No tenderness or synovitis noted.  Joint protection and muscle strengthening were discussed.   DDD (degenerative disc disease), lumbar: Chronic pain. She has midline spinal tenderness in the lumbar region. She was given a handout of back exercises.  She has no symptoms of radiculopathy at this time.   Chronic right SI joint pain: She presents today with right SI joint pain.  She has tenderness on exam today.  She requested a right SI joint cortisone injection.  Procedure note was completed above. Aftercare was discussed.   Acute left ankle pain: She fell on 01/28/2019 while walking her dogs.  According to the patient the leash of  1 of her dogs got wrapped around her left ankle and she fell.  She is evaluated at urgent care and had x-rays on 01/28/2019 which revealed mild soft tissue swelling.  No visible acute fracture was noted.  She has been wearing a left ankle joint brace which has been helping with the discomfort.  She is also been icing and elevating the left ankle.  She has tried taking Tylenol as needed for pain relief.  We discussed alternating Tylenol and ibuprofen as needed for pain relief.  She has been using a cane to assist with ambulation but she continues to have a lot of instability and difficulty bearing full weight.  She was given a prescription for a walker.  Other medical conditions are listed as follows:   Bilateral carpal tunnel syndrome  Adenomatous polyps  History of diabetes mellitus, type II  Vitamin D deficiency  History of depression  History of migraine  Orders: Orders Placed This Encounter  Procedures  . Large Joint Inj  . Sacroiliac Joint Inj   No orders of the defined types were placed in this encounter.   Face-to-face time spent with patient was 30 minutes. Greater than 50% of time was spent in counseling and coordination of care.  Follow-Up Instructions: Return in about 6 months (around 08/01/2019) for Fibromyalgia, Osteoarthritis, DDD.   Ofilia Neas, PA-C  Note - This record has been created using Dragon software.  Chart creation errors have been sought, but may not always  have been located. Such creation errors do not reflect on  the standard of medical care.

## 2019-01-28 ENCOUNTER — Ambulatory Visit (INDEPENDENT_AMBULATORY_CARE_PROVIDER_SITE_OTHER): Payer: 59

## 2019-01-28 ENCOUNTER — Other Ambulatory Visit: Payer: Self-pay

## 2019-01-28 ENCOUNTER — Ambulatory Visit
Admission: EM | Admit: 2019-01-28 | Discharge: 2019-01-28 | Disposition: A | Discharge status: Home / Self Care | Payer: 59

## 2019-01-28 DIAGNOSIS — S93402A Sprain of unspecified ligament of left ankle, initial encounter: Secondary | ICD-10-CM

## 2019-01-28 DIAGNOSIS — W1830XA Fall on same level, unspecified, initial encounter: Secondary | ICD-10-CM | POA: Diagnosis not present

## 2019-01-28 DIAGNOSIS — X501XXA Overexertion from prolonged static or awkward postures, initial encounter: Secondary | ICD-10-CM | POA: Diagnosis not present

## 2019-01-28 DIAGNOSIS — Y93K1 Activity, walking an animal: Secondary | ICD-10-CM | POA: Diagnosis not present

## 2019-01-28 NOTE — Discharge Instructions (Signed)
May ice, rest, elevate the area(s) of pain.   May use OTC Tylenol, ibuprofen as needed for pain. Perform ankle exercises provided 1-2 times daily. Return if you develop worsening pain, difficulty walking, persistent swelling.

## 2019-01-28 NOTE — ED Triage Notes (Signed)
Pt presents to UC stating she had an injury to left leg while taking her dogs out yesterday afternoon. She states she does not know if she twisted her left ankle but states it is painful in the inner ankle to flex left foot up or down. Left ankle is swollen.

## 2019-01-28 NOTE — ED Provider Notes (Signed)
[facility]    CSN: [id number] Arrival date & time: 01/28/19  [id number]      History   Chief Complaint Chief Complaint  Patient presents with  . left ankle injury    HPI [name] is a 53 y.o. female with history of diabetes, fibromyalgia presenting for left ankle pain and swelling that occurred after she got a chain twisted around her ankle, and she fell from ground-level when walking her dogs.  Patient states that incident happened so quickly that she is not really sure how she rolled her ankle, but has been having difficulty bearing weight since.  Patient has elevated, iced her ankle with moderate relief of pain.  Has not taken anything yet for pain.   Past Medical History:  Diagnosis Date  . Arthritis   . Bursitis   . Carpal tunnel syndrome   . Chronic headaches   . Constipation   . Depression   . Diabetes ([place])    diet controlled, no meds  . Fibromyalgia   . GAD (generalized anxiety disorder) 04/08/2018  . Insomnia   . Snoring   . Tendonitis   . TMJ (dislocation of temporomandibular joint)     Patient Active Problem List   Diagnosis Date Noted  . Elevated ALT measurement 10/12/2018  . GAD (generalized anxiety disorder) 04/08/2018  . Carpal tunnel syndrome, left upper limb 09/16/2017  . Carpal tunnel syndrome, right upper limb 09/16/2017  . History of migraine 08/24/2017  . Diabetes ([place])   . Vitamin D deficiency 01/08/2017  . Elevated LDL cholesterol level 08/10/2016  . Adenomatous polyps 07/13/2016  . Leg cramp 06/25/2015  . Primary osteoarthritis of both hands 06/25/2015  . Hip bursitis 06/25/2015  . 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 Chronic pain syndrome 06/25/2015  . Muscle pain, fibromyalgia 06/25/2015  . Fibromyalgia 06/17/2015    Past Surgical History:  Procedure Laterality Date  . ABDOMINAL HYSTERECTOMY    . CESAREAN SECTION     x3  . cyst from hand    . DENTAL SURGERY    . KNEE SURGERY     right  . TEMPOROMANDIBULAR JOINT  SURGERY    . TONSILLECTOMY      OB History   No obstetric history on file.      Home Medications    Prior to Admission medications   Medication Sig Start Date End Date Taking? Authorizing Provider  naproxen (NAPROSYN) 500 MG tablet Take 500 mg by mouth 2 (two) times daily with a meal.   Yes [provider]  Cholecalciferol (VITAMIN D PO) Take 2 tablets by mouth.    [provider]  CONTOUR NEXT TEST test strip TEST ONCE DAILY. PT USES CONTOUR NEXT METER 04/01/17   Henson, Vickie L, NP-C  DULoxetine (CYMBALTA) 60 MG capsule TAKE 1 CAPSULE BY MOUTH EVERY DAY 11/07/18   Henson, Vickie L, NP-C  losartan (COZAAR) 25 MG tablet Take 1 tablet (25 mg total) by mouth daily. 10/10/18   Henson, Laurian Brim, NP-C  ONETOUCH DELICA LANCETS FINE MISC Test once daily. Has one touch verio meter 03/24/17   Henson, Vickie L, NP-C  OZEMPIC, 0.25 OR 0.5 MG/DOSE, 2 MG/1.5ML SOPN INJECT 0.5 MG AS DIRECTED ONCE A WEEK. 10/17/18   Henson, Vickie L, NP-C  simvastatin (ZOCOR) 20 MG tablet TAKE 1 TABLET BY MOUTH EVERYDAY AT BEDTIME 09/26/18   Henson, Vickie L, NP-C  TURMERIC PO Take by mouth.    [provider]    Family History Family History  Problem Relation Age of Onset  . Hypertension Mother   . Glaucoma Mother   . Cataracts Mother   . Diabetes Father   . Hypertension Father   . Glaucoma Brother   . Ataxia Brother   . Fibromyalgia Daughter   . Migraines Daughter   . Migraines Daughter   . Asthma Daughter   . Colon cancer Neg Hx   . Esophageal cancer Neg Hx   . Pancreatic cancer Neg Hx   . Rectal cancer Neg Hx   . Stomach cancer Neg Hx     Social History Social History   Tobacco Use  . Smoking status: Never Smoker  . Smokeless tobacco: Never Used  Substance Use Topics  . Alcohol use: No  . Drug use: Not Currently     Allergies   Amoxicillin, Doxycycline, and Penicillins   Review of Systems Review of Systems  Constitutional: Negative for fatigue and fever.   Respiratory: Negative for cough and shortness of breath.   Cardiovascular: Negative for chest pain and palpitations.  Musculoskeletal:       Positive for left ankle pain, pain with ambulation/weightbearing  Neurological: Negative for weakness and numbness.     Physical Exam Triage Vital Signs ED Triage Vitals  Enc Vitals Group     BP 01/28/19 0845 138/72     Pulse Rate 01/28/19 0845 (!) 101     Resp 01/28/19 0845 16     Temp 01/28/19 0845 98.1 F (36.7 C)     Temp Source 01/28/19 0845 Oral     SpO2 01/28/19 0845 98 %     Weight --      Height --      Head Circumference --      Peak Flow --      Pain Score 01/28/19 0850 7     Pain Loc --      Pain Edu? --      Excl. in [place]? --    No data found.  Updated Vital Signs BP 138/72 (BP Location: Left Arm)   Pulse (!) 101   Temp 98.1 F (36.7 C) (Oral)   Resp 16   SpO2 98%   Visual Acuity Right Eye Distance:   Left Eye Distance:   Bilateral Distance:    Right Eye Near:   Left Eye Near:    Bilateral Near:     Physical Exam Constitutional:      General: She is not in acute distress. HENT:     Head: Normocephalic and atraumatic.  Eyes:     General: No scleral icterus.    Conjunctiva/sclera: Conjunctivae normal.     Pupils: Pupils are equal, round, and reactive to light.  Cardiovascular:     Rate and Rhythm: Normal rate.  Pulmonary:     Effort: Pulmonary effort is normal. No respiratory distress.  Musculoskeletal:     Comments: Left ankle mildly edematous as compared to right.  No ecchymosis, obvious deformity.  Decreased active ROM second to pain, specifically with dorsiflexion.  TTP over medial malleoli, posterior to malleoli.  Negative squeeze test.  All digits move without difficulty.  Strength deferred.  Patient antalgic, favoring left.  Skin:    General: Skin is warm.     Capillary Refill: Capillary refill takes less than 2 seconds.     Findings: No bruising or erythema.  Neurological:     General: No focal  deficit present.     Mental Status: She is alert.      UC  Treatments / Results  Labs (all labs ordered are listed, but only abnormal results are displayed) Labs Reviewed - No data to display  EKG   Radiology Dg Ankle Complete Left  Result Date: 01/28/2019 CLINICAL DATA:  Left ankle pain with swelling after falling yesterday. Pain more severe medial EXAM: LEFT ANKLE COMPLETE - 3+ VIEW COMPARISON:  None. FINDINGS: Mild regional soft tissue swelling. No visible joint effusion. No sign of acute fracture. There chronic corticated calcifications inferior to the medial malleolus consistent with old ligamentous injury. No visible acute fracture. IMPRESSION: Mild soft tissue swelling. No visible acute fracture by radiography. Chronic appearing corticated calcifications inferior to the medial malleolus consistent with old medial ligamentous injury. Electronically Signed   By: Nelson Chimes M.D.   On: 01/28/2019 09:38    Procedures Procedures (including critical care time)  Medications Ordered in UC Medications - No data to display  Initial Impression / Assessment and Plan / UC Course  I have reviewed the triage vital signs and the nursing notes.  Pertinent labs & imaging results that were available during my care of the patient were reviewed by me and considered in my medical decision making (see chart for details).     1.  Sprain of left ankle Due to unsure mechanism of injury with exquisite tenderness over medial malleoli, imaging was obtained.  This was reviewed by me and radiology: Chronic corticated calcifications inferior to medial malleolus consistent with old medial ligamentous injury was observed (patient does admit to previous injury "years ago "), mild soft tissue swelling without visible acute fracture.  Reviewed findings with patient who verbalized understanding.  Provided ASO brace in office which patient tolerated well.  Provided sports medicine contact information should  symptoms not improve over the next week.  Patient to bear weight as tolerated as there is no evidence of fracture at this time.  Return precautions discussed, patient verbalized understanding and is agreeable to plan. Final Clinical Impressions(s) / UC Diagnoses   Final diagnoses:  Sprain of left ankle, unspecified ligament, initial encounter     Discharge Instructions     May ice, rest, elevate the area(s) of pain.   May use OTC Tylenol, ibuprofen as needed for pain. Perform ankle exercises provided 1-2 times daily. Return if you develop worsening pain, difficulty walking, persistent swelling.    ED Prescriptions    None     PDMP not reviewed this encounter.   Hall[place], [place], [place] 01/28/19 1020

## 2019-01-31 ENCOUNTER — Encounter: Payer: Self-pay | Admitting: Physician Assistant

## 2019-01-31 ENCOUNTER — Ambulatory Visit (INDEPENDENT_AMBULATORY_CARE_PROVIDER_SITE_OTHER): Payer: 59 | Admitting: Physician Assistant

## 2019-01-31 ENCOUNTER — Other Ambulatory Visit: Payer: Self-pay

## 2019-01-31 VITALS — BP 120/81 | HR 92 | Resp 14 | Ht 63.5 in | Wt 191.6 lb

## 2019-01-31 DIAGNOSIS — M5136 Other intervertebral disc degeneration, lumbar region: Secondary | ICD-10-CM

## 2019-01-31 DIAGNOSIS — M19041 Primary osteoarthritis, right hand: Secondary | ICD-10-CM

## 2019-01-31 DIAGNOSIS — R5383 Other fatigue: Secondary | ICD-10-CM | POA: Diagnosis not present

## 2019-01-31 DIAGNOSIS — G8929 Other chronic pain: Secondary | ICD-10-CM

## 2019-01-31 DIAGNOSIS — M797 Fibromyalgia: Secondary | ICD-10-CM | POA: Diagnosis not present

## 2019-01-31 DIAGNOSIS — M7061 Trochanteric bursitis, right hip: Secondary | ICD-10-CM | POA: Diagnosis not present

## 2019-01-31 DIAGNOSIS — M25572 Pain in left ankle and joints of left foot: Secondary | ICD-10-CM

## 2019-01-31 DIAGNOSIS — G4709 Other insomnia: Secondary | ICD-10-CM

## 2019-01-31 DIAGNOSIS — D369 Benign neoplasm, unspecified site: Secondary | ICD-10-CM

## 2019-01-31 DIAGNOSIS — G5603 Carpal tunnel syndrome, bilateral upper limbs: Secondary | ICD-10-CM

## 2019-01-31 DIAGNOSIS — Z8659 Personal history of other mental and behavioral disorders: Secondary | ICD-10-CM

## 2019-01-31 DIAGNOSIS — M533 Sacrococcygeal disorders, not elsewhere classified: Secondary | ICD-10-CM

## 2019-01-31 DIAGNOSIS — M19042 Primary osteoarthritis, left hand: Secondary | ICD-10-CM

## 2019-01-31 DIAGNOSIS — Z8639 Personal history of other endocrine, nutritional and metabolic disease: Secondary | ICD-10-CM

## 2019-01-31 DIAGNOSIS — G894 Chronic pain syndrome: Secondary | ICD-10-CM | POA: Diagnosis not present

## 2019-01-31 DIAGNOSIS — Z8669 Personal history of other diseases of the nervous system and sense organs: Secondary | ICD-10-CM

## 2019-01-31 DIAGNOSIS — E559 Vitamin D deficiency, unspecified: Secondary | ICD-10-CM

## 2019-01-31 NOTE — Patient Instructions (Addendum)
Magnesium malate 250 mg at bedtime for muscle cramps  If you tolerate that dose you can increase to 500 mg at bedtime   May cause drowsiness and diarrhea    Back Exercises These exercises help to make your trunk and back strong. They also help to keep the lower back flexible. Doing these exercises can help to prevent back pain or lessen existing pain.  If you have back pain, try to do these exercises 2-3 times each day or as told by your doctor.  As you get better, do the exercises once each day. Repeat the exercises more often as told by your doctor.  To stop back pain from coming back, do the exercises once each day, or as told by your doctor. Exercises Single knee to chest Do these steps 3-5 times in a row for each leg: 1. Lie on your back on a firm bed or the floor with your legs stretched out. 2. Bring one knee to your chest. 3. Grab your knee or thigh with both hands and hold them it in place. 4. Pull on your knee until you feel a gentle stretch in your lower back or buttocks. 5. Keep doing the stretch for 10-30 seconds. 6. Slowly let go of your leg and straighten it. Pelvic tilt Do these steps 5-10 times in a row: 1. Lie on your back on a firm bed or the floor with your legs stretched out. 2. Bend your knees so they point up to the ceiling. Your feet should be flat on the floor. 3. Tighten your lower belly (abdomen) muscles to press your lower back against the floor. This will make your tailbone point up to the ceiling instead of pointing down to your feet or the floor. 4. Stay in this position for 5-10 seconds while you gently tighten your muscles and breathe evenly. Cat-cow Do these steps until your lower back bends more easily: 1. Get on your hands and knees on a firm surface. Keep your hands under your shoulders, and keep your knees under your hips. You may put padding under your knees. 2. Let your head hang down toward your chest. Tighten (contract) the muscles in your  belly. Point your tailbone toward the floor so your lower back becomes rounded like the back of a cat. 3. Stay in this position for 5 seconds. 4. Slowly lift your head. Let the muscles of your belly relax. Point your tailbone up toward the ceiling so your back forms a sagging arch like the back of a cow. 5. Stay in this position for 5 seconds.  Press-ups Do these steps 5-10 times in a row: 1. Lie on your belly (face-down) on the floor. 2. Place your hands near your head, about shoulder-width apart. 3. While you keep your back relaxed and keep your hips on the floor, slowly straighten your arms to raise the top half of your body and lift your shoulders. Do not use your back muscles. You may change where you place your hands in order to make yourself more comfortable. 4. Stay in this position for 5 seconds. 5. Slowly return to lying flat on the floor.  Bridges Do these steps 10 times in a row: 1. Lie on your back on a firm surface. 2. Bend your knees so they point up to the ceiling. Your feet should be flat on the floor. Your arms should be flat at your sides, next to your body. 3. Tighten your butt muscles and lift your butt off the  floor until your waist is almost as high as your knees. If you do not feel the muscles working in your butt and the back of your thighs, slide your feet 1-2 inches farther away from your butt. 4. Stay in this position for 3-5 seconds. 5. Slowly lower your butt to the floor, and let your butt muscles relax. If this exercise is too easy, try doing it with your arms crossed over your chest. Belly crunches Do these steps 5-10 times in a row: 1. Lie on your back on a firm bed or the floor with your legs stretched out. 2. Bend your knees so they point up to the ceiling. Your feet should be flat on the floor. 3. Cross your arms over your chest. 4. Tip your chin a little bit toward your chest but do not bend your neck. 5. Tighten your belly muscles and slowly raise your  chest just enough to lift your shoulder blades a tiny bit off of the floor. Avoid raising your body higher than that, because it can put too much stress on your low back. 6. Slowly lower your chest and your head to the floor. Back lifts Do these steps 5-10 times in a row: 1. Lie on your belly (face-down) with your arms at your sides, and rest your forehead on the floor. 2. Tighten the muscles in your legs and your butt. 3. Slowly lift your chest off of the floor while you keep your hips on the floor. Keep the back of your head in line with the curve in your back. Look at the floor while you do this. 4. Stay in this position for 3-5 seconds. 5. Slowly lower your chest and your face to the floor. Contact a doctor if:  Your back pain gets a lot worse when you do an exercise.  Your back pain does not get better 2 hours after you exercise. If you have any of these problems, stop doing the exercises. Do not do them again unless your doctor says it is okay. Get help right away if:  You have sudden, very bad back pain. If this happens, stop doing the exercises. Do not do them again unless your doctor says it is okay. This information is not intended to replace advice given to you by your health care provider. Make sure you discuss any questions you have with your health care provider. Document Released: 05/09/2010 Document Revised: 12/30/2017 Document Reviewed: 12/30/2017 Elsevier Patient Education  2020 [place]. Hip Bursitis Rehab Ask your health care provider which exercises are safe for you. Do exercises exactly as told by your health care provider and adjust them as directed. It is normal to feel mild stretching, pulling, tightness, or discomfort as you do these exercises. Stop right away if you feel sudden pain or your pain gets worse. Do not begin these exercises until told by your health care provider. Stretching exercise This exercise warms up your muscles and joints and improves the  movement and flexibility of your hip. This exercise also helps to relieve pain and stiffness. Iliotibial band stretch An iliotibial band is a strong band of muscle tissue that runs from the outer side of your hip to the outer side of your thigh and knee. 1. Lie on your side with your left / right leg in the top position. 2. Bend your left / right knee and grab your ankle. Stretch out your bottom arm to help you balance. 3. Slowly bring your knee back so your thigh  is behind your body. 4. Slowly lower your knee toward the floor until you feel a gentle stretch on the outside of your left / right thigh. If you do not feel a stretch and your knee will not fall farther, place the heel of your other foot on top of your knee and pull your knee down toward the floor with your foot. 5. Hold this position for __________ seconds. 6. Slowly return to the starting position. Repeat __________ times. Complete this exercise __________ times a day. Strengthening exercises These exercises build strength and endurance in your hip and pelvis. Endurance is the ability to use your muscles for a long time, even after they get tired. Bridge This exercise strengthens the muscles that move your thigh backward (hip extensors). 1. Lie on your back on a firm surface with your knees bent and your feet flat on the floor. 2. Tighten your buttocks muscles and lift your buttocks off the floor until your trunk is level with your thighs. ? Do not arch your back. ? You should feel the muscles working in your buttocks and the back of your thighs. If you do not feel these muscles, slide your feet 1-2 inches (2.5-5 cm) farther away from your buttocks. ? If this exercise is too easy, try doing it with your arms crossed over your chest. 3. Hold this position for __________ seconds. 4. Slowly lower your hips to the starting position. 5. Let your muscles relax completely after each repetition. Repeat __________ times. Complete this  exercise __________ times a day. Squats This exercise strengthens the muscles in front of your thigh and knee (quadriceps). 1. Stand in front of a table, with your feet and knees pointing straight ahead. You may rest your hands on the table for balance but not for support. 2. Slowly bend your knees and lower your hips like you are going to sit in a chair. ? Keep your weight over your heels, not over your toes. ? Keep your lower legs upright so they are parallel with the table legs. ? Do not let your hips go lower than your knees. ? Do not bend lower than told by your health care provider. ? If your hip pain increases, do not bend as low. 3. Hold the squat position for __________ seconds. 4. Slowly push with your legs to return to standing. Do not use your hands to pull yourself to standing. Repeat __________ times. Complete this exercise __________ times a day. Hip hike 6. Stand sideways on a bottom step. Stand on your left / right leg with your other foot unsupported next to the step. You can hold on to the railing or wall for balance if needed. 7. Keep your knees straight and your torso square. Then lift your left / right hip up toward the ceiling. 8. Hold this position for __________ seconds. 9. Slowly let your left / right hip lower toward the floor, past the starting position. Your foot should get closer to the floor. Do not lean or bend your knees. Repeat __________ times. Complete this exercise __________ times a day. Single leg stand 1. Without shoes, stand near a railing or in a doorway. You may hold on to the railing or door frame as needed for balance. 2. Squeeze your left / right buttock muscles, then lift up your other foot. ? Do not let your left / right hip push out to the side. ? It is helpful to stand in front of a mirror for this exercise so  you can watch your hip. 3. Hold this position for __________ seconds. Repeat __________ times. Complete this exercise __________ times  a day. This information is not intended to replace advice given to you by your health care provider. Make sure you discuss any questions you have with your health care provider. Document Released: 05/14/2004 Document Revised: 08/01/2018 Document Reviewed: 08/01/2018 Elsevier Patient Education  2020 [employer].

## 2019-03-06 ENCOUNTER — Encounter: Payer: Self-pay | Admitting: Family Medicine

## 2019-03-06 ENCOUNTER — Other Ambulatory Visit: Payer: Self-pay | Admitting: Internal Medicine

## 2019-03-06 DIAGNOSIS — E119 Type 2 diabetes mellitus without complications: Secondary | ICD-10-CM

## 2019-03-06 MED ORDER — OZEMPIC (0.25 OR 0.5 MG/DOSE) 2 MG/1.5ML ~~LOC~~ SOPN: 0.5000 mg | PEN_INJECTOR | SUBCUTANEOUS | 1 refills | Status: DC

## 2019-03-15 ENCOUNTER — Telehealth: Payer: Self-pay | Admitting: Family Medicine

## 2019-03-15 NOTE — Telephone Encounter (Signed)
P.[employer]. [place] ID# [id number]

## 2019-03-15 NOTE — Telephone Encounter (Signed)
P.A. approved, activated discount card, went thru for $24.99. Pt informed

## 2019-03-28 ENCOUNTER — Ambulatory Visit: Payer: 59 | Admitting: Physician Assistant

## 2019-04-04 ENCOUNTER — Other Ambulatory Visit: Payer: Self-pay | Admitting: Family Medicine

## 2019-04-04 DIAGNOSIS — E119 Type 2 diabetes mellitus without complications: Secondary | ICD-10-CM

## 2019-04-08 ENCOUNTER — Other Ambulatory Visit: Payer: Self-pay | Admitting: Family Medicine

## 2019-04-11 ENCOUNTER — Encounter: Payer: Self-pay | Admitting: Family Medicine

## 2019-04-11 ENCOUNTER — Other Ambulatory Visit: Payer: Self-pay

## 2019-04-11 ENCOUNTER — Other Ambulatory Visit: Payer: Self-pay | Admitting: Family Medicine

## 2019-04-11 ENCOUNTER — Ambulatory Visit: Payer: BC Managed Care – PPO | Admitting: Family Medicine

## 2019-04-11 VITALS — BP 120/72 | HR 98 | Temp 97.7°F | Wt 181.0 lb

## 2019-04-11 DIAGNOSIS — M25572 Pain in left ankle and joints of left foot: Secondary | ICD-10-CM | POA: Diagnosis not present

## 2019-04-11 DIAGNOSIS — R82998 Other abnormal findings in urine: Secondary | ICD-10-CM | POA: Diagnosis not present

## 2019-04-11 DIAGNOSIS — R300 Dysuria: Secondary | ICD-10-CM | POA: Diagnosis not present

## 2019-04-11 DIAGNOSIS — N3000 Acute cystitis without hematuria: Secondary | ICD-10-CM

## 2019-04-11 DIAGNOSIS — E119 Type 2 diabetes mellitus without complications: Secondary | ICD-10-CM | POA: Diagnosis not present

## 2019-04-11 DIAGNOSIS — R8290 Unspecified abnormal findings in urine: Secondary | ICD-10-CM

## 2019-04-11 DIAGNOSIS — R21 Rash and other nonspecific skin eruption: Secondary | ICD-10-CM

## 2019-04-11 DIAGNOSIS — Z1231 Encounter for screening mammogram for malignant neoplasm of breast: Secondary | ICD-10-CM

## 2019-04-11 LAB — POCT URINALYSIS DIP (PROADVANTAGE DEVICE)
Bilirubin, UA: NEGATIVE
Blood, UA: NEGATIVE
Glucose, UA: NEGATIVE mg/dL
Ketones, POC UA: NEGATIVE mg/dL
Nitrite, UA: NEGATIVE
Protein Ur, POC: NEGATIVE mg/dL
Specific Gravity, Urine: 1.025
Urobilinogen, Ur: NEGATIVE
pH, UA: 6 (ref 5.0–8.0)

## 2019-04-11 LAB — POCT GLYCOSYLATED HEMOGLOBIN (HGB A1C): Hemoglobin A1C: 5.8 % — AB (ref 4.0–5.6)

## 2019-04-11 MED ORDER — SULFAMETHOXAZOLE-TRIMETHOPRIM 800-160 MG PO TABS: 1.0000 | ORAL_TABLET | Freq: Two times a day (BID) | ORAL | 0 refills | Status: DC

## 2019-04-11 NOTE — Addendum Note (Signed)
Addended by: Minette Headland A on: 04/11/2019 02:41 PM   Modules accepted: Orders

## 2019-04-11 NOTE — Progress Notes (Signed)
Subjective:    Patient ID: [name], female    DOB: [birth year], 53 y.o.   MRN: [id number]  [name] is a 53 y.o. female who presents for follow-up of Type 2 diabetes mellitus.  She also complains of painful urination, suprapubic pressure, urinary frequency and malodorous urine for the past 2 weeks.  States this feels like a UTI.  She denies a history of recurrent UTIs.  Denies pyelonephritis.  She fell in October, tripping over her dogs, has an injury to her posterior tibia and is in a walking boot.   Complains of brown spots on the soles of her feet for past month.  Denies any itching, pain, burning or numbness.  States she has been trying to lose weight and has lost 10 lbs by improving her diet. States her clothes are lose now. Feeling good about this.    Patient is checking home blood sugars.   Home blood sugar records: BGs range between 98 and 130 How often is blood sugars being checked: every 2 weeks Current symptoms include: none. Patient denies increased appetite, nausea, paresthesia of the feet, visual disturbances, vomiting and weight loss.  Patient is checking their feet daily. Any Foot concerns (callous, ulcer, wound, thickened nails, toenail fungus, skin fungus, hammer toe): spots on feet Last dilated eye exam: 11/05/2018 recent eye exam showed no retinopathy.  Current treatments: doing well on dm meds. Medication compliance: good  Current diet: in general, a "healthy" diet   decreased some foods such as pasta and french fries Current exercise: walking Known diabetic complications: none   The following portions of the patient's history were reviewed and updated as appropriate: allergies, current medications, past medical history, past social history and problem list.  ROS as in subjective above.     Objective:    Physical Exam Alert and in no distress. Left foot with a IT trainer on. Right foot with several flat dark spots that are non-pruritic, non tender, she  has normal sensation, ROM. Nails are normal appearing. No LE edema.   Blood pressure 120/72, pulse 98, temperature 97.7 F (36.5 C), weight 181 lb (82.1 kg).  Lab Review Diabetic Labs Latest Ref Rng & Units 04/11/2019 10/10/2018 04/08/2018 10/07/2017 05/21/2017  HbA1c 4.0 - 5.6 % 5.8(A) 6.4(A) 6.0(A) 6.1(A) 6.0%  Microalbumin Not estab mg/dL - - - - -  Micro/Creat Ratio 0.0 - 30.0 mg/g creat - - - 4.0 -  Chol 100 - 199 mg/dL - 197 - - 162  HDL >39 mg/dL - 57 - - 58  Calc LDL 0 - 99 mg/dL - 112(H) - - 89  Triglycerides 0 - 149 mg/dL - 142 - - 74  Creatinine 0.57 - 1.00 mg/dL - 0.78 0.81 - 0.96   BP/Weight 04/11/2019 01/31/2019 01/28/2019 [id number]  Systolic BP 123456 123456 0000000 [id number]  Diastolic BP 72 81 72 82 68  Wt. (Lbs) 181 191.6 - 186 189  BMI 31.56 33.41 - 30.95 33.48   Foot/eye exam completion dates Latest Ref Rng & Units 11/05/2018 04/08/2018  Eye Exam No Retinopathy No Retinopathy -  Foot Form Completion - - Done    [name]  reports that she has never smoked. She has never used smokeless tobacco. She reports that she does not drink alcohol or use drugs.     Assessment & Plan:    Type 2 diabetes mellitus without complication, without long-term current use of insulin ([place]) - Plan: HgB A1c, Microalbumin/Creatinine Ratio, Urine  Dysuria - Plan: Urine culture  Leukocytes in urine - Plan: Urine culture  Malodorous urine  Acute cystitis without hematuria - Plan: sulfamethoxazole-trimethoprim (BACTRIM DS) 800-160 MG tablet, Urine culture  Skin eruption  1. Rx changes: none Hgb A1c 5.8% which is improved.  2. Education: Reviewed 'ABCs' of diabetes management (respective goals in parentheses):  A1C (<7), blood pressure (<130/80), and cholesterol (LDL <100). 3. Compliance at present is estimated to be good. Efforts to improve compliance (if necessary) will be directed at dietary modifications: continue with low carb diet and increased exercise. 4. UTI- will treat with  Bactrim. Urine culture ordered. She will follow up if not back to baseline or if symptoms worsen.  5. Discussed that I am not sure what the dark spots on the soles of her feet are and Dr. Redmond School also examined patient. She does not have any pain or itching with these. None on palms. Refer to dermatology.  6. Follow up: 4 months

## 2019-04-11 NOTE — Patient Instructions (Signed)
Dermatology offices  [facility]: Phone #: [phone] Address: [address], [place], [place] [address]  [facility]: Phone: [phone]  Address: [address] [place], [name] [address]  [facility] Address: [address] [place], [place], [place] [address] Phone: [phone]

## 2019-04-12 LAB — URINE CULTURE

## 2019-04-12 LAB — MICROALBUMIN / CREATININE URINE RATIO
Creatinine, Urine: 142.2 mg/dL
Microalb/Creat Ratio: 4 mg/g creat (ref 0–29)
Microalbumin, Urine: 6 ug/mL

## 2019-04-19 DIAGNOSIS — M25572 Pain in left ankle and joints of left foot: Secondary | ICD-10-CM | POA: Diagnosis not present

## 2019-04-20 ENCOUNTER — Other Ambulatory Visit: Payer: Self-pay

## 2019-04-20 ENCOUNTER — Ambulatory Visit
Admission: RE | Admit: 2019-04-20 | Discharge: 2019-04-20 | Disposition: A | Discharge status: Home / Self Care | Payer: BC Managed Care – PPO | Source: Ambulatory Visit

## 2019-04-20 DIAGNOSIS — Z1231 Encounter for screening mammogram for malignant neoplasm of breast: Secondary | ICD-10-CM | POA: Diagnosis not present

## 2019-04-24 DIAGNOSIS — M25572 Pain in left ankle and joints of left foot: Secondary | ICD-10-CM | POA: Diagnosis not present

## 2019-04-27 ENCOUNTER — Other Ambulatory Visit: Payer: BC Managed Care – PPO

## 2019-04-27 DIAGNOSIS — Z6832 Body mass index (BMI) 32.0-32.9, adult: Secondary | ICD-10-CM | POA: Diagnosis not present

## 2019-04-27 DIAGNOSIS — Z20822 Contact with and (suspected) exposure to covid-19: Secondary | ICD-10-CM | POA: Diagnosis not present

## 2019-05-08 DIAGNOSIS — M25561 Pain in right knee: Secondary | ICD-10-CM | POA: Diagnosis not present

## 2019-05-10 ENCOUNTER — Other Ambulatory Visit: Payer: Self-pay | Admitting: Family Medicine

## 2019-05-10 NOTE — Telephone Encounter (Signed)
Is this okay to refill? 

## 2019-06-14 ENCOUNTER — Encounter: Payer: Self-pay | Admitting: Family Medicine

## 2019-06-17 ENCOUNTER — Ambulatory Visit: Payer: BC Managed Care – PPO | Attending: Internal Medicine

## 2019-06-17 DIAGNOSIS — Z23 Encounter for immunization: Secondary | ICD-10-CM | POA: Insufficient documentation

## 2019-06-17 NOTE — Progress Notes (Signed)
   Covid-19 Vaccination Clinic  Name:  Jarianna Hults    MRN: [id number] DOB: 12/06/1965  06/17/2019  Ms. Barredo was observed post Covid-19 immunization for 15 minutes without incidence. She was provided with Vaccine Information Sheet and instruction to access the V-Safe system.   Ms. Sama was instructed to call 911 with any severe reactions post vaccine: Marland Kitchen Difficulty breathing  . Swelling of your face and throat  . A fast heartbeat  . A bad rash all over your body  . Dizziness and weakness    Immunizations Administered    Name Date Dose VIS Date Route   Pfizer COVID-19 Vaccine 06/17/2019 10:09 AM 0.3 mL 03/31/2019 Intramuscular   Manufacturer: [place]   Lot: [id number]   [place]: [id number]

## 2019-07-08 ENCOUNTER — Ambulatory Visit: Payer: BC Managed Care – PPO | Attending: Internal Medicine

## 2019-07-08 DIAGNOSIS — Z23 Encounter for immunization: Secondary | ICD-10-CM

## 2019-07-08 NOTE — Progress Notes (Signed)
   Covid-19 Vaccination Clinic  Name:  Renesha Mapson    MRN: [id number] DOB: June 14, 1965  07/08/2019  Ms. Monsma was observed post Covid-19 immunization for 30 minutes based on pre-vaccination screening without incident. She was provided with Vaccine Information Sheet and instruction to access the V-Safe system.   Ms. Laslie was instructed to call 911 with any severe reactions post vaccine: Marland Kitchen Difficulty breathing  . Swelling of face and throat  . A fast heartbeat  . A bad rash all over body  . Dizziness and weakness   Immunizations Administered    Name Date Dose VIS Date Route   Pfizer COVID-19 Vaccine 07/08/2019  2:21 PM 0.3 mL 03/31/2019 Intramuscular   Manufacturer: [place]   Lot: [id number]   [place]: [id number]

## 2019-07-10 ENCOUNTER — Other Ambulatory Visit: Payer: Self-pay | Admitting: Family Medicine

## 2019-07-10 DIAGNOSIS — E119 Type 2 diabetes mellitus without complications: Secondary | ICD-10-CM

## 2019-07-11 NOTE — Telephone Encounter (Signed)
Has an appt at end of april

## 2019-07-12 ENCOUNTER — Ambulatory Visit: Payer: BC Managed Care – PPO

## 2019-07-18 DIAGNOSIS — Z0289 Encounter for other administrative examinations: Secondary | ICD-10-CM

## 2019-07-27 NOTE — Progress Notes (Deleted)
Office Visit Note  Patient: [name]             Date of Birth: 08-27-1965           MRN: [id number]             PCP: Girtha Rm, NP-C Referring: Girtha Rm, NP-C Visit Date: 08/01/2019 Occupation: @GUAROCC @  Subjective:  No chief complaint on file.   History of Present Illness: [name] is a 54 y.o. female ***   Activities of Daily Living:  Patient reports morning stiffness for *** {minute/hour:19697}.   Patient {ACTIONS;DENIES/REPORTS:21021675::"Denies"} nocturnal pain.  Difficulty dressing/grooming: {ACTIONS;DENIES/REPORTS:21021675::"Denies"} Difficulty climbing stairs: {ACTIONS;DENIES/REPORTS:21021675::"Denies"} Difficulty getting out of chair: {ACTIONS;DENIES/REPORTS:21021675::"Denies"} Difficulty using hands for taps, buttons, cutlery, and/or writing: {ACTIONS;DENIES/REPORTS:21021675::"Denies"}  No Rheumatology ROS completed.   PMFS History:  Patient Active Problem List   Diagnosis Date Noted  . Elevated ALT measurement 10/12/2018  . GAD (generalized anxiety disorder) 04/08/2018  . Carpal tunnel syndrome, left upper limb 09/16/2017  . Carpal tunnel syndrome, right upper limb 09/16/2017  . History of migraine 08/24/2017  . Diabetes ([place])   . Vitamin D deficiency 01/08/2017  . Elevated LDL cholesterol level 08/10/2016  . Adenomatous polyps 07/13/2016  . Leg cramp 06/25/2015  . Primary osteoarthritis of both hands 06/25/2015  . Hip bursitis 06/25/2015  . 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 Chronic pain syndrome 06/25/2015  . Muscle pain, fibromyalgia 06/25/2015  . Fibromyalgia 06/17/2015    Past Medical History:  Diagnosis Date  . Arthritis   . Bursitis   . Carpal tunnel syndrome   . Chronic headaches   . Constipation   . Depression   . Diabetes ([place])    diet controlled, no meds  . Fibromyalgia   . GAD (generalized anxiety disorder) 04/08/2018  . Insomnia   . Snoring   . Tendonitis   . TMJ (dislocation of  temporomandibular joint)     Family History  Problem Relation Age of Onset  . Hypertension Mother   . Glaucoma Mother   . Cataracts Mother   . Diabetes Father   . Hypertension Father   . Glaucoma Brother   . Ataxia Brother   . Fibromyalgia Daughter   . Migraines Daughter   . Migraines Daughter   . Asthma Daughter   . Breast cancer Maternal Grandfather   . Colon cancer Neg Hx   . Esophageal cancer Neg Hx   . Pancreatic cancer Neg Hx   . Rectal cancer Neg Hx   . Stomach cancer Neg Hx    Past Surgical History:  Procedure Laterality Date  . ABDOMINAL HYSTERECTOMY    . CESAREAN SECTION     x3  . cyst from hand    . DENTAL SURGERY    . KNEE SURGERY     right  . TEMPOROMANDIBULAR JOINT SURGERY    . TONSILLECTOMY     Social History   Social History Narrative  . Not on file   Immunization History  Administered Date(s) Administered  . PFIZER SARS-COV-2 Vaccination 06/17/2019, 07/08/2019  . Tdap 05/11/2016     Objective: Vital Signs: There were no vitals taken for this visit.   Physical Exam   Musculoskeletal Exam: ***  CDAI Exam: CDAI Score: - Patient Global: -; Provider Global: - Swollen: -; Tender: - Joint Exam 08/01/2019   No joint exam has been documented for this visit   There is currently no information documented on the homunculus. Go to the Rheumatology activity  and complete the homunculus joint exam.  Investigation: No additional findings.  Imaging: No results found.  Recent Labs: Lab Results  Component Value Date   WBC 6.4 10/10/2018   HGB 12.5 10/10/2018   PLT 427 10/10/2018   NA 141 10/10/2018   K 4.3 10/10/2018   CL 104 10/10/2018   CO2 22 10/10/2018   GLUCOSE 104 (H) 10/10/2018   BUN 15 10/10/2018   CREATININE 0.78 10/10/2018   BILITOT 0.2 11/09/2018   ALKPHOS 99 11/09/2018   AST 15 11/09/2018   ALT 20 11/09/2018   PROT 6.5 11/09/2018   ALBUMIN 4.4 11/09/2018   CALCIUM 9.5 10/10/2018   GFRAA 100 10/10/2018    Speciality  Comments: No specialty comments available.  Procedures:  No procedures performed Allergies: Amoxicillin, Doxycycline, and Penicillins   Assessment / Plan:     Visit Diagnoses: No diagnosis found.  Orders: No orders of the defined types were placed in this encounter.  No orders of the defined types were placed in this encounter.   Face-to-face time spent with patient was *** minutes. Greater than 50% of time was spent in counseling and coordination of care.  Follow-Up Instructions: No follow-ups on file.   Ofilia Neas, PA-C  Note - This record has been created using Dragon software.  Chart creation errors have been sought, but may not always  have been located. Such creation errors do not reflect on  the standard of medical care.

## 2019-07-31 ENCOUNTER — Ambulatory Visit: Payer: Self-pay

## 2019-07-31 ENCOUNTER — Other Ambulatory Visit: Payer: Self-pay

## 2019-07-31 ENCOUNTER — Ambulatory Visit: Payer: BC Managed Care – PPO | Admitting: Physician Assistant

## 2019-07-31 ENCOUNTER — Encounter: Payer: Self-pay | Admitting: Physician Assistant

## 2019-07-31 VITALS — BP 114/69 | HR 91 | Resp 16 | Ht 63.5 in | Wt 190.6 lb

## 2019-07-31 DIAGNOSIS — M51379 Other intervertebral disc degeneration, lumbosacral region without mention of lumbar back pain or lower extremity pain: Secondary | ICD-10-CM

## 2019-07-31 DIAGNOSIS — M5137 Other intervertebral disc degeneration, lumbosacral region: Secondary | ICD-10-CM

## 2019-07-31 DIAGNOSIS — M25511 Pain in right shoulder: Secondary | ICD-10-CM | POA: Diagnosis not present

## 2019-07-31 DIAGNOSIS — D369 Benign neoplasm, unspecified site: Secondary | ICD-10-CM

## 2019-07-31 DIAGNOSIS — M533 Sacrococcygeal disorders, not elsewhere classified: Secondary | ICD-10-CM

## 2019-07-31 DIAGNOSIS — Z8639 Personal history of other endocrine, nutritional and metabolic disease: Secondary | ICD-10-CM

## 2019-07-31 DIAGNOSIS — G4709 Other insomnia: Secondary | ICD-10-CM

## 2019-07-31 DIAGNOSIS — M797 Fibromyalgia: Secondary | ICD-10-CM

## 2019-07-31 DIAGNOSIS — Z8659 Personal history of other mental and behavioral disorders: Secondary | ICD-10-CM

## 2019-07-31 DIAGNOSIS — M19042 Primary osteoarthritis, left hand: Secondary | ICD-10-CM

## 2019-07-31 DIAGNOSIS — G894 Chronic pain syndrome: Secondary | ICD-10-CM

## 2019-07-31 DIAGNOSIS — M7061 Trochanteric bursitis, right hip: Secondary | ICD-10-CM | POA: Diagnosis not present

## 2019-07-31 DIAGNOSIS — Z8669 Personal history of other diseases of the nervous system and sense organs: Secondary | ICD-10-CM

## 2019-07-31 DIAGNOSIS — G8929 Other chronic pain: Secondary | ICD-10-CM | POA: Diagnosis not present

## 2019-07-31 DIAGNOSIS — R5383 Other fatigue: Secondary | ICD-10-CM

## 2019-07-31 DIAGNOSIS — G5603 Carpal tunnel syndrome, bilateral upper limbs: Secondary | ICD-10-CM

## 2019-07-31 DIAGNOSIS — E559 Vitamin D deficiency, unspecified: Secondary | ICD-10-CM

## 2019-07-31 DIAGNOSIS — M25561 Pain in right knee: Secondary | ICD-10-CM | POA: Diagnosis not present

## 2019-07-31 DIAGNOSIS — M19041 Primary osteoarthritis, right hand: Secondary | ICD-10-CM

## 2019-07-31 NOTE — Patient Instructions (Signed)
Hip Bursitis Rehab Ask your health care provider which exercises are safe for you. Do exercises exactly as told by your health care provider and adjust them as directed. It is normal to feel mild stretching, pulling, tightness, or discomfort as you do these exercises. Stop right away if you feel sudden pain or your pain gets worse. Do not begin these exercises until told by your health care provider. Stretching exercise This exercise warms up your muscles and joints and improves the movement and flexibility of your hip. This exercise also helps to relieve pain and stiffness. Iliotibial band stretch An iliotibial band is a strong band of muscle tissue that runs from the outer side of your hip to the outer side of your thigh and knee. 1. Lie on your side with your left / right leg in the top position. 2. Bend your left / right knee and grab your ankle. Stretch out your bottom arm to help you balance. 3. Slowly bring your knee back so your thigh is behind your body. 4. Slowly lower your knee toward the floor until you feel a gentle stretch on the outside of your left / right thigh. If you do not feel a stretch and your knee will not fall farther, place the heel of your other foot on top of your knee and pull your knee down toward the floor with your foot. 5. Hold this position for __________ seconds. 6. Slowly return to the starting position. Repeat __________ times. Complete this exercise __________ times a day. Strengthening exercises These exercises build strength and endurance in your hip and pelvis. Endurance is the ability to use your muscles for a long time, even after they get tired. Bridge This exercise strengthens the muscles that move your thigh backward (hip extensors). 1. Lie on your back on a firm surface with your knees bent and your feet flat on the floor. 2. Tighten your buttocks muscles and lift your buttocks off the floor until your trunk is level with your thighs. ? Do not arch  your back. ? You should feel the muscles working in your buttocks and the back of your thighs. If you do not feel these muscles, slide your feet 1-2 inches (2.5-5 cm) farther away from your buttocks. ? If this exercise is too easy, try doing it with your arms crossed over your chest. 3. Hold this position for __________ seconds. 4. Slowly lower your hips to the starting position. 5. Let your muscles relax completely after each repetition. Repeat __________ times. Complete this exercise __________ times a day. Squats This exercise strengthens the muscles in front of your thigh and knee (quadriceps). 1. Stand in front of a table, with your feet and knees pointing straight ahead. You may rest your hands on the table for balance but not for support. 2. Slowly bend your knees and lower your hips like you are going to sit in a chair. ? Keep your weight over your heels, not over your toes. ? Keep your lower legs upright so they are parallel with the table legs. ? Do not let your hips go lower than your knees. ? Do not bend lower than told by your health care provider. ? If your hip pain increases, do not bend as low. 3. Hold the squat position for __________ seconds. 4. Slowly push with your legs to return to standing. Do not use your hands to pull yourself to standing. Repeat __________ times. Complete this exercise __________ times a day. Hip hike 1. Stand   sideways on a bottom step. Stand on your left / right leg with your other foot unsupported next to the step. You can hold on to the railing or wall for balance if needed. 2. Keep your knees straight and your torso square. Then lift your left / right hip up toward the ceiling. 3. Hold this position for __________ seconds. 4. Slowly let your left / right hip lower toward the floor, past the starting position. Your foot should get closer to the floor. Do not lean or bend your knees. Repeat __________ times. Complete this exercise __________ times a  day. Single leg stand 1. Without shoes, stand near a railing or in a doorway. You may hold on to the railing or door frame as needed for balance. 2. Squeeze your left / right buttock muscles, then lift up your other foot. ? Do not let your left / right hip push out to the side. ? It is helpful to stand in front of a mirror for this exercise so you can watch your hip. 3. Hold this position for __________ seconds. Repeat __________ times. Complete this exercise __________ times a day. This information is not intended to replace advice given to you by your health care provider. Make sure you discuss any questions you have with your health care provider. Document Revised: 08/01/2018 Document Reviewed: 08/01/2018 Else[employer] Patient Education  2020 [place] [employer], 15(4), [phone]. Retrieved January 24, 2018 from [email or web address].com/[email or web address]">  Knee Exercises Ask your health care provider which exercises are safe for you. Do exercises exactly as told by your health care provider and adjust them as directed. It is normal to feel mild stretching, pulling, tightness, or discomfort as you do these exercises. Stop right away if you feel sudden pain or your pain gets worse. Do not begin these exercises until told by your health care provider. Stretching and range-of-motion exercises These exercises warm up your muscles and joints and improve the movement and flexibility of your knee. These exercises also help to relieve pain and swelling. Knee extension, prone 1. Lie on your abdomen (prone position) on a bed. 2. Place your left / right knee just beyond the edge of the surface so your knee is not on the bed. You can put a towel under your left / right thigh just above your kneecap for comfort. 3. Relax your leg muscles and allow gravity to straighten your knee (extension). You should feel a stretch behind your left / right knee. 4. Hold this position for __________  seconds. 5. Scoot up so your knee is supported between repetitions. Repeat __________ times. Complete this exercise __________ times a day. Knee flexion, active  1. Lie on your back with both legs straight. If this causes back discomfort, bend your left / right knee so your foot is flat on the floor. 2. Slowly slide your left / right heel back toward your buttocks. Stop when you feel a gentle stretch in the front of your knee or thigh (flexion). 3. Hold this position for __________ seconds. 4. Slowly slide your left / right heel back to the starting position. Repeat __________ times. Complete this exercise __________ times a day. Quadriceps stretch, prone  1. Lie on your abdomen on a firm surface, such as a bed or padded floor. 2. Bend your left / right knee and hold your ankle. If you cannot reach your ankle or pant leg, loop a belt around your foot and grab the belt instead. 3. Gently pull your heel  toward your buttocks. Your knee should not slide out to the side. You should feel a stretch in the front of your thigh and knee (quadriceps). 4. Hold this position for __________ seconds. Repeat __________ times. Complete this exercise __________ times a day. Hamstring, supine 1. Lie on your back (supine position). 2. Loop a belt or towel over the ball of your left / right foot. The ball of your foot is on the walking surface, right under your toes. 3. Straighten your left / right knee and slowly pull on the belt to raise your leg until you feel a gentle stretch behind your knee (hamstring). ? Do not let your knee bend while you do this. ? Keep your other leg flat on the floor. 4. Hold this position for __________ seconds. Repeat __________ times. Complete this exercise __________ times a day. Strengthening exercises These exercises build strength and endurance in your knee. Endurance is the ability to use your muscles for a long time, even after they get tired. Quadriceps, isometric This  exercise stretches the muscles in front of your thigh (quadriceps) without moving your knee joint (isometric). 1. Lie on your back with your left / right leg extended and your other knee bent. Put a rolled towel or small pillow under your knee if told by your health care provider. 2. Slowly tense the muscles in the front of your left / right thigh. You should see your kneecap slide up toward your hip or see increased dimpling just above the knee. This motion will push the back of the knee toward the floor. 3. For __________ seconds, hold the muscle as tight as you can without increasing your pain. 4. Relax the muscles slowly and completely. Repeat __________ times. Complete this exercise __________ times a day. Straight leg raises This exercise stretches the muscles in front of your thigh (quadriceps) and the muscles that move your hips (hip flexors). 1. Lie on your back with your left / right leg extended and your other knee bent. 2. Tense the muscles in the front of your left / right thigh. You should see your kneecap slide up or see increased dimpling just above the knee. Your thigh may even shake a bit. 3. Keep these muscles tight as you raise your leg 4-6 inches (10-15 cm) off the floor. Do not let your knee bend. 4. Hold this position for __________ seconds. 5. Keep these muscles tense as you lower your leg. 6. Relax your muscles slowly and completely after each repetition. Repeat __________ times. Complete this exercise __________ times a day. Hamstring, isometric 1. Lie on your back on a firm surface. 2. Bend your left / right knee about __________ degrees. 3. Dig your left / right heel into the surface as if you are trying to pull it toward your buttocks. Tighten the muscles in the back of your thighs (hamstring) to "dig" as hard as you can without increasing any pain. 4. Hold this position for __________ seconds. 5. Release the tension gradually and allow your muscles to relax  completely for __________ seconds after each repetition. Repeat __________ times. Complete this exercise __________ times a day. Hamstring curls If told by your health care provider, do this exercise while wearing ankle weights. Begin with __________ lb weights. Then increase the weight by 1 lb (0.5 kg) increments. Do not wear ankle weights that are more than __________ lb. 1. Lie on your abdomen with your legs straight. 2. Bend your left / right knee as far as you  can without feeling pain. Keep your hips flat against the floor. 3. Hold this position for __________ seconds. 4. Slowly lower your leg to the starting position. Repeat __________ times. Complete this exercise __________ times a day. Squats This exercise strengthens the muscles in front of your thigh and knee (quadriceps). 1. Stand in front of a table, with your feet and knees pointing straight ahead. You may rest your hands on the table for balance but not for support. 2. Slowly bend your knees and lower your hips like you are going to sit in a chair. ? Keep your weight over your heels, not over your toes. ? Keep your lower legs upright so they are parallel with the table legs. ? Do not let your hips go lower than your knees. ? Do not bend lower than told by your health care provider. ? If your knee pain increases, do not bend as low. 3. Hold the squat position for __________ seconds. 4. Slowly push with your legs to return to standing. Do not use your hands to pull yourself to standing. Repeat __________ times. Complete this exercise __________ times a day. Wall slides This exercise strengthens the muscles in front of your thigh and knee (quadriceps). 1. Lean your back against a smooth wall or door, and walk your feet out 18-24 inches (46-61 cm) from it. 2. Place your feet hip-width apart. 3. Slowly slide down the wall or door until your knees bend __________ degrees. Keep your knees over your heels, not over your toes. Keep  your knees in line with your hips. 4. Hold this position for __________ seconds. Repeat __________ times. Complete this exercise __________ times a day. Straight leg raises This exercise strengthens the muscles that rotate the leg at the hip and move it away from your body (hip abductors). 1. Lie on your side with your left / right leg in the top position. Lie so your head, shoulder, knee, and hip line up. You may bend your bottom knee to help you keep your balance. 2. Roll your hips slightly forward so your hips are stacked directly over each other and your left / right knee is facing forward. 3. Leading with your heel, lift your top leg 4-6 inches (10-15 cm). You should feel the muscles in your outer hip lifting. ? Do not let your foot drift forward. ? Do not let your knee roll toward the ceiling. 4. Hold this position for __________ seconds. 5. Slowly return your leg to the starting position. 6. Let your muscles relax completely after each repetition. Repeat __________ times. Complete this exercise __________ times a day. Straight leg raises This exercise stretches the muscles that move your hips away from the front of the pelvis (hip extensors). 1. Lie on your abdomen on a firm surface. You can put a pillow under your hips if that is more comfortable. 2. Tense the muscles in your buttocks and lift your left / right leg about 4-6 inches (10-15 cm). Keep your knee straight as you lift your leg. 3. Hold this position for __________ seconds. 4. Slowly lower your leg to the starting position. 5. Let your leg relax completely after each repetition. Repeat __________ times. Complete this exercise __________ times a day. This information is not intended to replace advice given to you by your health care provider. Make sure you discuss any questions you have with your health care provider. Document Revised: 01/25/2018 Document Reviewed: 01/25/2018 [employer] Patient Education  [place]. Shoulder Exercises Ask  your health care provider which exercises are safe for you. Do exercises exactly as told by your health care provider and adjust them as directed. It is normal to feel mild stretching, pulling, tightness, or discomfort as you do these exercises. Stop right away if you feel sudden pain or your pain gets worse. Do not begin these exercises until told by your health care provider. Stretching exercises External rotation and abduction This exercise is sometimes called corner stretch. This exercise rotates your arm outward (external rotation) and moves your arm out from your body (abduction). 6. Stand in a doorway with one of your feet slightly in front of the other. This is called a staggered stance. If you cannot reach your forearms to the door frame, stand facing a corner of a room. 7. Choose one of the following positions as told by your health care provider: ? Place your hands and forearms on the door frame above your head. ? Place your hands and forearms on the door frame at the height of your head. ? Place your hands on the door frame at the height of your elbows. 8. Slowly move your weight onto your front foot until you feel a stretch across your chest and in the front of your shoulders. Keep your head and chest upright and keep your abdominal muscles tight. 9. Hold for __________ seconds. 10. To release the stretch, shift your weight to your back foot. Repeat __________ times. Complete this exercise __________ times a day. Extension, standing 5. Stand and hold a broomstick, a cane, or a similar object behind your back. ? Your hands should be a little wider than shoulder width apart. ? Your palms should face away from your back. 6. Keeping your elbows straight and your shoulder muscles relaxed, move the stick away from your body until you feel a stretch in your shoulders (extension). ? Avoid shrugging your shoulders while you move the stick. Keep your shoulder blades  tucked down toward the middle of your back. 7. Hold for __________ seconds. 8. Slowly return to the starting position. Repeat __________ times. Complete this exercise __________ times a day. Range-of-motion exercises Pendulum  5. Stand near a wall or a surface that you can hold onto for balance. 6. Bend at the waist and let your left / right arm hang straight down. Use your other arm to support you. Keep your back straight and do not lock your knees. 7. Relax your left / right arm and shoulder muscles, and move your hips and your trunk so your left / right arm swings freely. Your arm should swing because of the motion of your body, not because you are using your arm or shoulder muscles. 8. Keep moving your hips and trunk so your arm swings in the following directions, as told by your health care provider: ? Side to side. ? Forward and backward. ? In clockwise and counterclockwise circles. 9. Continue each motion for __________ seconds, or for as long as told by your health care provider. 10. Slowly return to the starting position. Repeat __________ times. Complete this exercise __________ times a day. Shoulder flexion, standing  5. Stand and hold a broomstick, a cane, or a similar object. Place your hands a little more than shoulder width apart on the object. Your left / right hand should be palm up, and your other hand should be palm down. 6. Keep your elbow straight and your shoulder muscles relaxed. Push the stick up with your healthy arm to raise your left /  right arm in front of your body, and then over your head until you feel a stretch in your shoulder (flexion). ? Avoid shrugging your shoulder while you raise your arm. Keep your shoulder blade tucked down toward the middle of your back. 7. Hold for __________ seconds. 8. Slowly return to the starting position. Repeat __________ times. Complete this exercise __________ times a day. Shoulder abduction, standing 5. Stand and hold a  broomstick, a cane, or a similar object. Place your hands a little more than shoulder width apart on the object. Your left / right hand should be palm up, and your other hand should be palm down. 6. Keep your elbow straight and your shoulder muscles relaxed. Push the object across your body toward your left / right side. Raise your left / right arm to the side of your body (abduction) until you feel a stretch in your shoulder. ? Do not raise your arm above shoulder height unless your health care provider tells you to do that. ? If directed, raise your arm over your head. ? Avoid shrugging your shoulder while you raise your arm. Keep your shoulder blade tucked down toward the middle of your back. 7. Hold for __________ seconds. 8. Slowly return to the starting position. Repeat __________ times. Complete this exercise __________ times a day. Internal rotation  7. Place your left / right hand behind your back, palm up. 8. Use your other hand to dangle an exercise band, a towel, or a similar object over your shoulder. Grasp the band with your left / right hand so you are holding on to both ends. 9. Gently pull up on the band until you feel a stretch in the front of your left / right shoulder. The movement of your arm toward the center of your body is called internal rotation. ? Avoid shrugging your shoulder while you raise your arm. Keep your shoulder blade tucked down toward the middle of your back. 10. Hold for __________ seconds. 11. Release the stretch by letting go of the band and lowering your hands. Repeat __________ times. Complete this exercise __________ times a day. Strengthening exercises External rotation  6. Sit in a stable chair without armrests. 7. Secure an exercise band to a stable object at elbow height on your left / right side. 8. Place a soft object, such as a folded towel or a small pillow, between your left / right upper arm and your body to move your elbow about 4 inches  (10 cm) away from your side. 9. Hold the end of the exercise band so it is tight and there is no slack. 10. Keeping your elbow pressed against the soft object, slowly move your forearm out, away from your abdomen (external rotation). Keep your body steady so only your forearm moves. 11. Hold for __________ seconds. 12. Slowly return to the starting position. Repeat __________ times. Complete this exercise __________ times a day. Shoulder abduction  5. Sit in a stable chair without armrests, or stand up. 6. Hold a __________ weight in your left / right hand, or hold an exercise band with both hands. 7. Start with your arms straight down and your left / right palm facing in, toward your body. 8. Slowly lift your left / right hand out to your side (abduction). Do not lift your hand above shoulder height unless your health care provider tells you that this is safe. ? Keep your arms straight. ? Avoid shrugging your shoulder while you do this movement. Keep  your shoulder blade tucked down toward the middle of your back. 9. Hold for __________ seconds. 10. Slowly lower your arm, and return to the starting position. Repeat __________ times. Complete this exercise __________ times a day. Shoulder extension 5. Sit in a stable chair without armrests, or stand up. 6. Secure an exercise band to a stable object in front of you so it is at shoulder height. 7. Hold one end of the exercise band in each hand. Your palms should face each other. 8. Straighten your elbows and lift your hands up to shoulder height. 9. Step back, away from the secured end of the exercise band, until the band is tight and there is no slack. 10. Squeeze your shoulder blades together as you pull your hands down to the sides of your thighs (extension). Stop when your hands are straight down by your sides. Do not let your hands go behind your body. 11. Hold for __________ seconds. 12. Slowly return to the starting position. Repeat  __________ times. Complete this exercise __________ times a day. Shoulder row 5. Sit in a stable chair without armrests, or stand up. 6. Secure an exercise band to a stable object in front of you so it is at waist height. 7. Hold one end of the exercise band in each hand. Position your palms so that your thumbs are facing the ceiling (neutral position). 8. Bend each of your elbows to a 90-degree angle (right angle) and keep your upper arms at your sides. 9. Step back until the band is tight and there is no slack. 10. Slowly pull your elbows back behind you. 11. Hold for __________ seconds. 12. Slowly return to the starting position. Repeat __________ times. Complete this exercise __________ times a day. Shoulder press-ups  7. Sit in a stable chair that has armrests. Sit upright, with your feet flat on the floor. 8. Put your hands on the armrests so your elbows are bent and your fingers are pointing forward. Your hands should be about even with the sides of your body. 9. Push down on the armrests and use your arms to lift yourself off the chair. Straighten your elbows and lift yourself up as much as you comfortably can. ? Move your shoulder blades down, and avoid letting your shoulders move up toward your ears. ? Keep your feet on the ground. As you get stronger, your feet should support less of your body weight as you lift yourself up. 10. Hold for __________ seconds. 11. Slowly lower yourself back into the chair. Repeat __________ times. Complete this exercise __________ times a day. Wall push-ups  6. Stand so you are facing a stable wall. Your feet should be about one arm-length away from the wall. 7. Lean forward and place your palms on the wall at shoulder height. 8. Keep your feet flat on the floor as you bend your elbows and lean forward toward the wall. 9. Hold for __________ seconds. 10. Straighten your elbows to push yourself back to the starting position. Repeat __________  times. Complete this exercise __________ times a day. This information is not intended to replace advice given to you by your health care provider. Make sure you discuss any questions you have with your health care provider. Document Revised: 07/29/2018 Document Reviewed: 05/06/2018 [employer] Patient Education  [place].

## 2019-07-31 NOTE — Progress Notes (Signed)
Office Visit Note  Patient: [name]             Date of Birth: 30-May-1965           MRN: [id number]             PCP: Brianna Rm, NP-C Referring: Brianna Rm, NP-C Visit Date: 07/31/2019 Occupation: @GUAROCC @  Subjective:  Pain in multiple joints   History of Present Illness: [name] is a 54 y.o. female with history of fibromyalgia and osteoarthritis.  Patient presents today with discomfort in multiple joints after falling this morning.  She states that it felt like her right hip locked up and she lost her balance and landed on her right side.  She is currently having discomfort in the right shoulder joint, right trochanter bursa, and right knee joint.  She states that she has tried wearing a right knee brace today which provides some relief.  She is also tried using Voltaren gel topically which has been effective but temporary.  She has also tried an oral over-the-counter pain reliever.  She continues to have generalized muscle aches and muscle tenderness due to fibromyalgia. She has been experiencing increased nocturnal pain, which has been worsening her insomnia.  She has chronic fatigue secondary to insomnia.  She denies any hand pain or joint swelling at this time.      Activities of Daily Living:  Patient reports morning stiffness for 30 minutes.   Patient Reports nocturnal pain.  Difficulty dressing/grooming: Denies Difficulty climbing stairs: Reports Difficulty getting out of chair: Reports Difficulty using hands for taps, buttons, cutlery, and/or writing: Denies  Review of Systems  Constitutional: Positive for fatigue.  HENT: Negative for mouth sores, mouth dryness and nose dryness.   Eyes: Negative for pain, visual disturbance and dryness.  Respiratory: Negative for cough, hemoptysis, shortness of breath and difficulty breathing.   Cardiovascular: Positive for swelling in legs/feet. Negative for chest pain, palpitations and  hypertension.  Gastrointestinal: Positive for constipation. Negative for blood in stool and diarrhea.  Endocrine: Negative for excessive thirst and increased urination.  Genitourinary: Negative for difficulty urinating and painful urination.  Musculoskeletal: Positive for arthralgias, gait problem, joint pain, joint swelling, muscle weakness, morning stiffness and muscle tenderness. Negative for myalgias and myalgias.  Skin: Negative for color change, pallor, rash, hair loss, nodules/bumps, skin tightness, ulcers and sensitivity to sunlight.  Allergic/Immunologic: Negative for susceptible to infections.  Neurological: Positive for numbness and weakness. Negative for dizziness and headaches.  Hematological: Negative for bruising/bleeding tendency and swollen glands.  Psychiatric/Behavioral: Positive for sleep disturbance. Negative for depressed mood. The patient is not nervous/anxious.     PMFS History:  Patient Active Problem List   Diagnosis Date Noted  . Elevated ALT measurement 10/12/2018  . GAD (generalized anxiety disorder) 04/08/2018  . Carpal tunnel syndrome, left upper limb 09/16/2017  . Carpal tunnel syndrome, right upper limb 09/16/2017  . History of migraine 08/24/2017  . Diabetes ([place])   . Vitamin D deficiency 01/08/2017  . Elevated LDL cholesterol level 08/10/2016  . Adenomatous polyps 07/13/2016  . Leg cramp 06/25/2015  . Primary osteoarthritis of both hands 06/25/2015  . Hip bursitis 06/25/2015  . 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 Chronic pain syndrome 06/25/2015  . Muscle pain, fibromyalgia 06/25/2015  . Fibromyalgia 06/17/2015    Past Medical History:  Diagnosis Date  . Arthritis   . Bursitis   . Carpal tunnel syndrome   . Chronic headaches   .  Constipation   . Degenerative disc disease, lumbar   . Depression   . Diabetes ([place])    diet controlled, no meds  . Fibromyalgia   . GAD (generalized anxiety disorder) 04/08/2018  . Insomnia   . Sciatica   . Snoring    . Tendonitis   . TMJ (dislocation of temporomandibular joint)     Family History  Problem Relation Age of Onset  . Hypertension Mother   . Glaucoma Mother   . Cataracts Mother   . Diabetes Father   . Hypertension Father   . Glaucoma Brother   . Ataxia Brother   . Fibromyalgia Daughter   . Migraines Daughter   . Migraines Daughter   . Asthma Daughter   . Breast cancer Maternal Grandfather   . Colon cancer Neg Hx   . Esophageal cancer Neg Hx   . Pancreatic cancer Neg Hx   . Rectal cancer Neg Hx   . Stomach cancer Neg Hx    Past Surgical History:  Procedure Laterality Date  . ABDOMINAL HYSTERECTOMY    . CESAREAN SECTION     x3  . cyst from hand    . DENTAL SURGERY    . KNEE SURGERY     right  . TEMPOROMANDIBULAR JOINT SURGERY    . TONSILLECTOMY     Social History   Social History Narrative  . Not on file   Immunization History  Administered Date(s) Administered  . PFIZER SARS-COV-2 Vaccination 06/17/2019, 07/08/2019  . Tdap 05/11/2016     Objective: Vital Signs: BP 114/69 (BP Location: Left Arm, Patient Position: Sitting, Cuff Size: Normal)   Pulse 91   Resp 16   Ht 5' 3.5" (1.613 m)   Wt 190 lb 9.6 oz (86.5 kg)   BMI 33.23 kg/m    Physical Exam Vitals and nursing note reviewed.  Constitutional:      Appearance: She is well-developed.  HENT:     Head: Normocephalic and atraumatic.  Eyes:     Conjunctiva/sclera: Conjunctivae normal.  Pulmonary:     Effort: Pulmonary effort is normal.  Abdominal:     General: Bowel sounds are normal.     Palpations: Abdomen is soft.  Musculoskeletal:     Cervical back: Normal range of motion.  Skin:    General: Skin is warm and dry.     Capillary Refill: Capillary refill takes less than 2 seconds.  Neurological:     Mental Status: She is alert and oriented to person, place, and time.  Psychiatric:        Behavior: Behavior normal.      Musculoskeletal Exam: Generalized hyperalgesia and positive tender  points. C-spine, thoracic spine, and lumbar spine good ROM.  No midline spinal tenderness.  Right SI joint tenderness.  Right shoulder abduction to about 120 degrees with discomfort.  Left shoulder has full ROM with no discomfort.  Elbow joints, wrist joints, MCPs, PIPs, and DIPs good ROM with no synovitis.  Complete fist formation bilaterally.  Hip joints good ROM with no discomfort.  Tenderness over the right trochanteric bursa.  Painful ROM of the right knee joint.  No instability noted.  No warmth or effusion of the right knee joint noted. Tenderness over the right pes anserine bursa.  Left knee has good ROM with no warmth or effusion.  Ankle joints good ROM with no discomfort.    CDAI Exam: CDAI Score: -- Patient Global: --; Provider Global: -- Swollen: --; Tender: -- Joint Exam 07/31/2019  No joint exam has been documented for this visit   There is currently no information documented on the homunculus. Go to the Rheumatology activity and complete the homunculus joint exam.  Investigation: No additional findings.  Imaging: XR KNEE 3 VIEW RIGHT  Result Date: 08/01/2019 Moderate medial compartment narrowing was noted.  Intercondylar and lateral osteophytes were noted.  Moderate patellofemoral narrowing was noted.  No chondrocalcinosis was noted. Impression: These findings are consistent with moderate osteoarthritis and moderate chondromalacia patella.   Recent Labs: Lab Results  Component Value Date   WBC 6.4 10/10/2018   HGB 12.5 10/10/2018   PLT 427 10/10/2018   NA 141 10/10/2018   K 4.3 10/10/2018   CL 104 10/10/2018   CO2 22 10/10/2018   GLUCOSE 104 (H) 10/10/2018   BUN 15 10/10/2018   CREATININE 0.78 10/10/2018   BILITOT 0.2 11/09/2018   ALKPHOS 99 11/09/2018   AST 15 11/09/2018   ALT 20 11/09/2018   PROT 6.5 11/09/2018   ALBUMIN 4.4 11/09/2018   CALCIUM 9.5 10/10/2018   GFRAA 100 10/10/2018    Speciality Comments: No specialty comments available.  Procedures:   Large Joint Inj: R greater trochanter on 07/31/2019 2:04 PM Indications: pain Details: 27 G 1.5 in needle, lateral approach  Arthrogram: No  Medications: 40 mg triamcinolone acetonide 40 MG/ML; 1.5 mL lidocaine 1 % Aspirate: 0 mL Outcome: tolerated well, no immediate complications Procedure, treatment alternatives, risks and benefits explained, specific risks discussed. Consent was given by the patient. Immediately prior to procedure a time out was called to verify the correct patient, procedure, equipment, support staff and site/side marked as required. Patient was prepped and draped in the usual sterile fashion.   Large Joint Inj: R knee on 07/31/2019 2:05 PM Indications: pain Details: 27 G 1.5 in needle, medial approach  Arthrogram: No  Medications: 1.5 mL lidocaine 1 %; 40 mg triamcinolone acetonide 40 MG/ML Aspirate: 0 mL Outcome: tolerated well, no immediate complications Procedure, treatment alternatives, risks and benefits explained, specific risks discussed. Consent was given by the patient. Immediately prior to procedure a time out was called to verify the correct patient, procedure, equipment, support staff and site/side marked as required. Patient was prepped and draped in the usual sterile fashion.     Allergies: Amoxicillin, Doxycycline, and Penicillins   Assessment / Plan:     Visit Diagnoses: Fibromyalgia - She has generalized hyperalgesia and positive tender points on exam. She is having increased generalized myalgias and muscle tenderness after having a fall this morning. She continues to take Cymbalta 60 mg 1 capsule by mouth daily. We discussed the importance of regular exercise and good sleep hygiene.  She will follow up in 6 months.   Trochanteric bursitis of right hip: Acute on chronic pain. She has tenderness over the right trochanteric bursa. She has difficulty laying on her side at night due to the discomfort.  She has been experiencing worsening nocturnal pain  recently.  She had a cortisone injection on 01/31/19, which provided significant pain relief.  She requested a repeat injection today.  She tolerated the procedure well.  Procedure note completed above.  Aftercare was discussed.  She was given a handout of exercises to perform.  Acute pain of right shoulder: She presents today with right shoulder discomfort.  She has painful and limited abduction to 120 degrees.  She has tenderness over the subacromial space.  She declined x-rays of the right shoulder joint today.  She was encouraged to use voltaren gel  topically, ice, and perform ROM exercises.  She given a handout of exercises to perform.  Chronic pain of right knee -She presents today with right knee joint pain after falling this morning. She has been experiencing increased discomfort due to right trochanteric bursitis, which caused her to lose her balance and fall on her right side. She previously had arthroscopic surgery on the right knee joint. She has good ROM of both knee joints.  She has discomfort with extension of the right knee joint.  No obvious instability noted.  She is not having any mechanical symptoms at this time.  She is able to bear full weight. X-rays of the right knee joint were obtained today.  No acute abnormalities were noted. Different treatment options were discussed. she requested a right knee joint cortisone injection.  She tolerated the procedure well.  Procedure note was completed above.  Aftercare was discussed. She was advised to monitor her blood glucose closely following the cortisone injection. She was encouraged to use ice, compression brace, rest, and apply voltaren gel topically as needed for pain relief.   She was given a handout of knee joint exercises.  she was advised to notify us if her discomfort persists or worsens and we can proceed with scheduling a MRI of the right knee joint. Plan: XR KNEE 3 VIEW RIGHT  Chronic pain syndrome  Other fatigue:  She has chronic  fatigue secondary to insomnia.   Other insomnia: She has been experiencing nocturnal pain, which has caused interrupted sleep at night. Good sleep hygiene was discussed.   Primary osteoarthritis of both hands:  She has mild osteoarthritis of both hands.  No tenderness or synovitis noted.  She has complete fist formation bilaterally.  She has not had any discomfort in her hands recently. Joint protection and muscle strengthening were discussed.   DDD (degenerative disc disease), lumbosacral: She experiences intermittent lower back pain.  No midline spinal tenderness at this time. She has no symptoms of radiculopathy at this time.   Chronic right SI joint pain: She has right SI joint tenderness on exam today.  She had a right SI joint cortisone injection on 01/31/19, which provided significant pain relief.  She was encouraged to perform stretching exercises daily.   Bilateral carpal tunnel syndrome: Asymptomatic at this time.   History of diabetes mellitus, type II: Hgb A1c 5.8 on 04/11/19.  She was advised to monitor her blood glucose level closely following the cortisone injections today.   Vitamin D deficiency: She is taking a vitamin D supplement.   History of depression: She takes cymbalta 60 mg 1 capsule by mouth daily.   Other medical conditions are listed as follows:   History of migraine  Adenomatous polyps  Orders: Orders Placed This Encounter  Procedures  . Large Joint Inj  . Large Joint Inj  . XR KNEE 3 VIEW RIGHT   No orders of the defined types were placed in this encounter.   Face-to-face time spent with patient was 30 minutes. Greater than 50% of time was spent in counseling and coordination of care.  Follow-Up Instructions: Return in about 6 months (around 01/30/2020) for Fibromyalgia, Osteoarthritis.   Ofilia Neas, PA-C  Note - This record has been created using Dragon software.  Chart creation errors have been sought, but may not always  have been  located. Such creation errors do not reflect on  the standard of medical care.

## 2019-08-01 ENCOUNTER — Ambulatory Visit: Payer: BC Managed Care – PPO | Admitting: Rheumatology

## 2019-08-10 ENCOUNTER — Other Ambulatory Visit: Payer: Self-pay

## 2019-08-10 ENCOUNTER — Encounter: Payer: Self-pay | Admitting: Family Medicine

## 2019-08-10 ENCOUNTER — Ambulatory Visit: Payer: BC Managed Care – PPO | Admitting: Family Medicine

## 2019-08-10 VITALS — BP 120/78 | HR 98 | Ht 65.0 in | Wt 188.2 lb

## 2019-08-10 DIAGNOSIS — E119 Type 2 diabetes mellitus without complications: Secondary | ICD-10-CM

## 2019-08-10 LAB — POCT GLYCOSYLATED HEMOGLOBIN (HGB A1C): Hemoglobin A1C: 6.1 % — AB (ref 4.0–5.6)

## 2019-08-10 MED ORDER — OZEMPIC (0.25 OR 0.5 MG/DOSE) 2 MG/1.5ML ~~LOC~~ SOPN: 0.5000 mg | PEN_INJECTOR | SUBCUTANEOUS | 1 refills | Status: DC

## 2019-08-10 NOTE — Progress Notes (Addendum)
  Subjective:    Patient ID: [name], female    DOB: [birth year], 54 y.o.   MRN: [id number]  [name] is a 54 y.o. female who presents for follow-up of Type 2 diabetes mellitus. She is not fasting and declines labs today.  Requests 3 month refill of Ozempic instead of 1 month.   Patient is checking home blood sugars.   Home blood sugar records: BGs range between 93 and 114 How often is blood sugars being checked: once a week Current symptoms include: none. Patient denies increased appetite, nausea, visual disturbances, vomiting and weight loss.  Patient is checking their feet daily. Any Foot concerns (callous, ulcer, wound, thickened nails, toenail fungus, skin fungus, hammer toe): none Last dilated eye exam: this year  Current treatments: doing well on meds. Taking once weekly Ozempic.  Medication compliance: good  Current diet: in general, a "healthy" diet   Current exercise: none Known diabetic complications: none   She received both Covid vaccines approximately 1 1/2 weeks ago.   The following portions of the patient's history were reviewed and updated as appropriate: allergies, current medications, past medical history, past social history and problem list.  ROS as in subjective above.     Objective:    Physical Exam Alert and in no distress otherwise not examined.  Blood pressure 120/78, pulse 98, height 5\' 5"  (1.651 m), weight 188 lb 3.2 oz (85.4 kg).  Lab Review Diabetic Labs Latest Ref Rng & Units 08/10/2019 04/11/2019 10/10/2018 04/08/2018 10/07/2017  HbA1c 4.0 - 5.6 % 6.1(A) 5.8(A) 6.4(A) 6.0(A) 6.1(A)  Microalbumin Not estab mg/dL - - - - -  Micro/Creat Ratio 0 - 29 mg/g creat - 4 - - 4.0  Chol 100 - 199 mg/dL - - 197 - -  HDL >39 mg/dL - - 57 - -  Calc LDL 0 - 99 mg/dL - - 112(H) - -  Triglycerides 0 - 149 mg/dL - - 142 - -  Creatinine 0.57 - 1.00 mg/dL - - 0.78 0.81 -   BP/Weight 08/10/2019 07/31/2019 04/11/2019 01/31/2019  [id number]  Systolic BP [id number] 123456 [id number]  Diastolic BP 78 69 72 81 72  Wt. (Lbs) 188.2 190.6 181 191.6 -  BMI 31.32 33.23 31.56 33.41 -   Foot/eye exam completion dates Latest Ref Rng & Units 11/05/2018 04/08/2018  Eye Exam No Retinopathy No Retinopathy -  Foot Form Completion - - Done    [name]  reports that she has never smoked. She has never used smokeless tobacco. She reports that she does not drink alcohol or use drugs.     Assessment & Plan:    Controlled type 2 diabetes mellitus without complication, without long-term current use of insulin ([place]) - Plan: Calcium-Magnesium-Zinc (CAL-MAG-ZINC PO), HgB A1c, Semaglutide,0.25 or 0.5MG /DOS, (OZEMPIC, 0.25 OR 0.5 MG/DOSE,) 2 MG/1.5ML SOPN  1. Rx changes: none Hgb A1c 6.1%  2. Education: Reviewed 'ABCs' of diabetes management (respective goals in parentheses):  A1C (<7), blood pressure (<130/80), and cholesterol (LDL <100). 3. Compliance at present is estimated to be good. Efforts to improve compliance (if necessary) will be directed at dietary modifications: limit carbohydrates and sweets, increased exercise and regular blood sugar monitoring: daily. 4. She is not fasting today. Declines labs.  5. Follow up: 3 months for fasting CPE

## 2019-08-13 ENCOUNTER — Encounter: Payer: Self-pay | Admitting: Family Medicine

## 2019-08-13 DIAGNOSIS — E119 Type 2 diabetes mellitus without complications: Secondary | ICD-10-CM

## 2019-08-15 ENCOUNTER — Encounter: Payer: Self-pay | Admitting: Family Medicine

## 2019-08-21 ENCOUNTER — Encounter: Payer: Self-pay | Admitting: Family Medicine

## 2019-08-21 ENCOUNTER — Other Ambulatory Visit: Payer: Self-pay

## 2019-08-21 ENCOUNTER — Telehealth: Payer: BC Managed Care – PPO | Admitting: Family Medicine

## 2019-08-21 VITALS — Temp 98.2°F | Ht 65.0 in | Wt 188.0 lb

## 2019-08-21 DIAGNOSIS — J069 Acute upper respiratory infection, unspecified: Secondary | ICD-10-CM

## 2019-08-21 NOTE — Patient Instructions (Signed)
Drink plenty of fluids to keep mucus/secretions thin. Use nasal saline spray frequently to help with nasal congestion and dryness. I recommend trying sinus rinses (either sinus rinse kit or Neti-pot) to help with sinus pressure. Consider using Mucinex or Robitussin (guaifenesin is an expectorant that will keep mucus thin), with or without dextromethorphan (cough suppressant found in DM versions of mucinex and robitussin).  I would consider getting COVID test--you have been immunized, so less likely, but might give everyone around you some reassurance; if it is positive, it is considered a breakthrough case and would need to be reported.  If you symptoms (discolored mucus and pressure/pain) are getting worse rather than improving, than contact me and we can send in an antibiotic.  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  I hope you feel better soon!

## 2019-08-21 NOTE — Progress Notes (Signed)
Start time: 2:48 End time: 3:06 Virtual Visit via Video Note  I connected with [name] on 08/21/19  by a video enabled telemedicine application and verified that I am speaking with the correct person using two identifiers.  Location: Patient: work, in her office Provider: office   I discussed the limitations of evaluation and management by telemedicine and the availability of in person appointments. The patient expressed understanding and agreed to proceed.  History of Present Illness: Chief Complaint  Patient presents with  . Facial Pain    VIRTUAL for sinus infection. Pressure in her nose, runny nose. Mucus is yellow. Has drainage going down her throat. HA. Symptoms started Friday. She is coughing. No fever.    3 days ago she started with runny nose, nasal stuffiness.  She went out and cut the grass, but wore a mask due to the pollen.  Mucus was clear and runny at first.  That night she started coughing, had increased nasal drainage.  The drainage turned cloudy, and is now yellow.   Pressure is in the center of her forehead and stuffy/pressure in both cheeks.  She has had her COVID vaccines, last dose 3/20. 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  She was in the toddler room, had to clean some drippy noses.    PMH, PSH, SH reviewed  Outpatient Encounter Medications as of 08/21/2019  Medication Sig  . Calcium-Magnesium-Zinc (CAL-MAG-ZINC PO) Take by mouth.  . cetirizine (ZYRTEC) 10 MG tablet Take 10 mg by mouth daily.  . Cholecalciferol (VITAMIN D PO) Take 2 tablets by mouth.  . CONTOUR NEXT TEST test strip TEST ONCE DAILY. PT USES CONTOUR NEXT METER  . DULoxetine (CYMBALTA) 60 MG capsule TAKE 1 CAPSULE BY MOUTH EVERY DAY  . losartan (COZAAR) 25 MG tablet TAKE 1 TABLET BY MOUTH EVERY DAY  . ONETOUCH DELICA LANCETS FINE MISC Test once daily. Has one touch verio meter  . pseudoephedrine (SUDAFED) 30 MG tablet  Take 30 mg by mouth every 4 (four) hours as needed for congestion.  . Semaglutide,0.25 or 0.5MG /DOS, (OZEMPIC, 0.25 OR 0.5 MG/DOSE,) 2 MG/1.5ML SOPN Inject 0.5 mg as directed once a week.  . simvastatin (ZOCOR) 20 MG tablet TAKE 1 TABLET BY MOUTH EVERYDAY AT BEDTIME  . [DISCONTINUED] Multiple Minerals-Vitamins (CAL-MAG-ZINC-D PO) Take by mouth.   No facility-administered encounter medications on file as of 08/21/2019.   Allergies  Allergen Reactions  . Amoxicillin Swelling  . Doxycycline Nausea And Vomiting  . Penicillins Swelling   ROS:  See HPI. No known fever or chills.  Cough is worse at night. Denies shortness of breath, chest pain.  Denies bleeding, bruising, rashes.  No nausea, vomiting or diarrhea.  Denies body aches, joint pains.  No loss of taste/smell.    Observations/Objective: Temp 98.2 F (36.8 C) (Temporal)   Ht 5\' 5"  (1.651 m)   Wt 188 lb (85.3 kg)   BMI 31.28 kg/m   Pleasant, well-appearing female in no distress. She is speaking easily, no coughing or throat clearing noted during visit She is alert and oriented, in good spirits. Exam is limited due to virtual nature of the visit.    Assessment and Plan:  Viral URI - supportive measures reviewed.   Consider COVID test  Stay well hydrated. Nasal saline spray Sinuses rinses mucinex if mucus is thick +/- DM  Follow Up Instructions:    I discussed the assessment and treatment plan with the patient.  The patient was provided an opportunity to ask questions and all were answered. The patient agreed with the plan and demonstrated an understanding of the instructions.   The patient was advised to call back or seek an in-person evaluation if the symptoms worsen or if the condition fails to improve as anticipated.  I provided 18 minutes of non-face-to-face time during this encounter. Additional time spent in chart review and documentation  Vikki Ports, MD

## 2019-08-22 ENCOUNTER — Encounter: Payer: Self-pay | Admitting: Family Medicine

## 2019-09-03 DIAGNOSIS — G8929 Other chronic pain: Secondary | ICD-10-CM

## 2019-09-04 NOTE — Telephone Encounter (Signed)
At her last office visit we discussed that if her right knee joint pain persisted or worsened we would proceed with ordering a MRI.  Please ask the patient if she would like [place] to place the order for a right knee MRI.  We recommend OTC products for pain relief.

## 2019-09-05 DIAGNOSIS — Z5181 Encounter for therapeutic drug level monitoring: Secondary | ICD-10-CM

## 2019-09-07 ENCOUNTER — Other Ambulatory Visit: Payer: Self-pay

## 2019-09-07 DIAGNOSIS — Z5181 Encounter for therapeutic drug level monitoring: Secondary | ICD-10-CM

## 2019-09-08 ENCOUNTER — Telehealth: Payer: Self-pay | Admitting: Rheumatology

## 2019-09-08 ENCOUNTER — Telehealth: Payer: Self-pay

## 2019-09-08 LAB — COMPLETE METABOLIC PANEL WITH GFR
AG Ratio: 1.7 (calc) (ref 1.0–2.5)
ALT: 23 U/L (ref 6–29)
AST: 16 U/L (ref 10–35)
Albumin: 3.9 g/dL (ref 3.6–5.1)
Alkaline phosphatase (APISO): 98 U/L (ref 37–153)
BUN: 17 mg/dL (ref 7–25)
CO2: 25 mmol/L (ref 20–32)
Calcium: 9.1 mg/dL (ref 8.6–10.4)
Chloride: 105 mmol/L (ref 98–110)
Creat: 0.92 mg/dL (ref 0.50–1.05)
GFR, Est African American: 81 mL/min/{1.73_m2} (ref >=60)
GFR, Est Non African American: 70 mL/min/{1.73_m2} (ref >=60)
Globulin: 2.3 g/dL (calc) (ref 1.9–3.7)
Glucose, Bld: 107 mg/dL — ABNORMAL HIGH (ref 65–99)
Potassium: 4.1 mmol/L (ref 3.5–5.3)
Sodium: 143 mmol/L (ref 135–146)
Total Bilirubin: 0.5 mg/dL (ref 0.2–1.2)
Total Protein: 6.2 g/dL (ref 6.1–8.1)

## 2019-09-08 LAB — CBC WITH DIFFERENTIAL/PLATELET
Absolute Monocytes: 531 cells/uL (ref 200–950)
Basophils Absolute: 23 cells/uL (ref 0–200)
Basophils Relative: 0.3 %
Eosinophils Absolute: 54 cells/uL (ref 15–500)
Eosinophils Relative: 0.7 %
HCT: 38.8 % (ref 35.0–45.0)
Hemoglobin: 12.7 g/dL (ref 11.7–15.5)
Lymphs Abs: 2333 cells/uL (ref 850–3900)
MCH: 28.9 pg (ref 27.0–33.0)
MCHC: 32.7 g/dL (ref 32.0–36.0)
MCV: 88.2 fL (ref 80.0–100.0)
MPV: 11.3 fL (ref 7.5–12.5)
Monocytes Relative: 6.9 %
Neutro Abs: 4759 cells/uL (ref 1500–7800)
Neutrophils Relative %: 61.8 %
Platelets: 233 10*3/uL (ref 140–400)
RBC: 4.4 10*6/uL (ref 3.80–5.10)
RDW: 13.8 % (ref 11.0–15.0)
Total Lymphocyte: 30.3 %
WBC: 7.7 10*3/uL (ref 3.8–10.8)

## 2019-09-08 MED ORDER — CELECOXIB 200 MG PO CAPS: 200.0000 mg | ORAL_CAPSULE | Freq: Every day | ORAL | 0 refills | Status: DC | PRN

## 2019-09-08 NOTE — Telephone Encounter (Signed)
Patient came for the requested labs prior to a celebrex prescription.   Labs: 09/07/2019 Glucose is 107. Rest of CMP WNL. CBC WNL.   Please advise celebrex dose. Thanks!

## 2019-09-08 NOTE — Telephone Encounter (Signed)
Prescription has been sent to the pharmacy, patient is aware.

## 2019-09-08 NOTE — Telephone Encounter (Signed)
I spoke with nurse for prior authorization.  Patient's clinical situation was discussed.  She approved the MRI for the right knee joint.  Authorization#[id number].  It is valid from 09/08/19- 03/05/20.  Bo Merino, MD

## 2019-09-08 NOTE — Telephone Encounter (Signed)
Ok to send in Celebrex 200 mg 1 capsule by mouth daily as needed for pain relief.  Dispense 30 tablets with zero refills.

## 2019-09-08 NOTE — Progress Notes (Signed)
Glucose is 107. Rest of CMP WNL.  CBC WNL.

## 2019-09-22 ENCOUNTER — Ambulatory Visit (HOSPITAL_COMMUNITY): Payer: BC Managed Care – PPO

## 2019-09-30 DIAGNOSIS — M25561 Pain in right knee: Secondary | ICD-10-CM | POA: Diagnosis not present

## 2019-10-01 ENCOUNTER — Other Ambulatory Visit: Payer: Self-pay | Admitting: Family Medicine

## 2019-10-02 NOTE — Telephone Encounter (Signed)
[facility] is requesting to fill pt cymbalta for 90 days. Please advise [facility]

## 2019-10-02 NOTE — Telephone Encounter (Signed)
Ok to refill 

## 2019-10-06 NOTE — Telephone Encounter (Signed)
Patient advised we have received report for MRI. Patient advised Dr. Estanislado Pandy recommends she sees orthopedics. Patient states she sees a orthopedist at [facility] and will call to make an appointment.

## 2019-10-09 ENCOUNTER — Other Ambulatory Visit: Payer: Self-pay | Admitting: Physician Assistant

## 2019-10-09 ENCOUNTER — Other Ambulatory Visit: Payer: Self-pay | Admitting: Family Medicine

## 2019-10-09 DIAGNOSIS — E119 Type 2 diabetes mellitus without complications: Secondary | ICD-10-CM

## 2019-10-10 DIAGNOSIS — M25561 Pain in right knee: Secondary | ICD-10-CM | POA: Diagnosis not present

## 2019-10-10 NOTE — Telephone Encounter (Signed)
Last Visit: 07/31/2019 Next Visit: 01/30/2020 Labs: 5/20/2021Glucose is 107. Rest of CMP WNL. CBC WNL.  Okay to refill per Dr. Deveshwar  

## 2019-10-18 NOTE — Patient Instructions (Addendum)
Call and schedule with Dr. Loletha Carrow at [facility].  Also recommend that you call and schedule with your OB/GYN since you are due. You can ask your OB/GYN about getting a bone density scan.  Let me know if I need to order this for you.  Make sure you are eating a well-balanced diet.  I will be in touch with your lab results.  Good luck with your knee injections   Preventive Care 55-56 Years Old, Female Preventive care refers to visits with your health care provider and lifestyle choices that can promote health and wellness. This includes:  A yearly physical exam. This may also be called an annual well check.  Regular dental visits and eye exams.  Immunizations.  Screening for certain conditions.  Healthy lifestyle choices, such as eating a healthy diet, getting regular exercise, not using drugs or products that contain nicotine and tobacco, and limiting alcohol use. What can I expect for my preventive care visit? Physical exam Your health care provider will check your:  Height and weight. This may be used to calculate body mass index (BMI), which tells if you are at a healthy weight.  Heart rate and blood pressure.  Skin for abnormal spots. Counseling Your health care provider may ask you questions about your:  Alcohol, tobacco, and drug use.  Emotional well-being.  Home and relationship well-being.  Sexual activity.  Eating habits.  Work and work Statistician.  Method of birth control.  Menstrual cycle.  Pregnancy history. What immunizations do I need?  Influenza (flu) vaccine  This is recommended every year. Tetanus, diphtheria, and pertussis (Tdap) vaccine  You may need a Td booster every 10 years. Varicella (chickenpox) vaccine  You may need this if you have not been vaccinated. Zoster (shingles) vaccine  You may need this after age 79. Measles, mumps, and rubella (MMR) vaccine  You may need at least one dose of MMR if you were born in 1957 or  later. You may also need a second dose. Pneumococcal conjugate (PCV13) vaccine  You may need this if you have certain conditions and were not previously vaccinated. Pneumococcal polysaccharide (PPSV23) vaccine  You may need one or two doses if you smoke cigarettes or if you have certain conditions. Meningococcal conjugate (MenACWY) vaccine  You may need this if you have certain conditions. Hepatitis A vaccine  You may need this if you have certain conditions or if you travel or work in places where you may be exposed to hepatitis A. Hepatitis B vaccine  You may need this if you have certain conditions or if you travel or work in places where you may be exposed to hepatitis B. Haemophilus influenzae type b (Hib) vaccine  You may need this if you have certain conditions. Human papillomavirus (HPV) vaccine  If recommended by your health care provider, you may need three doses over 6 months. You may receive vaccines as individual doses or as more than one vaccine together in one shot (combination vaccines). Talk with your health care provider about the risks and benefits of combination vaccines. What tests do I need? Blood tests  Lipid and cholesterol levels. These may be checked every 5 years, or more frequently if you are over 72 years old.  Hepatitis C test.  Hepatitis B test. Screening  Lung cancer screening. You may have this screening every year starting at age 53 if you have a 30-pack-year history of smoking and currently smoke or have quit within the past 15 years.  Colorectal  cancer screening. All adults should have this screening starting at age 67 and continuing until age 41. Your health care provider may recommend screening at age 3 if you are at increased risk. You will have tests every 1-10 years, depending on your results and the type of screening test.  Diabetes screening. This is done by checking your blood sugar (glucose) after you have not eaten for a while  (fasting). You may have this done every 1-3 years.  Mammogram. This may be done every 1-2 years. Talk with your health care provider about when you should start having regular mammograms. This may depend on whether you have a family history of breast cancer.  BRCA-related cancer screening. This may be done if you have a family history of breast, ovarian, tubal, or peritoneal cancers.  Pelvic exam and Pap test. This may be done every 3 years starting at age 54. Starting at age 49, this may be done every 5 years if you have a Pap test in combination with an HPV test. Other tests  Sexually transmitted disease (STD) testing.  Bone density scan. This is done to screen for osteoporosis. You may have this scan if you are at high risk for osteoporosis. Follow these instructions at home: Eating and drinking  Eat a diet that includes fresh fruits and vegetables, whole grains, lean protein, and low-fat dairy.  Take vitamin and mineral supplements as recommended by your health care provider.  Do not drink alcohol if: ? Your health care provider tells you not to drink. ? You are pregnant, may be pregnant, or are planning to become pregnant.  If you drink alcohol: ? Limit how much you have to 0-1 drink a day. ? Be aware of how much alcohol is in your drink. In the [place] one drink equals one 12 oz bottle of beer (355 mL), one 5 oz glass of wine (148 mL), or one 1 oz glass of hard liquor (44 mL). Lifestyle  Take daily care of your teeth and gums.  Stay active. Exercise for at least 30 minutes on 5 or more days each week.  Do not use any products that contain nicotine or tobacco, such as cigarettes, e-cigarettes, and chewing tobacco. If you need help quitting, ask your health care provider.  If you are sexually active, practice safe sex. Use a condom or other form of birth control (contraception) in order to prevent pregnancy and STIs (sexually transmitted infections).  If told by your health  care provider, take low-dose aspirin daily starting at age 50. What's next?  Visit your health care provider once a year for a well check visit.  Ask your health care provider how often you should have your eyes and teeth checked.  Stay up to date on all vaccines. This information is not intended to replace advice given to you by your health care provider. Make sure you discuss any questions you have with your health care provider. Document Revised: 12/16/2017 Document Reviewed: 12/16/2017 Elsevier Patient Education  2020 [employer].

## 2019-10-18 NOTE — Progress Notes (Signed)
Subjective:    Patient ID: [name], female    DOB: May 25, 1965, 54 y.o.   MRN: [id number]  HPI Chief Complaint  Patient presents with  . fasting cpe    fasting cpe, sees obgyn   She is here for a complete physical exam.  Reports feeling well. States she has severe osteoarthritis in her right knee and is seen her orthopedist later today for gel knee injection.  Other providers: Orthopedist- Percell Miller and [facility]  Rheumatologist- Dr. Estanislado Pandy OB/[facility]-  GI- Dr. Loletha Carrow    Diabetes- last A1c 6.1% in April 2021.  Reports doing well on her medication and no concerns.  Vitamin D deficiency and she is taking a supplement daily.  Reports taking her statin daily without any issues.  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 She does eat [employer] frozen meals.  Excerise: not able to do much because of knee pain   Immunizations: Covid vaccine received, Tdap UTD. Declines pneumonia vaccine.   Health maintenance:  Mammogram: 03/2019 Colonoscopy: 06/2016. Recall  Last Gynecological Exam: 2019  Last Menstrual cycle:  Hysterectomy  Last Dental Exam: every 3 months  Last Eye Exam: October 2020  Wears seatbelt always,  smoke detectors in home and functioning, does not text while driving and feels safe in home environment.   Reviewed allergies, medications, past medical, surgical, family, and social history.   Review of Systems Review of Systems Constitutional: -fever, -chills, -sweats, -unexpected weight change,-fatigue ENT: -runny nose, -ear pain, -sore throat Cardiology:  -chest pain, -palpitations, -edema Respiratory: -cough, -shortness of breath, -wheezing Gastroenterology: -abdominal pain, -nausea, -vomiting, -diarrhea, -constipation  Hematology: -bleeding or bruising problems Musculoskeletal: +arthralgias,  -myalgias, -joint swelling, -back pain Ophthalmology: -vision changes Urology: -dysuria, -difficulty urinating, -hematuria, -urinary frequency, -urgency Neurology: -headache, -weakness, -tingling, -numbness       Objective:   Physical Exam BP 110/70   Pulse 100   Ht 5\' 5"  (1.651 m)   Wt 191 lb 9.6 oz (86.9 kg)   BMI 31.88 kg/m   General Appearance:    Alert, cooperative, no distress, appears stated age  Head:    Normocephalic, without obvious abnormality, atraumatic  Eyes:    PERRL, conjunctiva/corneas clear, EOM's intact  Ears:    Normal TM's and external ear canals  Nose:   Mask on   Throat:   Mask on   Neck:   Supple, no lymphadenopathy;  thyroid:  no   enlargement/tenderness/nodules; no JVD  Back:    Spine nontender, no curvature, ROM normal, no CVA     tenderness  Lungs:     Clear to auscultation bilaterally without wheezes, rales or     ronchi; respirations unlabored  Chest Wall:    No tenderness or deformity   Heart:    Regular rate and rhythm, S1 and S2 normal, no murmur, rub   or gallop  Breast Exam:    OB/GYN  Abdomen:     Soft, non-tender, nondistended, normoactive bowel sounds,    no masses, no hepatosplenomegaly  Genitalia:    OB/GYN      Extremities:   No clubbing, cyanosis or edema  Pulses:   2+ and symmetric all extremities  Skin:   Skin color, texture, turgor normal, no rashes or lesions  Lymph nodes:   Cervical, supraclavicular, and axillary nodes normal  Neurologic:   CNII-XII intact, normal strength,  sensation and gait; reflexes 2+ and symmetric throughout          Psych:   Normal mood, affect, hygiene and grooming.         Assessment & Plan:  Routine general medical examination at a health care facility - Plan: TSH, T4, free, T3 -She is here today for fasting CPE.  Reviewed CBC, CMP and hemoglobin A1c from her previous visit last month.  She is in good spirits and feels good except for arthritis in her right knee which is being addressed later today  with her orthopedist.  Preventive health care reviewed.  She is due to follow-up with GI for colonoscopy per EMR.  She will call and schedule with Dr. Loletha Carrow.  Mammogram is up-to-date.  She plans to call her OB/GYN and follow-up.  Discussed that she has never had a bone density scan and I recommend that she have this done.  Up-to-date on eye exam and dental exams.  Immunizations reviewed.  She is up-to-date on Tdap and Covid vaccines.  She declines pneumonia vaccine.  Discussed safety and health promotion.  Type 2 diabetes mellitus without complication, without long-term current use of insulin (HCC) -A1c on 08/10/2019 6.1% and her diabetes is well controlled.  Continue on Ozempic.  To new keep an eye on her blood sugars at home.  She will return in 4 months for diabetes visit.  Normal diabetic foot exam.  Up-to-date on diabetic eye exam.  Declines pneumonia vaccine.  Vitamin D deficiency - Plan: VITAMIN D 25 Hydroxy (Vit-D Deficiency, Fractures) -She is currently taking a supplement.  Check vitamin D level and follow-up.  Elevated LDL cholesterol level - Plan: Lipid panel -Continue statin therapy.  Follow-up pending lipid panel  Screening for thyroid disorder - Plan: TSH, T4, free, T3

## 2019-10-19 ENCOUNTER — Encounter: Payer: Self-pay | Admitting: Family Medicine

## 2019-10-19 ENCOUNTER — Other Ambulatory Visit: Payer: Self-pay

## 2019-10-19 ENCOUNTER — Ambulatory Visit: Payer: BC Managed Care – PPO | Admitting: Family Medicine

## 2019-10-19 VITALS — BP 110/70 | HR 92 | Ht 65.0 in | Wt 191.6 lb

## 2019-10-19 DIAGNOSIS — E559 Vitamin D deficiency, unspecified: Secondary | ICD-10-CM | POA: Diagnosis not present

## 2019-10-19 DIAGNOSIS — Z0000 Encounter for general adult medical examination without abnormal findings: Secondary | ICD-10-CM | POA: Diagnosis not present

## 2019-10-19 DIAGNOSIS — Z1329 Encounter for screening for other suspected endocrine disorder: Secondary | ICD-10-CM

## 2019-10-19 DIAGNOSIS — E119 Type 2 diabetes mellitus without complications: Secondary | ICD-10-CM | POA: Diagnosis not present

## 2019-10-19 DIAGNOSIS — E7800 Pure hypercholesterolemia, unspecified: Secondary | ICD-10-CM | POA: Diagnosis not present

## 2019-10-19 DIAGNOSIS — M1711 Unilateral primary osteoarthritis, right knee: Secondary | ICD-10-CM | POA: Diagnosis not present

## 2019-10-20 ENCOUNTER — Other Ambulatory Visit: Payer: Self-pay | Admitting: Internal Medicine

## 2019-10-20 DIAGNOSIS — Z79899 Other long term (current) drug therapy: Secondary | ICD-10-CM

## 2019-10-20 DIAGNOSIS — E7800 Pure hypercholesterolemia, unspecified: Secondary | ICD-10-CM

## 2019-10-20 LAB — T3: T3, Total: 101 ng/dL (ref 71–180)

## 2019-10-20 LAB — LIPID PANEL
Chol/HDL Ratio: 2.6 ratio (ref 0.0–4.4)
Cholesterol, Total: 181 mg/dL (ref 100–199)
HDL: 69 mg/dL (ref >39)
LDL Chol Calc (NIH): 99 mg/dL (ref 0–99)
Triglycerides: 67 mg/dL (ref 0–149)
VLDL Cholesterol Cal: 13 mg/dL (ref 5–40)

## 2019-10-20 LAB — VITAMIN D 25 HYDROXY (VIT D DEFICIENCY, FRACTURES): Vit D, 25-Hydroxy: 34.8 ng/mL (ref 30.0–100.0)

## 2019-10-20 LAB — T4, FREE: Free T4: 1.05 ng/dL (ref 0.82–1.77)

## 2019-10-20 LAB — TSH: TSH: 0.796 u[IU]/mL (ref 0.450–4.500)

## 2019-10-20 MED ORDER — SIMVASTATIN 40 MG PO TABS: 40.0000 mg | ORAL_TABLET | Freq: Every day | ORAL | 1 refills | Status: DC

## 2019-10-20 NOTE — Progress Notes (Signed)
Please send in simvastatin 40 mg and cancel the 20 mg. She is aware. She will need a lipid panel and CMP in 6 weeks. Lab visit only.

## 2019-10-26 DIAGNOSIS — M1711 Unilateral primary osteoarthritis, right knee: Secondary | ICD-10-CM | POA: Diagnosis not present

## 2019-11-02 ENCOUNTER — Other Ambulatory Visit: Payer: Self-pay | Admitting: Rheumatology

## 2019-11-02 DIAGNOSIS — M1711 Unilateral primary osteoarthritis, right knee: Secondary | ICD-10-CM | POA: Diagnosis not present

## 2019-11-02 NOTE — Telephone Encounter (Signed)
Last Visit: 07/31/2019 Next Visit: 01/30/2020 Labs: 5/20/2021Glucose is 107. Rest of CMP WNL. CBC WNL.  Okay to refill per Dr. Deveshwar  

## 2019-12-03 ENCOUNTER — Encounter: Payer: Self-pay | Admitting: Family Medicine

## 2019-12-04 ENCOUNTER — Other Ambulatory Visit: Payer: BC Managed Care – PPO

## 2019-12-04 DIAGNOSIS — Z79899 Other long term (current) drug therapy: Secondary | ICD-10-CM

## 2019-12-04 DIAGNOSIS — E7800 Pure hypercholesterolemia, unspecified: Secondary | ICD-10-CM | POA: Diagnosis not present

## 2019-12-05 LAB — COMPREHENSIVE METABOLIC PANEL
ALT: 21 IU/L (ref 0–32)
AST: 19 IU/L (ref 0–40)
Albumin/Globulin Ratio: 1.9 (ref 1.2–2.2)
Albumin: 4.4 g/dL (ref 3.8–4.9)
Alkaline Phosphatase: 116 IU/L (ref 48–121)
BUN/Creatinine Ratio: 17 (ref 9–23)
BUN: 16 mg/dL (ref 6–24)
Bilirubin Total: 0.4 mg/dL (ref 0.0–1.2)
CO2: 24 mmol/L (ref 20–29)
Calcium: 9.5 mg/dL (ref 8.7–10.2)
Chloride: 105 mmol/L (ref 96–106)
Creatinine, Ser: 0.94 mg/dL (ref 0.57–1.00)
GFR calc Af Amer: 79 mL/min/{1.73_m2} (ref >59)
GFR calc non Af Amer: 69 mL/min/{1.73_m2} (ref >59)
Globulin, Total: 2.3 g/dL (ref 1.5–4.5)
Glucose: 88 mg/dL (ref 65–99)
Potassium: 4 mmol/L (ref 3.5–5.2)
Sodium: 143 mmol/L (ref 134–144)
Total Protein: 6.7 g/dL (ref 6.0–8.5)

## 2019-12-05 LAB — LIPID PANEL
Chol/HDL Ratio: 2.4 ratio (ref 0.0–4.4)
Cholesterol, Total: 160 mg/dL (ref 100–199)
HDL: 66 mg/dL (ref >39)
LDL Chol Calc (NIH): 83 mg/dL (ref 0–99)
Triglycerides: 52 mg/dL (ref 0–149)
VLDL Cholesterol Cal: 11 mg/dL (ref 5–40)

## 2019-12-12 MED ORDER — CELECOXIB 200 MG PO CAPS: 200.0000 mg | ORAL_CAPSULE | Freq: Every day | ORAL | 0 refills | Status: DC | PRN

## 2019-12-12 NOTE — Telephone Encounter (Signed)
Last Visit: 07/31/2019 Next Visit: 01/30/2020 Labs: 5/20/2021Glucose is 107. Rest of CMP WNL. CBC WNL.  Okay to refill per Dr. Estanislado Pandy

## 2019-12-16 ENCOUNTER — Other Ambulatory Visit: Payer: Self-pay | Admitting: Family Medicine

## 2020-01-01 ENCOUNTER — Other Ambulatory Visit: Payer: Self-pay | Admitting: Family Medicine

## 2020-01-01 DIAGNOSIS — E119 Type 2 diabetes mellitus without complications: Secondary | ICD-10-CM

## 2020-01-09 ENCOUNTER — Other Ambulatory Visit: Payer: Self-pay | Admitting: Rheumatology

## 2020-01-09 NOTE — Telephone Encounter (Signed)
Last Visit: 07/31/2019 Next Visit: 01/30/2020 Labs: 09/07/2019 CBC WNL 12/04/2019 WNL  Okay to refill per Dr. Estanislado Pandy

## 2020-01-16 NOTE — Progress Notes (Deleted)
Office Visit Note  Patient: [name]             Date of Birth: 1965/08/07           MRN: [id number]             PCP: Girtha Rm, NP-C Referring: Girtha Rm, NP-C Visit Date: 01/30/2020 Occupation: @GUAROCC @  Subjective:  No chief complaint on file.   History of Present Illness: [name] is a 54 y.o. female ***   Activities of Daily Living:  Patient reports morning stiffness for *** {minute/hour:19697}.   Patient {ACTIONS;DENIES/REPORTS:21021675::"Denies"} nocturnal pain.  Difficulty dressing/grooming: {ACTIONS;DENIES/REPORTS:21021675::"Denies"} Difficulty climbing stairs: {ACTIONS;DENIES/REPORTS:21021675::"Denies"} Difficulty getting out of chair: {ACTIONS;DENIES/REPORTS:21021675::"Denies"} Difficulty using hands for taps, buttons, cutlery, and/or writing: {ACTIONS;DENIES/REPORTS:21021675::"Denies"}  No Rheumatology ROS completed.   PMFS History:  Patient Active Problem List   Diagnosis Date Noted  . Elevated ALT measurement 10/12/2018  . GAD (generalized anxiety disorder) 04/08/2018  . Carpal tunnel syndrome, left upper limb 09/16/2017  . Carpal tunnel syndrome, right upper limb 09/16/2017  . History of migraine 08/24/2017  . Diabetes ([place])   . Vitamin D deficiency 01/08/2017  . Elevated LDL cholesterol level 08/10/2016  . Adenomatous polyps 07/13/2016  . Leg cramp 06/25/2015  . Primary osteoarthritis of both hands 06/25/2015  . Hip bursitis 06/25/2015  . 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 Chronic pain syndrome 06/25/2015  . Muscle pain, fibromyalgia 06/25/2015  . Fibromyalgia 06/17/2015    Past Medical History:  Diagnosis Date  . Arthritis   . Arthritis of knee, right   . Bursitis   . Carpal tunnel syndrome   . Chronic headaches   . Constipation   . Degenerative disc disease, lumbar   . Depression   . Diabetes ([place])    diet controlled, no meds  . Fibromyalgia   . GAD (generalized anxiety disorder) 04/08/2018  .  Insomnia   . Sciatica   . Snoring   . Tendonitis   . TMJ (dislocation of temporomandibular joint)     Family History  Problem Relation Age of Onset  . Hypertension Mother   . Glaucoma Mother   . Cataracts Mother   . Diabetes Father   . Hypertension Father   . Glaucoma Brother   . Ataxia Brother   . Fibromyalgia Daughter   . Migraines Daughter   . Migraines Daughter   . Asthma Daughter   . Breast cancer Maternal Grandfather   . Colon cancer Neg Hx   . Esophageal cancer Neg Hx   . Pancreatic cancer Neg Hx   . Rectal cancer Neg Hx   . Stomach cancer Neg Hx    Past Surgical History:  Procedure Laterality Date  . ABDOMINAL HYSTERECTOMY    . CESAREAN SECTION     x3  . cyst from hand    . DENTAL SURGERY    . KNEE SURGERY     right  . TEMPOROMANDIBULAR JOINT SURGERY    . TONSILLECTOMY     Social History   Social History Narrative  . Not on file   Immunization History  Administered Date(s) Administered  . PFIZER SARS-COV-2 Vaccination 06/17/2019, 07/08/2019  . Tdap 05/11/2016     Objective: Vital Signs: There were no vitals taken for this visit.   Physical Exam   Musculoskeletal Exam: ***  CDAI Exam: CDAI Score: -- Patient Global: --; Provider Global: -- Swollen: --; Tender: -- Joint Exam 01/30/2020   No joint exam has been documented for  this visit   There is currently no information documented on the homunculus. Go to the Rheumatology activity and complete the homunculus joint exam.  Investigation: No additional findings.  Imaging: No results found.  Recent Labs: Lab Results  Component Value Date   WBC 7.7 09/07/2019   HGB 12.7 09/07/2019   PLT 233 09/07/2019   NA 143 12/04/2019   K 4.0 12/04/2019   CL 105 12/04/2019   CO2 24 12/04/2019   GLUCOSE 88 12/04/2019   BUN 16 12/04/2019   CREATININE 0.94 12/04/2019   BILITOT 0.4 12/04/2019   ALKPHOS 116 12/04/2019   AST 19 12/04/2019   ALT 21 12/04/2019   PROT 6.7 12/04/2019   ALBUMIN 4.4  12/04/2019   CALCIUM 9.5 12/04/2019   GFRAA 79 12/04/2019    Speciality Comments: No specialty comments available.  Procedures:  No procedures performed Allergies: Amoxicillin, Doxycycline, and Penicillins   Assessment / Plan:     Visit Diagnoses: No diagnosis found.  Orders: No orders of the defined types were placed in this encounter.  No orders of the defined types were placed in this encounter.   Face-to-face time spent with patient was *** minutes. Greater than 50% of time was spent in counseling and coordination of care.  Follow-Up Instructions: No follow-ups on file.   Earnestine Mealing, CMA  Note - This record has been created using Editor, commissioning.  Chart creation errors have been sought, but may not always  have been located. Such creation errors do not reflect on  the standard of medical care.

## 2020-01-30 ENCOUNTER — Ambulatory Visit: Payer: BC Managed Care – PPO | Admitting: Rheumatology

## 2020-02-20 NOTE — Progress Notes (Signed)
°  Subjective:    Patient ID: [name], female    DOB: 07-20-1965, 54 y.o.   MRN: [id number]  Leatrice Parilla Ellanor Feuerstein is a 54 y.o. female who presents for follow-up of Type 2 diabetes mellitus.   Patient is not checking home blood sugars.   Home blood sugar records: patient does not check sugars How often is blood sugars being checked: not checking due to pain in fingers Current symptoms include: none. Patient denies foot ulcerations, increased appetite, nausea, paresthesia of the feet, visual disturbances, vomiting and weight loss.  Patient is checking their feet daily. Any Foot concerns (callous, ulcer, wound, thickened nails, toenail fungus, skin fungus, hammer toe): none Last dilated eye exam: overdue   Current treatments: doing well on DM meds. Medication compliance: excellent  Current diet: in general, an "unhealthy" diet Current exercise: walking Known diabetic complications: none  The following portions of the patient's history were reviewed and updated as appropriate: allergies, current medications, past medical history, past social history and problem list.  ROS as in subjective above.     Objective:    Physical Exam Alert and in no distress otherwise not examined.  Blood pressure 138/72, pulse 98, weight 182 lb 3.2 oz (82.6 kg).  Lab Review Diabetic Labs Latest Ref Rng & Units 02/21/2020 12/04/2019 10/19/2019 09/07/2019 08/10/2019  HbA1c 4.0 - 5.6 % 6.0(A) - - - 6.1(A)  Microalbumin Not estab mg/dL - - - - -  Micro/Creat Ratio 0 - 29 mg/g creat - - - - -  Chol 100 - 199 mg/dL - 160 181 - -  HDL >39 mg/dL - 66 69 - -  Calc LDL 0 - 99 mg/dL - 83 99 - -  Triglycerides 0 - 149 mg/dL - 52 67 - -  Creatinine 0.57 - 1.00 mg/dL - 0.94 - 0.92 -   BP/Weight 02/21/2020 10/19/2019 08/21/2019 08/10/2019 1/85/6314  Systolic BP 970 263 - 785 885  Diastolic BP 72 70 - 78 69  Wt. (Lbs) 182.2 191.6 188 188.2 190.6  BMI 30.32 31.88 31.28 31.32 33.23   Foot/eye exam  completion dates Latest Ref Rng & Units 10/19/2019 11/05/2018  Eye Exam No Retinopathy - No Retinopathy  Foot Form Completion - Done -    Ahana  reports that she has never smoked. She has never used smokeless tobacco. She reports that she does not drink alcohol and does not use drugs.     Assessment & Plan:    Controlled type 2 diabetes mellitus without complication, without long-term current use of insulin ([place]) - Plan: HgB A1c  Vitamin D deficiency  1. Rx changes: none hemoglobin A1c 6.0% today. 2. Education: Reviewed ABCs of diabetes management (respective goals in parentheses):  A1C (<7), blood pressure (<130/80), and cholesterol (LDL <100). 3. Compliance at present is estimated to be good. Efforts to improve compliance (if necessary) will be directed at dietary modifications: limit sugar and carbohydrates and increased exercise. 4. Continue vitamin D supplement. 5. Reviewed labs from December 04, 2019 and no labs needed today 6. Follow up: 4 months

## 2020-02-21 ENCOUNTER — Ambulatory Visit: Payer: BC Managed Care – PPO | Admitting: Family Medicine

## 2020-02-21 ENCOUNTER — Encounter: Payer: Self-pay | Admitting: Gastroenterology

## 2020-02-21 ENCOUNTER — Other Ambulatory Visit: Payer: Self-pay

## 2020-02-21 ENCOUNTER — Encounter: Payer: Self-pay | Admitting: Family Medicine

## 2020-02-21 VITALS — BP 138/72 | HR 98 | Wt 182.2 lb

## 2020-02-21 DIAGNOSIS — E119 Type 2 diabetes mellitus without complications: Secondary | ICD-10-CM | POA: Diagnosis not present

## 2020-02-21 DIAGNOSIS — E559 Vitamin D deficiency, unspecified: Secondary | ICD-10-CM

## 2020-02-21 DIAGNOSIS — Z23 Encounter for immunization: Secondary | ICD-10-CM | POA: Diagnosis not present

## 2020-02-21 LAB — POCT GLYCOSYLATED HEMOGLOBIN (HGB A1C): Hemoglobin A1C: 6 % — AB (ref 4.0–5.6)

## 2020-02-21 NOTE — Addendum Note (Signed)
Addended by: Minette Headland A on: 02/21/2020 08:57 AM   Modules accepted: Orders

## 2020-02-21 NOTE — Patient Instructions (Signed)
Your hemoglobin A1c is 6.0% and your diabetes is still very well controlled.  Continue with a healthy diet and staying active. I am okay with you checking your blood sugar every other week or so.  Please call and schedule your diabetic eye exam and make sure they send me the report.  I recommend that you call and follow-up with your gastroenterologist, Dr. Loletha Carrow, as recommended.  I will see you back in 4 months or sooner if needed.

## 2020-02-29 ENCOUNTER — Telehealth (INDEPENDENT_AMBULATORY_CARE_PROVIDER_SITE_OTHER): Payer: BC Managed Care – PPO | Admitting: Family Medicine

## 2020-02-29 ENCOUNTER — Other Ambulatory Visit: Payer: Self-pay

## 2020-02-29 ENCOUNTER — Encounter: Payer: Self-pay | Admitting: Family Medicine

## 2020-02-29 VITALS — Temp 96.3°F | Wt 181.0 lb

## 2020-02-29 DIAGNOSIS — J3489 Other specified disorders of nose and nasal sinuses: Secondary | ICD-10-CM | POA: Diagnosis not present

## 2020-02-29 DIAGNOSIS — R059 Cough, unspecified: Secondary | ICD-10-CM | POA: Diagnosis not present

## 2020-02-29 DIAGNOSIS — J040 Acute laryngitis: Secondary | ICD-10-CM | POA: Diagnosis not present

## 2020-02-29 MED ORDER — BENZONATATE 200 MG PO CAPS: 200.0000 mg | ORAL_CAPSULE | Freq: Two times a day (BID) | ORAL | 0 refills | Status: DC | PRN

## 2020-02-29 NOTE — Progress Notes (Signed)
   Subjective:  Documentation for virtual audio and video telecommunications through [place] encounter:  The patient was located at home. 2 patient identifiers used.  The provider was located in the office. The patient did consent to this visit and is aware of possible charges through their insurance for this visit.  The other persons participating in this telemedicine service were none. Time spent on call was 14 minutes and in review of previous records 20 minutes total.  This virtual service is not related to other E/M service within previous 7 days.   Patient ID: [name], female    DOB: 1965-10-21, 54 y.o.   MRN: [id number]  HPI Chief Complaint  Patient presents with  . sick    cough, drainage, runny nose x 4 days. has no voice   Complains of a 4 day history of rhinorrhea, post nasal drainage, laryngitis and cough. Ears are itching.   No fever, chills, body aches,   Taking Sudafed, hot lemon tea.   Covid booster 02/21/2020   Review of Systems Pertinent positives and negatives in the history of present illness.     Objective:   Physical Exam Temp (!) 96.3 F (35.7 C)   Wt 181 lb (82.1 kg)   BMI 30.12 kg/m   Alert and oriented and in no acute distress. Her voice is hoarse. Respirations unlabored.      Assessment & Plan:  Acute laryngitis  Rhinorrhea  Cough - Plan: benzonatate (TESSALON) 200 MG capsule  Discussed that her symptoms appear to be viral and we will treat her symptomatically at this time. I prescribed Tessalon for her. Recommend Mucinex DM. She will hydrate. Salt water gargles for her throat. Follow-up if worsening or if she is not significantly improving in the next 2 to 3 days.

## 2020-03-06 ENCOUNTER — Telehealth: Payer: Self-pay

## 2020-03-06 NOTE — Telephone Encounter (Signed)
P.A. OZEMPIC 

## 2020-03-07 NOTE — Telephone Encounter (Signed)
PA approved, pt informed.

## 2020-03-27 ENCOUNTER — Ambulatory Visit (AMBULATORY_SURGERY_CENTER): Payer: Self-pay

## 2020-03-27 ENCOUNTER — Other Ambulatory Visit: Payer: Self-pay

## 2020-03-27 VITALS — Ht 65.0 in | Wt 183.0 lb

## 2020-03-27 DIAGNOSIS — Z86010 Personal history of colonic polyps: Secondary | ICD-10-CM

## 2020-03-27 MED ORDER — PLENVU 140 G PO SOLR: 1.0000 | ORAL | 0 refills | Status: DC

## 2020-03-27 NOTE — Progress Notes (Signed)
No allergies to soy or egg Pt is not on blood thinners or diet pills Denies issues with sedation/intubation Denies atrial flutter/fib Pt has constipation--goes about twice/wk.  Prep changed to 2-day   Emmi instructions given to pt  Pt is aware of Covid safety and care partner requirements.

## 2020-04-05 ENCOUNTER — Other Ambulatory Visit: Payer: Self-pay | Admitting: Family Medicine

## 2020-04-05 DIAGNOSIS — E119 Type 2 diabetes mellitus without complications: Secondary | ICD-10-CM

## 2020-04-09 ENCOUNTER — Encounter: Payer: Self-pay | Admitting: Family Medicine

## 2020-04-10 ENCOUNTER — Encounter: Payer: Self-pay | Admitting: Family Medicine

## 2020-04-10 ENCOUNTER — Ambulatory Visit (INDEPENDENT_AMBULATORY_CARE_PROVIDER_SITE_OTHER): Payer: BC Managed Care – PPO | Admitting: Family Medicine

## 2020-04-10 ENCOUNTER — Other Ambulatory Visit: Payer: Self-pay

## 2020-04-10 VITALS — BP 110/64 | HR 106 | Temp 98.3°F | Wt 183.4 lb

## 2020-04-10 DIAGNOSIS — S8992XA Unspecified injury of left lower leg, initial encounter: Secondary | ICD-10-CM | POA: Diagnosis not present

## 2020-04-10 DIAGNOSIS — S8012XA Contusion of left lower leg, initial encounter: Secondary | ICD-10-CM | POA: Diagnosis not present

## 2020-04-10 NOTE — Patient Instructions (Signed)
Go to [facility] imaging for an x-ray of your lower leg.  Stop Celebrex and take Tylenol for pain. Use heat to the area.  If you develop any significant redness, increased swelling or pain which could be signs of infection, let me know right away or if we are not open then please go to the urgent care.  Follow-up with me in 1 -2 weeks or sooner if needed.   Hematoma A hematoma is a collection of blood. A hematoma can happen:  Under the skin.  In an organ.  In a body space.  In a joint space.  In other tissues. The blood can thicken (clot) to form a lump that you can see and feel. The lump is often hard and may become sore and tender. The lump can be very small or very big. Most hematomas get better in a few days to weeks. However, some hematomas may be serious and need medical care. What are the causes? This condition is caused by:  An injury.  Blood that leaks under the skin.  Problems from surgeries.  Medical conditions that cause bleeding or bruising. What increases the risk? You are more likely to develop this condition if:  You are an older adult.  You use medicines that thin your blood. What are the signs or symptoms? Symptoms depend on where the hematoma is in your body.  If the hematoma is under the skin, there is: ? A firm lump on the body. ? Pain and tenderness in the area. ? Bruising. The skin above the lump may be blue, dark blue, purple-red, or yellowish.  If the hematoma is deep in the tissues or body spaces, there may be: ? Blood in the stomach. This may cause pain in the belly (abdomen), weakness, passing out (fainting), and shortness of breath. ? Blood in the head. This may cause a headache, weakness, trouble speaking or understanding speech, or passing out. How is this diagnosed? This condition is diagnosed based on:  Your medical history.  A physical exam.  Imaging tests, such as ultrasound or CT scan.  Blood tests. How is this  treated? Treatment depends on the cause, size, and location of the hematoma. Treatment may include:  Doing nothing. Many hematomas go away on their own without treatment.  Surgery or close monitoring. This may be needed for large hematomas or hematomas that affect the body's organs.  Medicines. These may be given if a medical condition caused the hematoma. Follow these instructions at home: Managing pain, stiffness, and swelling   If told, put ice on the area. ? Put ice in a plastic bag. ? Place a towel between your skin and the bag. ? Leave the ice on for 20 minutes, 2-3 times a day for the first two days.  If told, put heat on the affected area after putting ice on the area for two days. Use the heat source that your doctor tells you to use. This could be a moist heat pack or a heating pad. To do this: ? Place a towel between your skin and the heat source. ? Leave the heat on for 20-30 minutes. ? Remove the heat if your skin turns bright red. This is very important if you are unable to feel pain, heat, or cold. You may have a greater risk of getting burned.  Raise (elevate) the affected area above the level of your heart while you are sitting or lying down.  Wrap the affected area with an elastic bandage, if  told by your doctor. Do not wrap the bandage too tightly.  If your hematoma is on a leg or foot and is painful, your doctor may give you crutches. Use them as told by your doctor. General instructions  Take over-the-counter and prescription medicines only as told by your doctor.  Keep all follow-up visits as told by your doctor. This is important. Contact a doctor if:  You have a fever.  The swelling or bruising gets worse.  You start to get more hematomas. Get help right away if:  Your pain gets worse.  Your pain is not getting better with medicine.  Your skin over the hematoma breaks or starts to bleed.  Your hematoma is in your chest or belly and you: ? Pass  out. ? Feel weak. ? Become short of breath.  You have a hematoma on your scalp that is caused by a fall or injury, and you: ? Have a headache that gets worse. ? Have trouble speaking or understanding speech. ? Become less alert or you pass out. Summary  A hematoma is a collection of blood in any part of your body.  Most hematomas get better on their own in a few days to weeks. Some may need medical care.  Follow instructions from your doctor about how to care for your hematoma.  Contact a doctor if the swelling or bruising gets worse, or if you are short of breath. This information is not intended to replace advice given to you by your health care provider. Make sure you discuss any questions you have with your health care provider. Document Revised: 09/09/2017 Document Reviewed: 09/09/2017 Elsevier Patient Education  2020 [employer].

## 2020-04-10 NOTE — Progress Notes (Signed)
° °  Subjective:    Patient ID: [name], female    DOB: 1965-05-24, 54 y.o.   MRN: [id number]  HPI Chief Complaint  Patient presents with   hurt leg with dog    Hurt leg from dog, bruised swollen, and numb on left leg   She is here with bruising, swelling and tenderness of her left anterior lower leg x1 week. Symptoms are gradually improving per patient. States she tripped over her son's dog and that area of her leg landed on a brick. States the swelling has improved. She occasionally has pain when she has been walking for a long time. States the bruising has changed colors and is now yellowish. She also reports a small focal area of numbness which she noticed yesterday.  States the area bled initially and she cleaned it and used antibiotic ointment.  No other injuries or complaints.  She has been taking Celebrex and using ice.  Tdap UTD   No fever, chills, N/V/D  Review of Systems Pertinent positives and negatives in the history of present illness.     Objective:   Physical Exam BP 110/64    Pulse (!) 106    Temp 98.3 F (36.8 C)    Wt 183 lb 6.4 oz (83.2 kg)    BMI 30.52 kg/m   Left knee is normal.  Left anterior lower leg with a large hematoma.  Bruising in different stages of healing including mostly yellowish.  She does have a small scab in the center.  The area is tender to palpation and she reports decreased sensation to a focal area lateral to the hematoma.  No significant erythema, induration or fluctuance.  Left calf is soft and nontender.  Normal ankle and foot exam.  Left lower extremity is neurovascularly intact.      Assessment & Plan:  Hematoma of left lower leg - Plan: DG Tibia/Fibula Left  Injury of left lower extremity, initial encounter - Plan: DG Tibia/Fibula Left  No red flag symptoms.  No sign of infection.  Discussed stages of healing for bruising and hematomas.  I will send her for an x-ray.  Advised her to switch from Celebrex to  Tylenol.  She may use heat.  Discussed symptoms to pay special attention to such as increasing redness, warmth or pain.  If she notices any sign of infection she will let me know or if we are closed over the weekend she will go to an urgent care.  Follow-up with me in 1 to 2 weeks.

## 2020-04-11 ENCOUNTER — Ambulatory Visit
Admission: RE | Admit: 2020-04-11 | Discharge: 2020-04-11 | Disposition: A | Discharge status: Home / Self Care | Payer: BC Managed Care – PPO | Source: Ambulatory Visit | Attending: Family Medicine | Admitting: Family Medicine

## 2020-04-11 ENCOUNTER — Encounter: Payer: BC Managed Care – PPO | Admitting: Gastroenterology

## 2020-04-11 DIAGNOSIS — S8992XA Unspecified injury of left lower leg, initial encounter: Secondary | ICD-10-CM

## 2020-04-11 DIAGNOSIS — M7989 Other specified soft tissue disorders: Secondary | ICD-10-CM | POA: Diagnosis not present

## 2020-04-11 DIAGNOSIS — S8012XA Contusion of left lower leg, initial encounter: Secondary | ICD-10-CM

## 2020-04-24 ENCOUNTER — Ambulatory Visit: Payer: BC Managed Care – PPO | Admitting: Family Medicine

## 2020-04-24 NOTE — Telephone Encounter (Signed)
Please schedule an appointment for further evaluation and recommendations for the pain she is experiencing.

## 2020-04-25 NOTE — Progress Notes (Signed)
Office Visit Note  Patient: [name]             Date of Birth: May 02, 1965           MRN: [id number]             PCP: Girtha Rm, NP-C Referring: Girtha Rm, NP-C Visit Date: 04/29/2020 Occupation: @GUAROCC @  Subjective:  Pain in multiple joints   History of Present Illness: [name] is a 55 y.o. female with history of fibromyalgia and osteoarthritis.  Patient is taking Cymbalta 60 mg 1 capsule by mouth daily and Celebrex 200 mg 1 capsule by mouth daily as needed for pain relief.  She is also tried taking Tylenol but has not noticed much improvement in her symptoms.  She has been experiencing increased generalized myalgias and muscle tenderness due to underlying fibromyalgia.  She has had more frequent flares recently.  She attributes these flares to increased stress at work as well as weather changes.  She presents today with trochanter bursitis of the left hip which has been making it difficult to ambulate.  She is also having discomfort in her left shoulder joint.  She has ongoing pain in both knee joints.  She had Visco gel injections in both knees performed at [facility].  According to the patient she did not have any response to these injections but is not ready to proceed with knee replacement at this time.  Patient reports that she has been having increased paresthesias in both hands.  She has a history of bilateral carpal tunnel syndrome but has not been using carpal tunnel night splints recently.  She uses arthritis compression gloves on occasion.  She has been experiencing increased fatigue which she attributes to not sleeping well at night.  According to the patient she sleeps about 4 hours per night.     Activities of Daily Living:  Patient reports morning stiffness for all day.  Patient Reports nocturnal pain.  Difficulty dressing/grooming: Reports Difficulty climbing stairs: Reports Difficulty getting out of chair:  Reports Difficulty using hands for taps, buttons, cutlery, and/or writing: Reports  Review of Systems  Constitutional: Positive for fatigue.  HENT: Negative for mouth sores, mouth dryness and nose dryness.   Eyes: Negative for pain, itching and dryness.  Respiratory: Positive for shortness of breath and difficulty breathing.   Cardiovascular: Negative for chest pain and palpitations.  Gastrointestinal: Positive for diarrhea. Negative for blood in stool and constipation.  Endocrine: Negative for increased urination.  Genitourinary: Negative for difficulty urinating.  Musculoskeletal: Positive for arthralgias, joint pain, myalgias, morning stiffness, muscle tenderness and myalgias. Negative for joint swelling.  Skin: Negative for color change, rash and redness.  Allergic/Immunologic: Positive for susceptible to infections.  Neurological: Positive for numbness and headaches. Negative for dizziness and memory loss.  Hematological: Positive for bruising/bleeding tendency.  Psychiatric/Behavioral: Positive for decreased concentration and sleep disturbance. Negative for confusion.    PMFS History:  Patient Active Problem List   Diagnosis Date Noted  . Elevated ALT measurement 10/12/2018  . GAD (generalized anxiety disorder) 04/08/2018  . Carpal tunnel syndrome, left upper limb 09/16/2017  . Carpal tunnel syndrome, right upper limb 09/16/2017  . History of migraine 08/24/2017  . Diabetes ([place])   . Vitamin D deficiency 01/08/2017  . Elevated LDL cholesterol level 08/10/2016  . Adenomatous polyps 07/13/2016  . Leg cramp 06/25/2015  . Primary osteoarthritis of both hands 06/25/2015  . Hip bursitis 06/25/2015  . Chronic TMJ  pain 06/25/2015  . Chronic pain syndrome 06/25/2015  . Muscle pain, fibromyalgia 06/25/2015  . Fibromyalgia 06/17/2015    Past Medical History:  Diagnosis Date  . Arthritis   . Arthritis of knee, right   . Bursitis   . Carpal tunnel syndrome   . Chronic headaches    . Constipation   . Degenerative disc disease, lumbar   . Depression   . Diabetes ([place])    diet controlled, no meds  . Fibromyalgia   . GAD (generalized anxiety disorder) 04/08/2018  . Hematoma    left leg, per patient   . Insomnia   . Sciatica   . Snoring   . Tendonitis   . TMJ (dislocation of temporomandibular joint)     Family History  Problem Relation Age of Onset  . Hypertension Mother   . Glaucoma Mother   . Cataracts Mother   . Diabetes Father   . Hypertension Father   . Glaucoma Brother   . Ataxia Brother   . Fibromyalgia Daughter   . Migraines Daughter   . Migraines Daughter   . Asthma Daughter   . Breast cancer Maternal Grandfather   . Colon cancer Neg Hx   . Esophageal cancer Neg Hx   . Pancreatic cancer Neg Hx   . Rectal cancer Neg Hx   . Stomach cancer Neg Hx   . Colon polyps Neg Hx    Past Surgical History:  Procedure Laterality Date  . ABDOMINAL HYSTERECTOMY    . CESAREAN SECTION     x3  . cyst from hand    . DENTAL SURGERY    . KNEE SURGERY     right  . TEMPOROMANDIBULAR JOINT SURGERY    . TONSILLECTOMY     Social History   Social History Narrative  . Not on file   Immunization History  Administered Date(s) Administered  . PFIZER SARS-COV-2 Vaccination 06/17/2019, 07/08/2019, 02/21/2020  . Tdap 05/11/2016     Objective: Vital Signs: BP 121/83 (BP Location: Left Arm, Patient Position: Sitting, Cuff Size: Normal)   Pulse (!) 103   Resp 13   Ht 5' 3.5" (1.613 m)   Wt 185 lb 12.8 oz (84.3 kg)   BMI 32.40 kg/m    Physical Exam Vitals and nursing note reviewed.  Constitutional:      Appearance: She is well-developed and well-nourished.  HENT:     Head: Normocephalic and atraumatic.  Eyes:     Extraocular Movements: EOM normal.     Conjunctiva/sclera: Conjunctivae normal.  Cardiovascular:     Pulses: Intact distal pulses.  Pulmonary:     Effort: Pulmonary effort is normal.  Abdominal:     Palpations: Abdomen is soft.   Musculoskeletal:     Cervical back: Normal range of motion.  Skin:    General: Skin is warm and dry.     Capillary Refill: Capillary refill takes less than 2 seconds.  Neurological:     Mental Status: She is alert and oriented to person, place, and time.  Psychiatric:        Mood and Affect: Mood and affect normal.        Behavior: Behavior normal.      Musculoskeletal Exam: Generalized hyperalgesia and positive tender points.  C-spine, thoracic spine, and lumbar spine good ROM.  Trapezius muscle tension and muscle tenderness bilaterally.  Shoulder joints, elbow joints, wrist joints, MCPs, PIPs, and DIPs good range of motion with no synovitis.  She is able to make a complete  fist bilaterally.  Left hip has painful range of motion.  Tenderness ovation over the left trochanter bursa and IT band.  Right hip has good range of motion with no discomfort.  No tenderness over the right trochanteric bursa.  Knee joints have good range of motion with no warmth or effusion.  Ankle joints have good range of motion with no tenderness or inflammation.  CDAI Exam: CDAI Score: -- Patient Global: --; Provider Global: -- Swollen: --; Tender: -- Joint Exam 04/29/2020   No joint exam has been documented for this visit   There is currently no information documented on the homunculus. Go to the Rheumatology activity and complete the homunculus joint exam.  Investigation: No additional findings.  Imaging: DG Tibia/Fibula Left  Result Date: 04/11/2020 CLINICAL DATA:  Left lower leg pain and swelling for 1 week after injury EXAM: LEFT TIBIA AND FIBULA - 2 VIEW COMPARISON:  None. FINDINGS: There is no evidence of fracture or other focal bone lesions. Focal soft tissue swelling anterior to the proximal tibial diaphysis. IMPRESSION: Focal soft tissue swelling anterior to the proximal tibial diaphysis. No acute fracture or dislocation. Electronically Signed   By: Duanne Guess D.O.   On: 04/11/2020 16:55     Recent Labs: Lab Results  Component Value Date   WBC 7.7 09/07/2019   HGB 12.7 09/07/2019   PLT 233 09/07/2019   NA 143 12/04/2019   K 4.0 12/04/2019   CL 105 12/04/2019   CO2 24 12/04/2019   GLUCOSE 88 12/04/2019   BUN 16 12/04/2019   CREATININE 0.94 12/04/2019   BILITOT 0.4 12/04/2019   ALKPHOS 116 12/04/2019   AST 19 12/04/2019   ALT 21 12/04/2019   PROT 6.7 12/04/2019   ALBUMIN 4.4 12/04/2019   CALCIUM 9.5 12/04/2019   GFRAA 79 12/04/2019    Speciality Comments: No specialty comments available.  Procedures:  Large Joint Inj: L greater trochanter on 04/29/2020 8:53 AM Indications: pain Details: 27 G 1.5 in needle, lateral approach  Arthrogram: No  Medications: 40 mg triamcinolone acetonide 40 MG/ML; 1.5 mL lidocaine 1 % Aspirate: 0 mL Outcome: tolerated well, no immediate complications Procedure, treatment alternatives, risks and benefits explained, specific risks discussed. Consent was given by the patient. Immediately prior to procedure a time out was called to verify the correct patient, procedure, equipment, support staff and site/side marked as required. Patient was prepped and draped in the usual sterile fashion.     Allergies: Amoxicillin, Doxycycline, and Penicillins   Assessment / Plan:     Visit Diagnoses: Fibromyalgia -She has generalized hyperalgesia and positive tender points on exam. She continues to take Cymbalta 60 mg 1 capsule by mouth daily. She has been having more frequent and severe fibromyalgia flares recently. She has been under increased stress at work which she feels has exacerbated her discomfort. She has ongoing trapezius muscle tension and tenderness bilaterally. She presents today with trochanteric bursitis of the left hip. A cortisone injection was performed per request of the patient today in the office.  She tolerated procedure well.  She was provided a note to return to work tomorrow.  A referral to integrative therapies was placed  today.  She was also given a handout of hip bursitis and shoulder joint exercises to perform at home.  She continues to have chronic fatigue secondary to insomnia. She has been sleeping about 4 hours per night. She has interrupted sleep due to nocturnal pain. We discussed the importance of regular exercise and good sleep  hygiene. We discussed the use of melatonin at bedtime. She will continue to take celebrex 200 mg 1 capsule by mouth daily as needed for pain relief. Discussed natural antiinflammatories to start taking. She will follow-up in the office in 6 months.  Plan: Ambulatory referral to Physical Therapy  Trochanteric bursitis of right hip -Resolved. She had a cortisone injection performed on 07/31/2019. She has no tenderness palpation on examination today.  Trochanteric bursitis, left hip -She presents today with trochanter bursitis of the left hip.  She has tenderness palpation over the left trochanteric bursa and IT band.  She has been having difficulty ambulating and lying on her left side at night due to the severity of pain.  She requested a left trochanter bursa cortisone injection.  She tolerated procedure well.  Aftercare was discussed.  Procedure note was completed above.  Referral to integrative therapies was placed today.  Plan: Large Joint Inj: L greater trochanter- Plan: Ambulatory referral to Physical Therapy  Acute pain of right shoulder - Declined x-rays at the last visit. She continues to experience   Primary osteoarthritis of right knee-X-rays of right knee updated on 07/31/19 revealed moderate OA and moderate chondromalacia patella. She has good range of motion of the right knee joint on examination today.  She had a right knee joint cortisone injection performed on 07/31/2019.  She received a series of Visco gel injections at [facility] orthopedics in the past but did not notice any improvement.  She is not ready to proceed with knee replacements at this time.  Chronic pain  syndrome: She has been taking Celebrex 200 mg 1 capsule by mouth daily as needed for pain relief. She has started to have to take Celebrex on a more frequent basis due to the severity of pain she has been experiencing. She is also tried taking Tylenol as needed without much relief.  Other fatigue: Chronic and secondary to insomnia.   Other insomnia: She has been having interrupted sleep at night due to nocturnal pain.  We discussed the importance of good sleep hygiene.  We also discussed the use of melatonin.  Primary osteoarthritis of both hands: She has PIP and DIP thickening consistent with osteoarthritis of both hands. She is able to make a complete fist bilaterally. Joint protection and muscle strengthening were discussed. We discussed the use of natural anti-inflammatories.  DDD (degenerative disc disease), lumbosacral: Chronic pain.  She has no symptoms of radiculopathy at this time.  Chronic right SI joint pain - She had a right SI joint cortisone injection on 01/31/19, which provided significant pain relief.  No tenderness palpation on exam today.  Bilateral carpal tunnel syndrome: She has been experiencing paresthesias in both hands especially at night.  She was encouraged to start wearing carpal tunnel night splints to help alleviate her symptoms.  Other medical conditions are listed as follows  Adenomatous polyps  History of migraine  Vitamin D deficiency: She is taking a daily vitamin D supplement.   History of diabetes mellitus, type II  History of depression - She takes cymbalta 60 mg 1 capsule by mouth daily.     Orders: Orders Placed This Encounter  Procedures  . Large Joint Inj: L greater trochanter  . Ambulatory referral to Physical Therapy   No orders of the defined types were placed in this encounter.    Follow-Up Instructions: Return in about 6 months (around 10/27/2020) for Fibromyalgia, Osteoarthritis, DDD.   Ofilia Neas, PA-C  Note - This record  has  been created using [employer].  Chart creation errors have been sought, but may not always  have been located. Such creation errors do not reflect on  the standard of medical care.

## 2020-04-26 DIAGNOSIS — Z03818 Encounter for observation for suspected exposure to other biological agents ruled out: Secondary | ICD-10-CM | POA: Diagnosis not present

## 2020-04-29 ENCOUNTER — Other Ambulatory Visit: Payer: Self-pay

## 2020-04-29 ENCOUNTER — Ambulatory Visit: Payer: BC Managed Care – PPO | Admitting: Physician Assistant

## 2020-04-29 ENCOUNTER — Encounter: Payer: Self-pay | Admitting: Physician Assistant

## 2020-04-29 VITALS — BP 121/83 | HR 103 | Resp 13 | Ht 63.5 in | Wt 185.8 lb

## 2020-04-29 DIAGNOSIS — M25511 Pain in right shoulder: Secondary | ICD-10-CM

## 2020-04-29 DIAGNOSIS — D369 Benign neoplasm, unspecified site: Secondary | ICD-10-CM

## 2020-04-29 DIAGNOSIS — Z8669 Personal history of other diseases of the nervous system and sense organs: Secondary | ICD-10-CM

## 2020-04-29 DIAGNOSIS — M797 Fibromyalgia: Secondary | ICD-10-CM | POA: Diagnosis not present

## 2020-04-29 DIAGNOSIS — M19042 Primary osteoarthritis, left hand: Secondary | ICD-10-CM

## 2020-04-29 DIAGNOSIS — M533 Sacrococcygeal disorders, not elsewhere classified: Secondary | ICD-10-CM

## 2020-04-29 DIAGNOSIS — G8929 Other chronic pain: Secondary | ICD-10-CM

## 2020-04-29 DIAGNOSIS — M1711 Unilateral primary osteoarthritis, right knee: Secondary | ICD-10-CM | POA: Diagnosis not present

## 2020-04-29 DIAGNOSIS — M7061 Trochanteric bursitis, right hip: Secondary | ICD-10-CM

## 2020-04-29 DIAGNOSIS — M7062 Trochanteric bursitis, left hip: Secondary | ICD-10-CM

## 2020-04-29 DIAGNOSIS — G5603 Carpal tunnel syndrome, bilateral upper limbs: Secondary | ICD-10-CM

## 2020-04-29 DIAGNOSIS — E559 Vitamin D deficiency, unspecified: Secondary | ICD-10-CM

## 2020-04-29 DIAGNOSIS — M51379 Other intervertebral disc degeneration, lumbosacral region without mention of lumbar back pain or lower extremity pain: Secondary | ICD-10-CM

## 2020-04-29 DIAGNOSIS — M19041 Primary osteoarthritis, right hand: Secondary | ICD-10-CM

## 2020-04-29 DIAGNOSIS — R5383 Other fatigue: Secondary | ICD-10-CM

## 2020-04-29 DIAGNOSIS — G894 Chronic pain syndrome: Secondary | ICD-10-CM

## 2020-04-29 DIAGNOSIS — Z8659 Personal history of other mental and behavioral disorders: Secondary | ICD-10-CM

## 2020-04-29 DIAGNOSIS — M5137 Other intervertebral disc degeneration, lumbosacral region: Secondary | ICD-10-CM

## 2020-04-29 DIAGNOSIS — G4709 Other insomnia: Secondary | ICD-10-CM

## 2020-04-29 DIAGNOSIS — Z8639 Personal history of other endocrine, nutritional and metabolic disease: Secondary | ICD-10-CM

## 2020-04-29 MED ORDER — TRIAMCINOLONE ACETONIDE 40 MG/ML IJ SUSP
40.0000 mg | INTRAMUSCULAR | Status: AC | PRN
  Administered 2020-04-29: 40 mg via INTRA_ARTICULAR

## 2020-04-29 MED ORDER — LIDOCAINE HCL 1 % IJ SOLN
1.5000 mL | INTRAMUSCULAR | Status: AC | PRN
  Administered 2020-04-29: 1.5 mL

## 2020-04-29 NOTE — Patient Instructions (Signed)
Shoulder Exercises Ask your health care provider which exercises are safe for you. Do exercises exactly as told by your health care provider and adjust them as directed. It is normal to feel mild stretching, pulling, tightness, or discomfort as you do these exercises. Stop right away if you feel sudden pain or your pain gets worse. Do not begin these exercises until told by your health care provider. Stretching exercises External rotation and abduction This exercise is sometimes called corner stretch. This exercise rotates your arm outward (external rotation) and moves your arm out from your body (abduction). 1. Stand in a doorway with one of your feet slightly in front of the other. This is called a staggered stance. If you cannot reach your forearms to the door frame, stand facing a corner of a room. 2. Choose one of the following positions as told by your health care provider: ? Place your hands and forearms on the door frame above your head. ? Place your hands and forearms on the door frame at the height of your head. ? Place your hands on the door frame at the height of your elbows. 3. Slowly move your weight onto your front foot until you feel a stretch across your chest and in the front of your shoulders. Keep your head and chest upright and keep your abdominal muscles tight. 4. Hold for __________ seconds. 5. To release the stretch, shift your weight to your back foot. Repeat __________ times. Complete this exercise __________ times a day.   Extension, standing 1. Stand and hold a broomstick, a cane, or a similar object behind your back. ? Your hands should be a little wider than shoulder width apart. ? Your palms should face away from your back. 2. Keeping your elbows straight and your shoulder muscles relaxed, move the stick away from your body until you feel a stretch in your shoulders (extension). ? Avoid shrugging your shoulders while you move the stick. Keep your shoulder blades  tucked down toward the middle of your back. 3. Hold for __________ seconds. 4. Slowly return to the starting position. Repeat __________ times. Complete this exercise __________ times a day. Range-of-motion exercises Pendulum 1. Stand near a wall or a surface that you can hold onto for balance. 2. Bend at the waist and let your left / right arm hang straight down. Use your other arm to support you. Keep your back straight and do not lock your knees. 3. Relax your left / right arm and shoulder muscles, and move your hips and your trunk so your left / right arm swings freely. Your arm should swing because of the motion of your body, not because you are using your arm or shoulder muscles. 4. Keep moving your hips and trunk so your arm swings in the following directions, as told by your health care provider: ? Side to side. ? Forward and backward. ? In clockwise and counterclockwise circles. 5. Continue each motion for __________ seconds, or for as long as told by your health care provider. 6. Slowly return to the starting position. Repeat __________ times. Complete this exercise __________ times a day.   Shoulder flexion, standing 1. Stand and hold a broomstick, a cane, or a similar object. Place your hands a little more than shoulder width apart on the object. Your left / right hand should be palm up, and your other hand should be palm down. 2. Keep your elbow straight and your shoulder muscles relaxed. Push the stick up with your  healthy arm to raise your left / right arm in front of your body, and then over your head until you feel a stretch in your shoulder (flexion). ? Avoid shrugging your shoulder while you raise your arm. Keep your shoulder blade tucked down toward the middle of your back. 3. Hold for __________ seconds. 4. Slowly return to the starting position. Repeat __________ times. Complete this exercise __________ times a day.   Shoulder abduction, standing 1. Stand and hold a  broomstick, a cane, or a similar object. Place your hands a little more than shoulder width apart on the object. Your left / right hand should be palm up, and your other hand should be palm down. 2. Keep your elbow straight and your shoulder muscles relaxed. Push the object across your body toward your left / right side. Raise your left / right arm to the side of your body (abduction) until you feel a stretch in your shoulder. ? Do not raise your arm above shoulder height unless your health care provider tells you to do that. ? If directed, raise your arm over your head. ? Avoid shrugging your shoulder while you raise your arm. Keep your shoulder blade tucked down toward the middle of your back. 3. Hold for __________ seconds. 4. Slowly return to the starting position. Repeat __________ times. Complete this exercise __________ times a day. Internal rotation 1. Place your left / right hand behind your back, palm up. 2. Use your other hand to dangle an exercise band, a towel, or a similar object over your shoulder. Grasp the band with your left / right hand so you are holding on to both ends. 3. Gently pull up on the band until you feel a stretch in the front of your left / right shoulder. The movement of your arm toward the center of your body is called internal rotation. ? Avoid shrugging your shoulder while you raise your arm. Keep your shoulder blade tucked down toward the middle of your back. 4. Hold for __________ seconds. 5. Release the stretch by letting go of the band and lowering your hands. Repeat __________ times. Complete this exercise __________ times a day.   Strengthening exercises External rotation 1. Sit in a stable chair without armrests. 2. Secure an exercise band to a stable object at elbow height on your left / right side. 3. Place a soft object, such as a folded towel or a small pillow, between your left / right upper arm and your body to move your elbow about 4 inches (10  cm) away from your side. 4. Hold the end of the exercise band so it is tight and there is no slack. 5. Keeping your elbow pressed against the soft object, slowly move your forearm out, away from your abdomen (external rotation). Keep your body steady so only your forearm moves. 6. Hold for __________ seconds. 7. Slowly return to the starting position. Repeat __________ times. Complete this exercise __________ times a day.   Shoulder abduction 1. Sit in a stable chair without armrests, or stand up. 2. Hold a __________ weight in your left / right hand, or hold an exercise band with both hands. 3. Start with your arms straight down and your left / right palm facing in, toward your body. 4. Slowly lift your left / right hand out to your side (abduction). Do not lift your hand above shoulder height unless your health care provider tells you that this is safe. ? Keep your arms straight. ?  Avoid shrugging your shoulder while you do this movement. Keep your shoulder blade tucked down toward the middle of your back. 5. Hold for __________ seconds. 6. Slowly lower your arm, and return to the starting position. Repeat __________ times. Complete this exercise __________ times a day.   Shoulder extension 1. Sit in a stable chair without armrests, or stand up. 2. Secure an exercise band to a stable object in front of you so it is at shoulder height. 3. Hold one end of the exercise band in each hand. Your palms should face each other. 4. Straighten your elbows and lift your hands up to shoulder height. 5. Step back, away from the secured end of the exercise band, until the band is tight and there is no slack. 6. Squeeze your shoulder blades together as you pull your hands down to the sides of your thighs (extension). Stop when your hands are straight down by your sides. Do not let your hands go behind your body. 7. Hold for __________ seconds. 8. Slowly return to the starting position. Repeat __________  times. Complete this exercise __________ times a day. Shoulder row 1. Sit in a stable chair without armrests, or stand up. 2. Secure an exercise band to a stable object in front of you so it is at waist height. 3. Hold one end of the exercise band in each hand. Position your palms so that your thumbs are facing the ceiling (neutral position). 4. Bend each of your elbows to a 90-degree angle (right angle) and keep your upper arms at your sides. 5. Step back until the band is tight and there is no slack. 6. Slowly pull your elbows back behind you. 7. Hold for __________ seconds. 8. Slowly return to the starting position. Repeat __________ times. Complete this exercise __________ times a day. Shoulder press-ups 1. Sit in a stable chair that has armrests. Sit upright, with your feet flat on the floor. 2. Put your hands on the armrests so your elbows are bent and your fingers are pointing forward. Your hands should be about even with the sides of your body. 3. Push down on the armrests and use your arms to lift yourself off the chair. Straighten your elbows and lift yourself up as much as you comfortably can. ? Move your shoulder blades down, and avoid letting your shoulders move up toward your ears. ? Keep your feet on the ground. As you get stronger, your feet should support less of your body weight as you lift yourself up. 4. Hold for __________ seconds. 5. Slowly lower yourself back into the chair. Repeat __________ times. Complete this exercise __________ times a day.   Wall push-ups 1. Stand so you are facing a stable wall. Your feet should be about one arm-length away from the wall. 2. Lean forward and place your palms on the wall at shoulder height. 3. Keep your feet flat on the floor as you bend your elbows and lean forward toward the wall. 4. Hold for __________ seconds. 5. Straighten your elbows to push yourself back to the starting position. Repeat __________ times. Complete this  exercise __________ times a day.   This information is not intended to replace advice given to you by your health care provider. Make sure you discuss any questions you have with your health care provider. Document Revised: 07/29/2018 Document Reviewed: 05/06/2018 Elsevier Patient Education  2021 [place]. Hip Bursitis Rehab Ask your health care provider which exercises are safe for you. Do exercises exactly  as told by your health care provider and adjust them as directed. It is normal to feel mild stretching, pulling, tightness, or discomfort as you do these exercises. Stop right away if you feel sudden pain or your pain gets worse. Do not begin these exercises until told by your health care provider. Stretching exercise This exercise warms up your muscles and joints and improves the movement and flexibility of your hip. This exercise also helps to relieve pain and stiffness. Iliotibial band stretch An iliotibial band is a strong band of muscle tissue that runs from the outer side of your hip to the outer side of your thigh and knee. 1. Lie on your side with your left / right leg in the top position. 2. Bend your left / right knee and grab your ankle. Stretch out your bottom arm to help you balance. 3. Slowly bring your knee back so your thigh is behind your body. 4. Slowly lower your knee toward the floor until you feel a gentle stretch on the outside of your left / right thigh. If you do not feel a stretch and your knee will not fall farther, place the heel of your other foot on top of your knee and pull your knee down toward the floor with your foot. 5. Hold this position for __________ seconds. 6. Slowly return to the starting position. Repeat __________ times. Complete this exercise __________ times a day.   Strengthening exercises These exercises build strength and endurance in your hip and pelvis. Endurance is the ability to use your muscles for a long time, even after they get  tired. Bridge This exercise strengthens the muscles that move your thigh backward (hip extensors). 1. Lie on your back on a firm surface with your knees bent and your feet flat on the floor. 2. Tighten your buttocks muscles and lift your buttocks off the floor until your trunk is level with your thighs. ? Do not arch your back. ? You should feel the muscles working in your buttocks and the back of your thighs. If you do not feel these muscles, slide your feet 1-2 inches (2.5-5 cm) farther away from your buttocks. ? If this exercise is too easy, try doing it with your arms crossed over your chest. 3. Hold this position for __________ seconds. 4. Slowly lower your hips to the starting position. 5. Let your muscles relax completely after each repetition. Repeat __________ times. Complete this exercise __________ times a day.   Squats This exercise strengthens the muscles in front of your thigh and knee (quadriceps). 1. Stand in front of a table, with your feet and knees pointing straight ahead. You may rest your hands on the table for balance but not for support. 2. Slowly bend your knees and lower your hips like you are going to sit in a chair. ? Keep your weight over your heels, not over your toes. ? Keep your lower legs upright so they are parallel with the table legs. ? Do not let your hips go lower than your knees. ? Do not bend lower than told by your health care provider. ? If your hip pain increases, do not bend as low. 3. Hold the squat position for __________ seconds. 4. Slowly push with your legs to return to standing. Do not use your hands to pull yourself to standing. Repeat __________ times. Complete this exercise __________ times a day. Hip hike 1. Stand sideways on a bottom step. Stand on your left / right leg with  your other foot unsupported next to the step. You can hold on to the railing or wall for balance if needed. 2. Keep your knees straight and your torso square. Then  lift your left / right hip up toward the ceiling. 3. Hold this position for __________ seconds. 4. Slowly let your left / right hip lower toward the floor, past the starting position. Your foot should get closer to the floor. Do not lean or bend your knees. Repeat __________ times. Complete this exercise __________ times a day. Single leg stand 1. Without shoes, stand near a railing or in a doorway. You may hold on to the railing or door frame as needed for balance. 2. Squeeze your left / right buttock muscles, then lift up your other foot. ? Do not let your left / right hip push out to the side. ? It is helpful to stand in front of a mirror for this exercise so you can watch your hip. 3. Hold this position for __________ seconds. Repeat __________ times. Complete this exercise __________ times a day. This information is not intended to replace advice given to you by your health care provider. Make sure you discuss any questions you have with your health care provider. Document Revised: 08/01/2018 Document Reviewed: 08/01/2018 Elsevier Patient Education  [place].

## 2020-05-20 DIAGNOSIS — Z03818 Encounter for observation for suspected exposure to other biological agents ruled out: Secondary | ICD-10-CM | POA: Diagnosis not present

## 2020-06-07 ENCOUNTER — Encounter: Payer: Self-pay | Admitting: Gastroenterology

## 2020-06-07 ENCOUNTER — Other Ambulatory Visit: Payer: Self-pay | Admitting: Family Medicine

## 2020-06-07 DIAGNOSIS — Z1231 Encounter for screening mammogram for malignant neoplasm of breast: Secondary | ICD-10-CM

## 2020-06-20 ENCOUNTER — Other Ambulatory Visit: Payer: Self-pay

## 2020-06-20 ENCOUNTER — Ambulatory Visit (INDEPENDENT_AMBULATORY_CARE_PROVIDER_SITE_OTHER): Payer: BC Managed Care – PPO | Admitting: Family Medicine

## 2020-06-20 ENCOUNTER — Encounter: Payer: Self-pay | Admitting: Family Medicine

## 2020-06-20 VITALS — BP 124/80 | HR 90 | Temp 97.9°F | Wt 184.2 lb

## 2020-06-20 DIAGNOSIS — G4700 Insomnia, unspecified: Secondary | ICD-10-CM

## 2020-06-20 DIAGNOSIS — E7800 Pure hypercholesterolemia, unspecified: Secondary | ICD-10-CM | POA: Diagnosis not present

## 2020-06-20 DIAGNOSIS — E119 Type 2 diabetes mellitus without complications: Secondary | ICD-10-CM | POA: Diagnosis not present

## 2020-06-20 LAB — POCT GLYCOSYLATED HEMOGLOBIN (HGB A1C)
HbA1c POC (<> result, manual entry): 6.1 % (ref 4.0–5.6)
HbA1c, POC (controlled diabetic range): 6.1 % (ref 0.0–7.0)
HbA1c, POC (prediabetic range): 6.1 % (ref 5.7–6.4)
Hemoglobin A1C: 6.1 % — AB (ref 4.0–5.6)

## 2020-06-20 NOTE — Progress Notes (Signed)
Subjective:    Patient ID: [name], female    DOB: May 15, 1965, 55 y.o.   MRN: [id number]  [name] [name] is a 55 y.o. female who presents for follow-up of Type 2 diabetes mellitus.  Reports taking all medications as prescribed including statin and no side effects.  She would like to try stopping Ozempic   States she is having a hard time falling asleep. She exercises before bed and then cannot sleep for several hours but then falls asleep well about 2 hours before her alarm clock goes off. She has her phone in the bed because she gets calls from work. She has tried melatonin.   Patient is checking home blood sugars.   Home blood sugar records: 98-110 How often is blood sugars being checked: once a week Current symptoms include: No. Patient denies no symptoms.  Patient is checking their feet daily. Any Foot concern No Last dilated eye exam: last year and overdue  Current treatments: Ozempic. Medication compliance: Good  Current diet: None Current exercise: mostly walking everyday Known diabetic complications: none  Colonoscopy - rescheduled  Mammogram scheduled   Eye exam is scheduled   The following portions of the patient's history were reviewed and updated as appropriate: allergies, current medications, past medical history, past social history and problem list.  ROS as in subjective above.     Objective:    Physical Exam Alert and in no distress otherwise not examined.  Blood pressure 124/80, pulse 90, temperature 97.9 F (36.6 C), weight 184 lb 3.2 oz (83.6 kg).  Lab Review Diabetic Labs Latest Ref Rng & Units 06/20/2020 06/20/2020 06/20/2020 06/20/2020 02/21/2020  HbA1c 4.0 - 5.6 % 6.1 6.1 6.1 6.1(A) 6.0(A)  Microalbumin Not estab mg/dL - - - - -  Micro/Creat Ratio 0 - 29 mg/g creat - - - - -  Chol 100 - 199 mg/dL - - - - -  HDL >39 mg/dL - - - - -  Calc LDL 0 - 99 mg/dL - - - - -  Triglycerides 0 - 149 mg/dL - - - - -  Creatinine 0.57 -  1.00 mg/dL - - - - -   BP/Weight 06/20/2020 04/29/2020 04/10/2020 03/27/2020 62/13/0865  Systolic BP 784 696 295 - -  Diastolic BP 80 83 64 - -  Wt. (Lbs) 184.2 185.8 183.4 183 181  BMI 32.12 32.4 30.52 30.45 30.12   Foot/eye exam completion dates Latest Ref Rng & Units 10/19/2019 11/05/2018  Eye Exam No Retinopathy - No Retinopathy  Foot Form Completion - Done -    Massiel  reports that she has never smoked. She has never used smokeless tobacco. She reports that she does not drink alcohol and does not use drugs.     Assessment & Plan:    Controlled type 2 diabetes mellitus without complication, without long-term current use of insulin ([place]) - Plan: HgB A1c  Elevated LDL cholesterol level  Insomnia, unspecified type  1. Rx changes: Hgb A1c 6.1%. she has been consistent. Would like to stop Ozempic and see how she does so we will hold this for the next 4 months or start back if her blood sugars are increasing.  2. Education: Reviewed 'ABCs' of diabetes management (respective goals in parentheses):  A1C (<7), blood pressure (<130/80), and cholesterol (LDL <100). 3. Continue statin and losartan.  4. She will get her diabetic eye exam, mammogram and colonoscopy taken care of in the next few weeks.  5. Compliance at present is  estimated to be good. Efforts to improve compliance (if necessary) will be directed at dietary modifications: continue low sugar and low carbohdyrate diet, increased exercise and regular blood sugar monitoring: 2-3 times weekly. 6. Counseling on good sleep hygiene and avoiding exercise before going to bed. Discussed training herself to go to bed to sleep.  7. Follow up: august 2022 for fasting CPE and DM

## 2020-06-28 ENCOUNTER — Other Ambulatory Visit: Payer: Self-pay | Admitting: Family Medicine

## 2020-06-28 NOTE — Telephone Encounter (Signed)
Please advise if pt cymbalta can be filled . Thank [facility]

## 2020-07-01 DIAGNOSIS — H40033 Anatomical narrow angle, bilateral: Secondary | ICD-10-CM | POA: Diagnosis not present

## 2020-07-01 DIAGNOSIS — E119 Type 2 diabetes mellitus without complications: Secondary | ICD-10-CM | POA: Diagnosis not present

## 2020-07-01 LAB — HM DIABETES EYE EXAM

## 2020-07-09 ENCOUNTER — Encounter: Payer: Self-pay | Admitting: Family Medicine

## 2020-07-10 ENCOUNTER — Encounter: Payer: Self-pay | Admitting: Internal Medicine

## 2020-07-15 ENCOUNTER — Encounter: Payer: Self-pay | Admitting: Family Medicine

## 2020-07-15 DIAGNOSIS — H40013 Open angle with borderline findings, low risk, bilateral: Secondary | ICD-10-CM | POA: Diagnosis not present

## 2020-07-15 DIAGNOSIS — I10 Essential (primary) hypertension: Secondary | ICD-10-CM | POA: Diagnosis not present

## 2020-07-15 DIAGNOSIS — H40053 Ocular hypertension, bilateral: Secondary | ICD-10-CM | POA: Diagnosis not present

## 2020-07-15 DIAGNOSIS — E119 Type 2 diabetes mellitus without complications: Secondary | ICD-10-CM | POA: Diagnosis not present

## 2020-07-16 ENCOUNTER — Other Ambulatory Visit: Payer: Self-pay | Admitting: Internal Medicine

## 2020-07-26 ENCOUNTER — Ambulatory Visit
Admission: RE | Admit: 2020-07-26 | Discharge: 2020-07-26 | Disposition: A | Discharge status: Home / Self Care | Payer: BC Managed Care – PPO | Source: Ambulatory Visit | Attending: Family Medicine | Admitting: Family Medicine

## 2020-07-26 ENCOUNTER — Other Ambulatory Visit: Payer: Self-pay | Admitting: Medical

## 2020-07-26 ENCOUNTER — Other Ambulatory Visit: Payer: Self-pay

## 2020-07-26 DIAGNOSIS — Z1231 Encounter for screening mammogram for malignant neoplasm of breast: Secondary | ICD-10-CM | POA: Diagnosis not present

## 2020-07-29 ENCOUNTER — Encounter: Payer: Self-pay | Admitting: Internal Medicine

## 2020-07-29 NOTE — Telephone Encounter (Signed)
I believe this is yours Audelia Acton

## 2020-08-09 DIAGNOSIS — H40053 Ocular hypertension, bilateral: Secondary | ICD-10-CM | POA: Diagnosis not present

## 2020-08-09 DIAGNOSIS — H40013 Open angle with borderline findings, low risk, bilateral: Secondary | ICD-10-CM | POA: Diagnosis not present

## 2020-08-09 DIAGNOSIS — I10 Essential (primary) hypertension: Secondary | ICD-10-CM | POA: Diagnosis not present

## 2020-08-09 DIAGNOSIS — E119 Type 2 diabetes mellitus without complications: Secondary | ICD-10-CM | POA: Diagnosis not present

## 2020-08-19 ENCOUNTER — Other Ambulatory Visit: Payer: Self-pay

## 2020-08-19 ENCOUNTER — Ambulatory Visit (AMBULATORY_SURGERY_CENTER): Payer: Self-pay | Admitting: *Deleted

## 2020-08-19 VITALS — Ht 63.5 in | Wt 189.0 lb

## 2020-08-19 DIAGNOSIS — Z86010 Personal history of colonic polyps: Secondary | ICD-10-CM

## 2020-08-19 MED ORDER — NA SULFATE-K SULFATE-MG SULF 17.5-3.13-1.6 GM/177ML PO SOLN: ORAL | 0 refills | Status: DC

## 2020-08-19 NOTE — Progress Notes (Signed)
Patient is here in-person for PV. Patient denies any allergies to eggs or soy. Patient denies any problems with anesthesia/sedation. Patient denies any oxygen use at home. Patient denies taking any diet/weight loss medications or blood thinners. Patient is not being treated for MRSA or C-diff. Patient is aware of our care-partner policy and GYFVC-[id number] safety protocol. EMMI education assigned to the patient for the procedure, sent to [place].   Patient is fully COVID-19 vaccinated, per patient. Patient denies any constipation, states she is having a BM daily. Pt will do miralax daily x5 days before suprep. Patient denies any medical hx changes since last GI visit.

## 2020-08-28 ENCOUNTER — Encounter: Payer: Self-pay | Admitting: Gastroenterology

## 2020-09-02 ENCOUNTER — Ambulatory Visit (AMBULATORY_SURGERY_CENTER): Payer: BC Managed Care – PPO | Admitting: Gastroenterology

## 2020-09-02 ENCOUNTER — Encounter: Payer: Self-pay | Admitting: Gastroenterology

## 2020-09-02 ENCOUNTER — Other Ambulatory Visit: Payer: Self-pay

## 2020-09-02 VITALS — BP 98/53 | HR 82 | Temp 97.3°F | Resp 12 | Ht 63.5 in | Wt 189.0 lb

## 2020-09-02 DIAGNOSIS — D122 Benign neoplasm of ascending colon: Secondary | ICD-10-CM | POA: Diagnosis not present

## 2020-09-02 DIAGNOSIS — D120 Benign neoplasm of cecum: Secondary | ICD-10-CM

## 2020-09-02 DIAGNOSIS — Z860100 Personal history of colon polyps, unspecified: Secondary | ICD-10-CM

## 2020-09-02 MED ORDER — SODIUM CHLORIDE 0.9 % IV SOLN: 500.0000 mL | Freq: Once | INTRAVENOUS | Status: DC

## 2020-09-02 NOTE — Progress Notes (Signed)
Called to room to assist during endoscopic procedure.  Patient ID and intended procedure confirmed with present staff. Received instructions for my participation in the procedure from the performing physician.  

## 2020-09-02 NOTE — Progress Notes (Signed)
VS-CW  Pt's states no medical or surgical changes since previsit or office visit.  

## 2020-09-02 NOTE — Op Note (Signed)
[place] Patient Name: [name] Procedure Date: 09/02/2020 7:57 AM MRN: [id number] Endoscopist: Mallie Mussel L. Loletha Carrow , MD Age: 55 Referring MD:  Date of Birth: September 28, 1965 Gender: Female Account #: [id number] Procedure:                Colonoscopy Indications:              Surveillance: Personal history of adenomatous                            polyps on last colonoscopy > 3 years ago                           4-5 TA < 90mm ; 06/2016 Medicines:                Monitored Anesthesia Care Procedure:                Pre-Anesthesia Assessment:                           - Prior to the procedure, a History and Physical                            was performed, and patient medications and                            allergies were reviewed. The patient's tolerance of                            previous anesthesia was also reviewed. The risks                            and benefits of the procedure and the sedation                            options and risks were discussed with the patient.                            All questions were answered, and informed consent                            was obtained. Prior Anticoagulants: The patient has                            taken no previous anticoagulant or antiplatelet                            agents. ASA Grade Assessment: II - A patient with                            mild systemic disease. After reviewing the risks                            and benefits, the patient was deemed in  satisfactory condition to undergo the procedure.                           After obtaining informed consent, the colonoscope                            was passed under direct vision. Throughout the                            procedure, the patient's blood pressure, pulse, and                            oxygen saturations were monitored continuously. The                            #[id number] was introduced through the anus and                             advanced to the the cecum, identified by                            appendiceal orifice and ileocecal valve. The                            colonoscopy was somewhat difficult due to a                            redundant colon. Successful completion of the                            procedure was aided by using manual pressure.(adult                            colonoscope for next exam) The patient tolerated                            the procedure well. The quality of the bowel                            preparation was excellent. The ileocecal valve,                            appendiceal orifice, and rectum were photographed.                            The bowel preparation used was SUPREP. Scope In: 8:01:10 AM Scope Out: 8:20:11 AM Scope Withdrawal Time: 0 hours 14 minutes 54 seconds  Total Procedure Duration: 0 hours 19 minutes 1 second  Findings:                 The perianal and digital rectal examinations were                            normal.  Two sessile polyps were found in the distal                            ascending colon and cecum. The polyps were                            diminutive in size. These polyps were removed with                            a cold snare. Resection and retrieval were complete.                           The exam was otherwise without abnormality on                            direct and retroflexion views. Complications:            No immediate complications. Estimated Blood Loss:     Estimated blood loss was minimal. Impression:               - Two diminutive polyps in the distal ascending                            colon and in the cecum, removed with a cold snare.                            Resected and retrieved.                           - The examination was otherwise normal on direct                            and retroflexion views. Recommendation:           - Patient has a contact number  available for                            emergencies. The signs and symptoms of potential                            delayed complications were discussed with the                            patient. Return to normal activities tomorrow.                            Written discharge instructions were provided to the                            patient.                           - Resume previous diet.                           - Continue present medications.                           -  Await pathology results.                           - Repeat colonoscopy is recommended for                            surveillance. The colonoscopy date will be                            determined after pathology results from today's                            exam become available for review. Jaleen Finch L. Loletha Carrow, MD 09/02/2020 8:24:16 AM This report has been signed electronically.

## 2020-09-02 NOTE — Progress Notes (Signed)
Report given to PACU, vss 

## 2020-09-02 NOTE — Patient Instructions (Signed)
YOU HAD AN ENDOSCOPIC PROCEDURE TODAY AT THE [facility]:   Refer to the procedure report that was given to you for any specific questions about what was found during the examination.  If the procedure report does not answer your questions, please call your gastroenterologist to clarify.  If you requested that your care partner not be given the details of your procedure findings, then the procedure report has been included in a sealed envelope for you to review at your convenience later.  YOU SHOULD EXPECT: Some feelings of bloating in the abdomen. Passage of more gas than usual.  Walking can help get rid of the air that was put into your GI tract during the procedure and reduce the bloating. If you had a lower endoscopy (such as a colonoscopy or flexible sigmoidoscopy) you may notice spotting of blood in your stool or on the toilet paper. If you underwent a bowel prep for your procedure, you may not have a normal bowel movement for a few days.  Please Note:  You might notice some irritation and congestion in your nose or some drainage.  This is from the oxygen used during your procedure.  There is no need for concern and it should clear up in a day or so.  SYMPTOMS TO REPORT IMMEDIATELY:   Following lower endoscopy (colonoscopy or flexible sigmoidoscopy):  Excessive amounts of blood in the stool  Significant tenderness or worsening of abdominal pains  Swelling of the abdomen that is new, acute  Fever of 100F or higher   Following upper endoscopy (EGD)  Vomiting of blood or coffee ground material  New chest pain or pain under the shoulder blades  Painful or persistently difficult swallowing  New shortness of breath  Fever of 100F or higher  Black, tarry-looking stools  For urgent or emergent issues, a gastroenterologist can be reached at any hour by calling [phone]. Do not use MyChart messaging for urgent concerns.    DIET:  We do recommend a small meal at first, but  then you may proceed to your regular diet.  Drink plenty of fluids but you should avoid alcoholic beverages for 24 hours.  ACTIVITY:  You should plan to take it easy for the rest of today and you should NOT DRIVE or use heavy machinery until tomorrow (because of the sedation medicines used during the test).    FOLLOW UP: Our staff will call the number listed on your records 48-72 hours following your procedure to check on you and address any questions or concerns that you may have regarding the information given to you following your procedure. If we do not reach you, we will leave a message.  We will attempt to reach you two times.  During this call, we will ask if you have developed any symptoms of COVID 19. If you develop any symptoms (ie: fever, flu-like symptoms, shortness of breath, cough etc.) before then, please call [phone].  If you test positive for Covid 19 in the 2 weeks post procedure, please call and report this information to us.    If any biopsies were taken you will be contacted by phone or by letter within the next 1-3 weeks.  Please call us at [phone] if you have not heard about the biopsies in 3 weeks.    SIGNATURES/CONFIDENTIALITY: You and/or your care partner have signed paperwork which will be entered into your electronic medical record.  These signatures attest to the fact that that the information above on   your After Visit Summary has been reviewed and is understood.  Full responsibility of the confidentiality of this discharge information lies with you and/or your care-partner. 

## 2020-09-04 ENCOUNTER — Telehealth: Payer: Self-pay

## 2020-09-04 ENCOUNTER — Telehealth: Payer: Self-pay | Admitting: *Deleted

## 2020-09-04 NOTE — Telephone Encounter (Signed)
  Follow up Call-  Call back number 09/02/2020  Post procedure Call Back phone  # [phone]  Permission to leave phone message Yes  Some recent data might be hidden     Patient questions:  Do you have a fever, pain , or abdominal swelling? No. Pain Score  0 *  Have you tolerated food without any problems? Yes.    Have you been able to return to your normal activities? Yes.    Do you have any questions about your discharge instructions: Diet   No. Medications  No. Follow up visit  No.  Do you have questions or concerns about your Care? No.  Actions: * If pain score is 4 or above: No action needed, pain <4.  1. Have you developed a fever since your procedure? No   2.   Have you had an respiratory symptoms (SOB or cough) since your procedure? No   3.   Have you tested positive for COVID 19 since your procedure? No   4.   Have you had any family members/close contacts diagnosed with the COVID 19 since your procedure?  No    If yes to any of these questions please route to Joylene John, RN and Joella Prince, RN

## 2020-09-04 NOTE — Telephone Encounter (Signed)
  Follow up Call-  Call back number 09/02/2020  Post procedure Call Back phone  # [phone]  Permission to leave phone message Yes  Some recent data might be hidden     Patient questions:  Message left to call us if necessary.

## 2020-09-09 DIAGNOSIS — U071 COVID-19: Secondary | ICD-10-CM | POA: Diagnosis not present

## 2020-09-09 DIAGNOSIS — Z1152 Encounter for screening for COVID-19: Secondary | ICD-10-CM | POA: Diagnosis not present

## 2020-09-11 ENCOUNTER — Encounter: Payer: Self-pay | Admitting: Gastroenterology

## 2020-09-28 ENCOUNTER — Other Ambulatory Visit: Payer: Self-pay | Admitting: Family Medicine

## 2020-09-28 DIAGNOSIS — E119 Type 2 diabetes mellitus without complications: Secondary | ICD-10-CM

## 2020-10-14 NOTE — Progress Notes (Signed)
Office Visit Note  Patient: [name]             Date of Birth: 1965/12/23           MRN: [id number]             PCP: Girtha Rm, NP-C Referring: Girtha Rm, NP-C Visit Date: 10/28/2020 Occupation: @GUAROCC @  Subjective:  Other (Bilateral hand pain. Patient states that celebrex is no longer working )   History of Present Illness: [name] is a 54 y.o. female with a history of fibromyalgia and osteoarthritis.  She states she has been having pain and discomfort in her left shoulder and having difficulty raising her left arm.  Her right shoulder is better.  She had cortisone injection to her trochanteric bursa at the last visit which is improved.  She continues to have some lower back pain and the SI joint pain.  She states she had right carpal tunnel injection several years ago and has been doing better.  Recently she has flare of her left carpal tunnel syndrome.  She is requesting a cortisone injection for that.  She continues to have some generalized pain and discomfort from fibromyalgia.  She states she has had no relief from combination of Cymbalta and Celebrex recently.  Activities of Daily Living:  Patient reports morning stiffness for 15-20 minutes.   Patient Reports nocturnal pain.  Difficulty dressing/grooming: Reports Difficulty climbing stairs: Reports Difficulty getting out of chair: Denies Difficulty using hands for taps, buttons, cutlery, and/or writing: Reports  Review of Systems  Constitutional:  Negative for fatigue.  HENT:  Negative for mouth sores, mouth dryness and nose dryness.   Eyes:  Negative for pain, itching and dryness.  Respiratory:  Negative for shortness of breath and difficulty breathing.   Cardiovascular:  Negative for chest pain and palpitations.  Gastrointestinal:  Positive for constipation and diarrhea. Negative for blood in stool.  Endocrine: Negative for increased urination.  Genitourinary:  Negative  for difficulty urinating.  Musculoskeletal:  Positive for joint pain, joint pain, joint swelling, myalgias, morning stiffness, muscle tenderness and myalgias.  Skin:  Negative for color change, rash and redness.  Allergic/Immunologic: Negative for susceptible to infections.  Neurological:  Positive for numbness and headaches. Negative for dizziness, memory loss and weakness.  Hematological:  Negative for bruising/bleeding tendency.  Psychiatric/Behavioral:  Negative for confusion.    PMFS History:  Patient Active Problem List   Diagnosis Date Noted   Elevated ALT measurement 10/12/2018   GAD (generalized anxiety disorder) 04/08/2018   Carpal tunnel syndrome, left upper limb 09/16/2017   Carpal tunnel syndrome, right upper limb 09/16/2017   History of migraine 08/24/2017   Diabetes ([place])    Vitamin D deficiency 01/08/2017   Elevated LDL cholesterol level 08/10/2016   Adenomatous polyps 07/13/2016   Leg cramp 06/25/2015   Primary osteoarthritis of both hands 06/25/2015   Hip bursitis 06/25/2015   Chronic TMJ pain 06/25/2015   Chronic pain syndrome 06/25/2015   Muscle pain, fibromyalgia 06/25/2015   Fibromyalgia 06/17/2015    Past Medical History:  Diagnosis Date   Arthritis    Arthritis of knee, right    Bursitis    Carpal tunnel syndrome    Chronic headaches    Constipation    Degenerative disc disease, lumbar    Depression    Diabetes ([place])    diet controlled, no meds   Eye pressure    Fibromyalgia    GAD (generalized anxiety  disorder) 04/08/2018   Hematoma    left leg, per patient    Insomnia    Sciatica    Snoring    Tendonitis    TMJ (dislocation of temporomandibular joint)     Family History  Problem Relation Age of Onset   Hypertension Mother    Glaucoma Mother    Cataracts Mother    Diabetes Father    Hypertension Father    Glaucoma Brother    Ataxia Brother    Fibromyalgia Daughter    Migraines Daughter    Migraines Daughter    Asthma Daughter     Breast cancer Maternal Grandfather    Colon cancer Neg Hx    Esophageal cancer Neg Hx    Pancreatic cancer Neg Hx    Rectal cancer Neg Hx    Stomach cancer Neg Hx    Colon polyps Neg Hx    Past Surgical History:  Procedure Laterality Date   ABDOMINAL HYSTERECTOMY     CESAREAN SECTION     x3   cyst from hand     DENTAL SURGERY     KNEE SURGERY     right   TEMPOROMANDIBULAR JOINT SURGERY     TONSILLECTOMY     Social History   Social History Narrative   Not on file   Immunization History  Administered Date(s) Administered   PFIZER(Purple Top)SARS-COV-2 Vaccination 06/17/2019, 07/08/2019, 02/21/2020   Tdap 05/11/2016     Objective: Vital Signs: BP 132/83 (BP Location: Right Arm, Patient Position: Sitting, Cuff Size: Normal)   Pulse 82   Resp 14   Ht 5' 3.5" (1.613 m)   Wt 190 lb 12.8 oz (86.5 kg)   BMI 33.27 kg/m    Physical Exam Vitals and nursing note reviewed.  Constitutional:      Appearance: She is well-developed.  HENT:     Head: Normocephalic and atraumatic.  Eyes:     Conjunctiva/sclera: Conjunctivae normal.  Cardiovascular:     Rate and Rhythm: Normal rate and regular rhythm.     Heart sounds: Normal heart sounds.  Pulmonary:     Effort: Pulmonary effort is normal.     Breath sounds: Normal breath sounds.  Abdominal:     General: Bowel sounds are normal.     Palpations: Abdomen is soft.  Musculoskeletal:     Cervical back: Normal range of motion.  Lymphadenopathy:     Cervical: No cervical adenopathy.  Skin:    General: Skin is warm and dry.     Capillary Refill: Capillary refill takes less than 2 seconds.  Neurological:     Mental Status: She is alert and oriented to person, place, and time.  Psychiatric:        Behavior: Behavior normal.     Musculoskeletal Exam: C-spine was in good range of motion.  Right shoulder joint was in good range of motion.  Left shoulder joint had abduction and forward flexion limited to about 45 degrees.  Wrist  joints, MCPs, PIPs and DIPs with good range of motion with no synovitis.  She had left hand Phalen's and Tinel's positive.  Hip joints were in good range of motion.  She has some discomfort in her right knee joint.  There is no tenderness over trochanteric bursa.  There is no tenderness over ankles or MTPs.  CDAI Exam: CDAI Score: -- Patient Global: --; Provider Global: -- Swollen: --; Tender: -- Joint Exam 10/28/2020   No joint exam has been documented for this visit  There is currently no information documented on the homunculus. Go to the Rheumatology activity and complete the homunculus joint exam.  Investigation: No additional findings.  Imaging: No results found.  Recent Labs: Lab Results  Component Value Date   WBC 7.7 09/07/2019   HGB 12.7 09/07/2019   PLT 233 09/07/2019   NA 143 12/04/2019   K 4.0 12/04/2019   CL 105 12/04/2019   CO2 24 12/04/2019   GLUCOSE 88 12/04/2019   BUN 16 12/04/2019   CREATININE 0.94 12/04/2019   BILITOT 0.4 12/04/2019   ALKPHOS 116 12/04/2019   AST 19 12/04/2019   ALT 21 12/04/2019   PROT 6.7 12/04/2019   ALBUMIN 4.4 12/04/2019   CALCIUM 9.5 12/04/2019   GFRAA 79 12/04/2019    Speciality Comments: No specialty comments available.  Procedures:  Large Joint Inj: L glenohumeral on 10/28/2020 10:22 AM Indications: pain Details: 27 G 1.5 in needle, posterior approach  Arthrogram: No  Medications: 40 mg triamcinolone acetonide 40 MG/ML; 1.5 mL lidocaine 1 % Aspirate: 0 mL Outcome: tolerated well, no immediate complications Procedure, treatment alternatives, risks and benefits explained, specific risks discussed. Consent was given by the patient. Immediately prior to procedure a time out was called to verify the correct patient, procedure, equipment, support staff and site/side marked as required. Patient was prepped and draped in the usual sterile fashion.    Hand/UE Inj: L carpal tunnel for carpal tunnel syndrome on 10/28/2020 10:25  AM Indications: pain Details: 27 G needle, ultrasound-guided ulnar approach Medications: 0.5 mL lidocaine 1 %; 10 mg triamcinolone acetonide 40 MG/ML Aspirate: 0 mL Outcome: tolerated well, no immediate complications Procedure, treatment alternatives, risks and benefits explained, specific risks discussed. Consent was given by the patient. Immediately prior to procedure a time out was called to verify the correct patient, procedure, equipment, support staff and site/side marked as required. Patient was prepped and draped in the usual sterile fashion.    Allergies: Amoxicillin, Doxycycline, and Penicillins   Assessment / Plan:     Visit Diagnoses: Fibromyalgia -she continues to have some pain and discomfort from fibromyalgia.  Need for regular exercise was emphasized.  Cymbalta 60 mg 1 capsule by mouth daily  Chronic pain of both shoulders-she has discomfort in her both shoulders for a while.  She states the right shoulder joint discomfort improved.  She had limited painful range of motion of her left shoulder joint today.  X-ray of the left shoulder joint was obtained today which was unremarkable.  After informed consent was obtained left shoulder joint was injected with cortisone as described above.  She tolerated the procedure well.  Postprocedure instructions were given.  A handout on exercises was given.  Primary osteoarthritis of both hands-she is osteoarthritis in her bilateral hands.  Joint protection was discussed.  Bilateral carpal tunnel syndrome-she gives history of bilateral postural syndrome.  She has had cortisone injection in her right hand few years ago which was helpful.  She requests having carpal tunnel injection for the left hand.  Per patient's request left carpal tunnel injection was performed which is described above.  She was advised to use carpal tunnel brace.  Trochanteric bursitis of both hips - Resolved. She had a cortisone injection performed on 07/31/2019.  She had  good range of motion of her bilateral hips without discomfort.  Primary osteoarthritis of right knee - X-rays of right knee updated on 07/31/19 revealed moderate OA and moderate chondromalacia patella.  She had painful range of motion of her right  knee joint.  She states she had recent meniscal tear.  DDD (degenerative disc disease), lumbosacral-she continues to have some chronic lower back pain.  Chronic right SI joint pain - She had a right SI joint cortisone injection on 01/31/19, which provided significant pain relief.  She had no right SI joint pain today.  Medication monitoring encounter -she is on long-term Celebrex.  I will check labs today.  Plan: CBC with Differential/Platelet, COMPLETE METABOLIC PANEL WITH GFR  Chronic pain syndrome - Celebrex 200 mg 1 capsule by mouth daily as needed for pain relief.  Other fatigue  Other insomnia-good sleep hygiene was discussed.  History of migraine  Adenomatous polyps  History of diabetes mellitus, type II-she has been advised to monitor blood sugar closely.  She states her blood sugar has been running normal.  Vitamin D deficiency  History of depression  Orders: Orders Placed This Encounter  Procedures   CBC with Differential/Platelet   COMPLETE METABOLIC PANEL WITH GFR    No orders of the defined types were placed in this encounter.   Follow-Up Instructions: Return for Osteoarthritis, FMS.   Bo Merino, MD  Note - This record has been created using Editor, commissioning.  Chart creation errors have been sought, but may not always  have been located. Such creation errors do not reflect on  the standard of medical care.

## 2020-10-23 ENCOUNTER — Encounter: Payer: BC Managed Care – PPO | Admitting: Family Medicine

## 2020-10-28 ENCOUNTER — Encounter: Payer: Self-pay | Admitting: Rheumatology

## 2020-10-28 ENCOUNTER — Ambulatory Visit: Payer: Self-pay

## 2020-10-28 ENCOUNTER — Other Ambulatory Visit: Payer: Self-pay

## 2020-10-28 ENCOUNTER — Ambulatory Visit: Payer: BC Managed Care – PPO | Admitting: Rheumatology

## 2020-10-28 VITALS — BP 132/83 | HR 82 | Resp 14 | Ht 63.5 in | Wt 190.8 lb

## 2020-10-28 DIAGNOSIS — E559 Vitamin D deficiency, unspecified: Secondary | ICD-10-CM

## 2020-10-28 DIAGNOSIS — G8929 Other chronic pain: Secondary | ICD-10-CM

## 2020-10-28 DIAGNOSIS — M7061 Trochanteric bursitis, right hip: Secondary | ICD-10-CM

## 2020-10-28 DIAGNOSIS — Z8639 Personal history of other endocrine, nutritional and metabolic disease: Secondary | ICD-10-CM

## 2020-10-28 DIAGNOSIS — M19041 Primary osteoarthritis, right hand: Secondary | ICD-10-CM | POA: Diagnosis not present

## 2020-10-28 DIAGNOSIS — D369 Benign neoplasm, unspecified site: Secondary | ICD-10-CM

## 2020-10-28 DIAGNOSIS — G5603 Carpal tunnel syndrome, bilateral upper limbs: Secondary | ICD-10-CM | POA: Diagnosis not present

## 2020-10-28 DIAGNOSIS — R5383 Other fatigue: Secondary | ICD-10-CM

## 2020-10-28 DIAGNOSIS — M25512 Pain in left shoulder: Secondary | ICD-10-CM

## 2020-10-28 DIAGNOSIS — Z5181 Encounter for therapeutic drug level monitoring: Secondary | ICD-10-CM | POA: Diagnosis not present

## 2020-10-28 DIAGNOSIS — G894 Chronic pain syndrome: Secondary | ICD-10-CM

## 2020-10-28 DIAGNOSIS — G4709 Other insomnia: Secondary | ICD-10-CM

## 2020-10-28 DIAGNOSIS — M1711 Unilateral primary osteoarthritis, right knee: Secondary | ICD-10-CM

## 2020-10-28 DIAGNOSIS — M19042 Primary osteoarthritis, left hand: Secondary | ICD-10-CM

## 2020-10-28 DIAGNOSIS — M51379 Other intervertebral disc degeneration, lumbosacral region without mention of lumbar back pain or lower extremity pain: Secondary | ICD-10-CM

## 2020-10-28 DIAGNOSIS — M797 Fibromyalgia: Secondary | ICD-10-CM | POA: Diagnosis not present

## 2020-10-28 DIAGNOSIS — M25511 Pain in right shoulder: Secondary | ICD-10-CM

## 2020-10-28 DIAGNOSIS — Z8669 Personal history of other diseases of the nervous system and sense organs: Secondary | ICD-10-CM

## 2020-10-28 DIAGNOSIS — M533 Sacrococcygeal disorders, not elsewhere classified: Secondary | ICD-10-CM

## 2020-10-28 DIAGNOSIS — M7062 Trochanteric bursitis, left hip: Secondary | ICD-10-CM

## 2020-10-28 DIAGNOSIS — Z8659 Personal history of other mental and behavioral disorders: Secondary | ICD-10-CM

## 2020-10-28 DIAGNOSIS — M5137 Other intervertebral disc degeneration, lumbosacral region: Secondary | ICD-10-CM

## 2020-10-28 MED ORDER — LIDOCAINE HCL 1 % IJ SOLN
0.5000 mL | INTRAMUSCULAR | Status: AC | PRN
  Administered 2020-10-28: .5 mL

## 2020-10-28 MED ORDER — TRIAMCINOLONE ACETONIDE 40 MG/ML IJ SUSP
10.0000 mg | INTRAMUSCULAR | Status: AC | PRN
  Administered 2020-10-28: 10 mg

## 2020-10-28 MED ORDER — TRIAMCINOLONE ACETONIDE 40 MG/ML IJ SUSP
40.0000 mg | INTRAMUSCULAR | Status: AC | PRN
  Administered 2020-10-28: 40 mg via INTRA_ARTICULAR

## 2020-10-28 MED ORDER — LIDOCAINE HCL 1 % IJ SOLN
1.5000 mL | INTRAMUSCULAR | Status: AC | PRN
  Administered 2020-10-28: 1.5 mL

## 2020-10-28 NOTE — Patient Instructions (Signed)
Shoulder Exercises Ask your health care provider which exercises are safe for you. Do exercises exactly as told by your health care provider and adjust them as directed. It is normal to feel mild stretching, pulling, tightness, or discomfort as you do these exercises. Stop right away if you feel sudden pain or your pain gets worse. Do not begin these exercises until told by your health care provider. Stretching exercises External rotation and abduction This exercise is sometimes called corner stretch. This exercise rotates your arm outward (external rotation) and moves your arm out from your body (abduction). Stand in a doorway with one of your feet slightly in front of the other. This is called a staggered stance. If you cannot reach your forearms to the door frame, stand facing a corner of a room. Choose one of the following positions as told by your health care provider: Place your hands and forearms on the door frame above your head. Place your hands and forearms on the door frame at the height of your head. Place your hands on the door frame at the height of your elbows. Slowly move your weight onto your front foot until you feel a stretch across your chest and in the front of your shoulders. Keep your head and chest upright and keep your abdominal muscles tight. Hold for __________ seconds. To release the stretch, shift your weight to your back foot. Repeat __________ times. Complete this exercise __________ times a day. Extension, standing Stand and hold a broomstick, a cane, or a similar object behind your back. Your hands should be a little wider than shoulder width apart. Your palms should face away from your back. Keeping your elbows straight and your shoulder muscles relaxed, move the stick away from your body until you feel a stretch in your shoulders (extension). Avoid shrugging your shoulders while you move the stick. Keep your shoulder blades tucked down toward the middle of your  back. Hold for __________ seconds. Slowly return to the starting position. Repeat __________ times. Complete this exercise __________ times a day. Range-of-motion exercises Pendulum  Stand near a wall or a surface that you can hold onto for balance. Bend at the waist and let your left / right arm hang straight down. Use your other arm to support you. Keep your back straight and do not lock your knees. Relax your left / right arm and shoulder muscles, and move your hips and your trunk so your left / right arm swings freely. Your arm should swing because of the motion of your body, not because you are using your arm or shoulder muscles. Keep moving your hips and trunk so your arm swings in the following directions, as told by your health care provider: Side to side. Forward and backward. In clockwise and counterclockwise circles. Continue each motion for __________ seconds, or for as long as told by your health care provider. Slowly return to the starting position. Repeat __________ times. Complete this exercise __________ times a day. Shoulder flexion, standing  Stand and hold a broomstick, a cane, or a similar object. Place your hands a little more than shoulder width apart on the object. Your left / right hand should be palm up, and your other hand should be palm down. Keep your elbow straight and your shoulder muscles relaxed. Push the stick up with your healthy arm to raise your left / right arm in front of your body, and then over your head until you feel a stretch in your shoulder (flexion). Avoid   shrugging your shoulder while you raise your arm. Keep your shoulder blade tucked down toward the middle of your back. Hold for __________ seconds. Slowly return to the starting position. Repeat __________ times. Complete this exercise __________ times a day. Shoulder abduction, standing Stand and hold a broomstick, a cane, or a similar object. Place your hands a little more than shoulder  width apart on the object. Your left / right hand should be palm up, and your other hand should be palm down. Keep your elbow straight and your shoulder muscles relaxed. Push the object across your body toward your left / right side. Raise your left / right arm to the side of your body (abduction) until you feel a stretch in your shoulder. Do not raise your arm above shoulder height unless your health care provider tells you to do that. If directed, raise your arm over your head. Avoid shrugging your shoulder while you raise your arm. Keep your shoulder blade tucked down toward the middle of your back. Hold for __________ seconds. Slowly return to the starting position. Repeat __________ times. Complete this exercise __________ times a day. Internal rotation  Place your left / right hand behind your back, palm up. Use your other hand to dangle an exercise band, a towel, or a similar object over your shoulder. Grasp the band with your left / right hand so you are holding on to both ends. Gently pull up on the band until you feel a stretch in the front of your left / right shoulder. The movement of your arm toward the center of your body is called internal rotation. Avoid shrugging your shoulder while you raise your arm. Keep your shoulder blade tucked down toward the middle of your back. Hold for __________ seconds. Release the stretch by letting go of the band and lowering your hands. Repeat __________ times. Complete this exercise __________ times a day. Strengthening exercises External rotation  Sit in a stable chair without armrests. Secure an exercise band to a stable object at elbow height on your left / right side. Place a soft object, such as a folded towel or a small pillow, between your left / right upper arm and your body to move your elbow about 4 inches (10 cm) away from your side. Hold the end of the exercise band so it is tight and there is no slack. Keeping your elbow pressed  against the soft object, slowly move your forearm out, away from your abdomen (external rotation). Keep your body steady so only your forearm moves. Hold for __________ seconds. Slowly return to the starting position. Repeat __________ times. Complete this exercise __________ times a day. Shoulder abduction  Sit in a stable chair without armrests, or stand up. Hold a __________ weight in your left / right hand, or hold an exercise band with both hands. Start with your arms straight down and your left / right palm facing in, toward your body. Slowly lift your left / right hand out to your side (abduction). Do not lift your hand above shoulder height unless your health care provider tells you that this is safe. Keep your arms straight. Avoid shrugging your shoulder while you do this movement. Keep your shoulder blade tucked down toward the middle of your back. Hold for __________ seconds. Slowly lower your arm, and return to the starting position. Repeat __________ times. Complete this exercise __________ times a day. Shoulder extension Sit in a stable chair without armrests, or stand up. Secure an exercise band   to a stable object in front of you so it is at shoulder height. Hold one end of the exercise band in each hand. Your palms should face each other. Straighten your elbows and lift your hands up to shoulder height. Step back, away from the secured end of the exercise band, until the band is tight and there is no slack. Squeeze your shoulder blades together as you pull your hands down to the sides of your thighs (extension). Stop when your hands are straight down by your sides. Do not let your hands go behind your body. Hold for __________ seconds. Slowly return to the starting position. Repeat __________ times. Complete this exercise __________ times a day. Shoulder row Sit in a stable chair without armrests, or stand up. Secure an exercise band to a stable object in front of you so it  is at waist height. Hold one end of the exercise band in each hand. Position your palms so that your thumbs are facing the ceiling (neutral position). Bend each of your elbows to a 90-degree angle (right angle) and keep your upper arms at your sides. Step back until the band is tight and there is no slack. Slowly pull your elbows back behind you. Hold for __________ seconds. Slowly return to the starting position. Repeat __________ times. Complete this exercise __________ times a day. Shoulder press-ups  Sit in a stable chair that has armrests. Sit upright, with your feet flat on the floor. Put your hands on the armrests so your elbows are bent and your fingers are pointing forward. Your hands should be about even with the sides of your body. Push down on the armrests and use your arms to lift yourself off the chair. Straighten your elbows and lift yourself up as much as you comfortably can. Move your shoulder blades down, and avoid letting your shoulders move up toward your ears. Keep your feet on the ground. As you get stronger, your feet should support less of your body weight as you lift yourself up. Hold for __________ seconds. Slowly lower yourself back into the chair. Repeat __________ times. Complete this exercise __________ times a day. Wall push-ups  Stand so you are facing a stable wall. Your feet should be about one arm-length away from the wall. Lean forward and place your palms on the wall at shoulder height. Keep your feet flat on the floor as you bend your elbows and lean forward toward the wall. Hold for __________ seconds. Straighten your elbows to push yourself back to the starting position. Repeat __________ times. Complete this exercise __________ times a day. This information is not intended to replace advice given to you by your health care provider. Make sure you discuss any questions you have with your healthcare provider. Document Revised: 07/29/2018 Document  Reviewed: 05/06/2018 Elsevier Patient Education  2022 [employer]  

## 2020-10-29 LAB — COMPLETE METABOLIC PANEL WITH GFR
AG Ratio: 2 (calc) (ref 1.0–2.5)
ALT: 17 U/L (ref 6–29)
AST: 17 U/L (ref 10–35)
Albumin: 4.7 g/dL (ref 3.6–5.1)
Alkaline phosphatase (APISO): 104 U/L (ref 37–153)
BUN: 17 mg/dL (ref 7–25)
CO2: 29 mmol/L (ref 20–32)
Calcium: 10 mg/dL (ref 8.6–10.4)
Chloride: 104 mmol/L (ref 98–110)
Creat: 0.89 mg/dL (ref 0.50–1.03)
Globulin: 2.4 g/dL (calc) (ref 1.9–3.7)
Glucose, Bld: 101 mg/dL — ABNORMAL HIGH (ref 65–99)
Potassium: 4.4 mmol/L (ref 3.5–5.3)
Sodium: 140 mmol/L (ref 135–146)
Total Bilirubin: 0.6 mg/dL (ref 0.2–1.2)
Total Protein: 7.1 g/dL (ref 6.1–8.1)
eGFR: 76 mL/min/{1.73_m2} (ref >=60)

## 2020-10-29 LAB — CBC WITH DIFFERENTIAL/PLATELET
Absolute Monocytes: 421 cells/uL (ref 200–950)
Basophils Absolute: 18 cells/uL (ref 0–200)
Basophils Relative: 0.3 %
Eosinophils Absolute: 73 cells/uL (ref 15–500)
Eosinophils Relative: 1.2 %
HCT: 41.2 % (ref 35.0–45.0)
Hemoglobin: 13.4 g/dL (ref 11.7–15.5)
Lymphs Abs: 2172 cells/uL (ref 850–3900)
MCH: 28.5 pg (ref 27.0–33.0)
MCHC: 32.5 g/dL (ref 32.0–36.0)
MCV: 87.5 fL (ref 80.0–100.0)
MPV: 11.7 fL (ref 7.5–12.5)
Monocytes Relative: 6.9 %
Neutro Abs: 3416 cells/uL (ref 1500–7800)
Neutrophils Relative %: 56 %
Platelets: 224 10*3/uL (ref 140–400)
RBC: 4.71 10*6/uL (ref 3.80–5.10)
RDW: 13.3 % (ref 11.0–15.0)
Total Lymphocyte: 35.6 %
WBC: 6.1 10*3/uL (ref 3.8–10.8)

## 2020-10-30 NOTE — Progress Notes (Signed)
CBC is normal, CMP is normal except glucose is mildly elevated which is nonsignificant.

## 2020-12-24 ENCOUNTER — Encounter: Payer: Self-pay | Admitting: Family Medicine

## 2020-12-24 ENCOUNTER — Other Ambulatory Visit: Payer: Self-pay

## 2020-12-24 ENCOUNTER — Ambulatory Visit: Payer: BC Managed Care – PPO | Admitting: Family Medicine

## 2020-12-24 VITALS — BP 124/80 | HR 106 | Temp 98.6°F | Ht 63.25 in | Wt 189.8 lb

## 2020-12-24 DIAGNOSIS — Z0000 Encounter for general adult medical examination without abnormal findings: Secondary | ICD-10-CM

## 2020-12-24 DIAGNOSIS — E1169 Type 2 diabetes mellitus with other specified complication: Secondary | ICD-10-CM | POA: Diagnosis not present

## 2020-12-24 DIAGNOSIS — E785 Hyperlipidemia, unspecified: Secondary | ICD-10-CM | POA: Diagnosis not present

## 2020-12-24 DIAGNOSIS — E119 Type 2 diabetes mellitus without complications: Secondary | ICD-10-CM

## 2020-12-24 DIAGNOSIS — Z563 Stressful work schedule: Secondary | ICD-10-CM | POA: Insufficient documentation

## 2020-12-24 DIAGNOSIS — H9191 Unspecified hearing loss, right ear: Secondary | ICD-10-CM

## 2020-12-24 DIAGNOSIS — E669 Obesity, unspecified: Secondary | ICD-10-CM | POA: Diagnosis not present

## 2020-12-24 DIAGNOSIS — Z2821 Immunization not carried out because of patient refusal: Secondary | ICD-10-CM

## 2020-12-24 NOTE — Patient Instructions (Addendum)
You will hear from the audiologist. Let me know if you do not.  Continue your medications.   We will be in touch with your lab results.   Take time for yourself.    Preventive Care 57-57 Years Old, Female Preventive care refers to lifestyle choices and visits with your health care provider that can promote health and wellness. This includes: A yearly physical exam. This is also called an annual wellness visit. Regular dental and eye exams. Immunizations. Screening for certain conditions. Healthy lifestyle choices, such as: Eating a healthy diet. Getting regular exercise. Not using drugs or products that contain nicotine and tobacco. Limiting alcohol use. What can I expect for my preventive care visit? Physical exam Your health care provider will check your: Height and weight. These may be used to calculate your BMI (body mass index). BMI is a measurement that tells if you are at a healthy weight. Heart rate and blood pressure. Body temperature. Skin for abnormal spots. Counseling Your health care provider may ask you questions about your: Past medical problems. Family's medical history. Alcohol, tobacco, and drug use. Emotional well-being. Home life and relationship well-being. Sexual activity. Diet, exercise, and sleep habits. Work and work Statistician. Access to firearms. Method of birth control. Menstrual cycle. Pregnancy history. What immunizations do I need? Vaccines are usually given at various ages, according to a schedule. Your health care provider will recommend vaccines for you based on your age, medical history, and lifestyle or other factors, such as travel or where you work. What tests do I need? Blood tests Lipid and cholesterol levels. These may be checked every 5 years, or more often if you are over 76 years old. Hepatitis C test. Hepatitis B test. Screening Lung cancer screening. You may have this screening every year starting at age 44 if you have a  30-pack-year history of smoking and currently smoke or have quit within the past 15 years. Colorectal cancer screening. All adults should have this screening starting at age 74 and continuing until age 69. Your health care provider may recommend screening at age 33 if you are at increased risk. You will have tests every 1-10 years, depending on your results and the type of screening test. Diabetes screening. This is done by checking your blood sugar (glucose) after you have not eaten for a while (fasting). You may have this done every 1-3 years. Mammogram. This may be done every 1-2 years. Talk with your health care provider about when you should start having regular mammograms. This may depend on whether you have a family history of breast cancer. BRCA-related cancer screening. This may be done if you have a family history of breast, ovarian, tubal, or peritoneal cancers. Pelvic exam and Pap test. This may be done every 3 years starting at age 45. Starting at age 37, this may be done every 5 years if you have a Pap test in combination with an HPV test. Other tests STD (sexually transmitted disease) testing, if you are at risk. Bone density scan. This is done to screen for osteoporosis. You may have this scan if you are at high risk for osteoporosis. Talk with your health care provider about your test results, treatment options, and if necessary, the need for more tests. Follow these instructions at home: Eating and drinking  Eat a diet that includes fresh fruits and vegetables, whole grains, lean protein, and low-fat dairy products. Take vitamin and mineral supplements as recommended by your health care provider. Do not drink alcohol  if: Your health care provider tells you not to drink. You are pregnant, may be pregnant, or are planning to become pregnant. If you drink alcohol: Limit how much you have to 0-1 drink a day. Be aware of how much alcohol is in your drink. In the [place] one  drink equals one 12 oz bottle of beer (355 mL), one 5 oz glass of wine (148 mL), or one 1 oz glass of hard liquor (44 mL). Lifestyle Take daily care of your teeth and gums. Brush your teeth every morning and night with fluoride toothpaste. Floss one time each day. Stay active. Exercise for at least 30 minutes 5 or more days each week. Do not use any products that contain nicotine or tobacco, such as cigarettes, e-cigarettes, and chewing tobacco. If you need help quitting, ask your health care provider. Do not use drugs. If you are sexually active, practice safe sex. Use a condom or other form of protection to prevent STIs (sexually transmitted infections). If you do not wish to become pregnant, use a form of birth control. If you plan to become pregnant, see your health care provider for a prepregnancy visit. If told by your health care provider, take low-dose aspirin daily starting at age 3. Find healthy ways to cope with stress, such as: Meditation, yoga, or listening to music. Journaling. Talking to a trusted person. Spending time with friends and family. Safety Always wear your seat belt while driving or riding in a vehicle. Do not drive: If you have been drinking alcohol. Do not ride with someone who has been drinking. When you are tired or distracted. While texting. Wear a helmet and other protective equipment during sports activities. If you have firearms in your house, make sure you follow all gun safety procedures. What's next? Visit your health care provider once a year for an annual wellness visit. Ask your health care provider how often you should have your eyes and teeth checked. Stay up to date on all vaccines. This information is not intended to replace advice given to you by your health care provider. Make sure you discuss any questions you have with your health care provider. Document Revised: 06/14/2020 Document Reviewed: 12/16/2017 Elsevier Patient Education  2022  [employer].

## 2020-12-24 NOTE — Progress Notes (Signed)
Subjective:    Patient ID: [name], female    DOB: February 05, 1966, 55 y.o.   MRN: [id number]  HPI Chief Complaint  Patient presents with   Annual Exam    CPE no other issues, no obgyn, eye doctor yearly [facility] [place], Dentist yearly. Pt. Did not want flu shot.    She is here for a complete physical exam.  Complains of decreased hearing in her right ear for several weeks/months.   States she saw a retina specialist, no glaucoma.   Diabetes - Hgb A1c has been in prediabetes range for years. Is not on medication currently.   Reports taking her statin daily   Sees specialists for fibromyalgia and chronic pain.   Recently has taken on more responsibilities at work.    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   Reviewed allergies, medications, past medical, surgical, family, and social history.    Review of Systems Review of Systems Constitutional: -fever, -chills, -sweats, -unexpected weight change,-fatigue ENT: -runny nose, -ear pain, -sore throat Cardiology:  -chest pain, -palpitations, -edema Respiratory: -cough, -shortness of breath, -wheezing Gastroenterology: -abdominal pain, -nausea, -vomiting, -diarrhea, -constipation  Hematology: -bleeding or bruising problems Musculoskeletal: -arthralgias, -myalgias, -joint swelling, -back pain Ophthalmology: -vision changes Urology: -dysuria, -difficulty urinating, -hematuria, -urinary  frequency, -urgency Neurology: -headache, -weakness, -tingling, -numbness       Objective:   Physical Exam BP 124/80   Pulse (!) 106   Temp 98.6 F (37 C)   Ht 5' 3.25" (1.607 m)   Wt 189 lb 12.8 oz (86.1 kg)   BMI 33.36 kg/m   General Appearance:    Alert, cooperative, no distress, appears stated age  Head:    Normocephalic, without obvious abnormality, atraumatic  Eyes:    PERRL, conjunctiva/corneas clear, EOM's intact  Ears:    Normal TM's and external ear canals  Nose:   Mask on   Throat:   Mask on   Neck:   Supple, no lymphadenopathy;  thyroid:  no   enlargement/tenderness/nodules; no JVD  Back:    Spine nontender, no curvature, ROM normal, no CVA     tenderness  Lungs:     Clear to auscultation bilaterally without wheezes, rales or     ronchi; respirations unlabored  Chest Wall:    No tenderness or deformity   Heart:    Regular rate and rhythm, S1 and S2 normal, no murmur, rub   or gallop  Breast Exam:    Refuses   Abdomen:     Soft, non-tender, nondistended, normoactive bowel sounds,    no masses, no hepatosplenomegaly  Genitalia:    Refuses      Extremities:   No clubbing, cyanosis or edema  Pulses:   2+ and symmetric all extremities  Skin:   Skin color, texture, turgor normal, no rashes or lesions  Lymph nodes:   Cervical, supraclavicular, and axillary nodes normal  Neurologic:   CNII-XII intact, normal strength, sensation and gait  Psych:   Normal mood, affect, hygiene and grooming.         Assessment & Plan:  Routine general medical examination at a health care facility - Plan: CBC with Differential/Platelet, Comprehensive metabolic panel, TSH, T4, free -Preventive health care reviewed.  She is up-to-date on mammogram and colonoscopy.  Declines pelvic exam today and discussed the importance of continuing these.  Counseling on healthy lifestyle including diet, exercise and stress reduction.  She is up-to-date on dental and eye exams.  Immunizations  reviewed.  Discussed safety and health promotion.  Type 2 diabetes mellitus in remission ([place]) - Plan: TSH, T4, free, Hemoglobin A1c -Previous hemoglobin A1c's have been in prediabetes range.  She is not on medication.  Managing with diet and exercise.  Check hemoglobin A1c and other labs and follow-up. Diabetic eye exam is up-to-date.  Hyperlipidemia associated with type 2 diabetes mellitus ([place]) - Plan: Lipid panel -Continue statin therapy.  Follow-up pending lipid panel.  Recommend low-fat diet  Obesity (BMI 30-39.9) - Plan: TSH, T4, free, Lipid panel, Hemoglobin A1c -She has been quite busy recently and unable to find time to exercise.  Discussed eating a healthy, well-balanced diet and not skipping meals.  Stressful work schedule -Encouraged her to prioritize and look for ways to cut back.  Encouraged her to talk to her employer.  Hearing loss of right ear, unspecified hearing loss type - Plan: Ambulatory referral to Audiology -Severe hearing loss in the right ear.  Refer to audiology.  Immunization declined -Declines flu vaccine, pneumonia vaccine and shingles for now.  She will get the COVID booster in the next few weeks when the new booster is released.

## 2020-12-25 ENCOUNTER — Encounter: Payer: Self-pay | Admitting: Family Medicine

## 2020-12-27 ENCOUNTER — Other Ambulatory Visit: Payer: Self-pay | Admitting: Family Medicine

## 2020-12-27 LAB — COMPREHENSIVE METABOLIC PANEL
ALT: 14 IU/L (ref 0–32)
AST: 17 IU/L (ref 0–40)
Albumin/Globulin Ratio: 2 (ref 1.2–2.2)
Albumin: 4.6 g/dL (ref 3.8–4.9)
Alkaline Phosphatase: 118 IU/L (ref 44–121)
BUN/Creatinine Ratio: 19 (ref 9–23)
BUN: 17 mg/dL (ref 6–24)
Bilirubin Total: 0.4 mg/dL (ref 0.0–1.2)
CO2: 22 mmol/L (ref 20–29)
Calcium: 9.6 mg/dL (ref 8.7–10.2)
Chloride: 102 mmol/L (ref 96–106)
Creatinine, Ser: 0.9 mg/dL (ref 0.57–1.00)
Globulin, Total: 2.3 g/dL (ref 1.5–4.5)
Glucose: 99 mg/dL (ref 65–99)
Potassium: 4.1 mmol/L (ref 3.5–5.2)
Sodium: 142 mmol/L (ref 134–144)
Total Protein: 6.9 g/dL (ref 6.0–8.5)
eGFR: 75 mL/min/{1.73_m2} (ref >59)

## 2020-12-27 LAB — CBC WITH DIFFERENTIAL/PLATELET
Basophils Absolute: 0 10*3/uL (ref 0.0–0.2)
Basos: 1 %
EOS (ABSOLUTE): 0.1 10*3/uL (ref 0.0–0.4)
Eos: 2 %
Hematocrit: 39.9 % (ref 34.0–46.6)
Hemoglobin: 13.3 g/dL (ref 11.1–15.9)
Immature Grans (Abs): 0 10*3/uL (ref 0.0–0.1)
Immature Granulocytes: 0 %
Lymphocytes Absolute: 2.3 10*3/uL (ref 0.7–3.1)
Lymphs: 34 %
MCH: 28.9 pg (ref 26.6–33.0)
MCHC: 33.3 g/dL (ref 31.5–35.7)
MCV: 87 fL (ref 79–97)
Monocytes Absolute: 0.5 10*3/uL (ref 0.1–0.9)
Monocytes: 7 %
Neutrophils Absolute: 3.9 10*3/uL (ref 1.4–7.0)
Neutrophils: 56 %
Platelets: 241 10*3/uL (ref 150–450)
RBC: 4.6 x10E6/uL (ref 3.77–5.28)
RDW: 13.9 % (ref 11.7–15.4)
WBC: 6.9 10*3/uL (ref 3.4–10.8)

## 2020-12-27 LAB — T4, FREE: Free T4: 0.91 ng/dL (ref 0.82–1.77)

## 2020-12-27 LAB — LIPID PANEL
Chol/HDL Ratio: 2.7 ratio (ref 0.0–4.4)
Cholesterol, Total: 200 mg/dL — ABNORMAL HIGH (ref 100–199)
HDL: 74 mg/dL (ref >39)
LDL Chol Calc (NIH): 115 mg/dL — ABNORMAL HIGH (ref 0–99)
Triglycerides: 61 mg/dL (ref 0–149)
VLDL Cholesterol Cal: 11 mg/dL (ref 5–40)

## 2020-12-27 LAB — HEMOGLOBIN A1C
Est. average glucose Bld gHb Est-mCnc: 140 mg/dL
Hgb A1c MFr Bld: 6.5 % — ABNORMAL HIGH (ref 4.8–5.6)

## 2020-12-27 LAB — TSH: TSH: 0.753 u[IU]/mL (ref 0.450–4.500)

## 2020-12-27 MED ORDER — ROSUVASTATIN CALCIUM 20 MG PO TABS: 20.0000 mg | ORAL_TABLET | Freq: Every day | ORAL | 1 refills | Status: DC

## 2020-12-28 ENCOUNTER — Other Ambulatory Visit: Payer: Self-pay | Admitting: Family Medicine

## 2020-12-28 DIAGNOSIS — E119 Type 2 diabetes mellitus without complications: Secondary | ICD-10-CM

## 2020-12-30 NOTE — Telephone Encounter (Signed)
With this refill pharmacy is asking for a dx code-currently associated with her diabetes, is this correct?

## 2021-01-08 DIAGNOSIS — H9071 Mixed conductive and sensorineural hearing loss, unilateral, right ear, with unrestricted hearing on the contralateral side: Secondary | ICD-10-CM | POA: Diagnosis not present

## 2021-02-07 ENCOUNTER — Other Ambulatory Visit: Payer: Self-pay | Admitting: Medical

## 2021-02-13 DIAGNOSIS — H918X9 Other specified hearing loss, unspecified ear: Secondary | ICD-10-CM | POA: Insufficient documentation

## 2021-02-13 DIAGNOSIS — H918X3 Other specified hearing loss, bilateral: Secondary | ICD-10-CM | POA: Insufficient documentation

## 2021-03-02 ENCOUNTER — Telehealth: Payer: Self-pay

## 2021-03-02 NOTE — Telephone Encounter (Signed)
Received renewal for P.A. OZEMPIC, pt is currently not taking

## 2021-03-08 ENCOUNTER — Other Ambulatory Visit: Payer: Self-pay | Admitting: Rheumatology

## 2021-03-10 NOTE — Telephone Encounter (Signed)
Next Visit: 04/28/2021  Last Visit: 10/28/2020  Last Fill: 01/09/2020  DX: Chronic pain syndrome   Current Dose per office note 10/28/2020: Celebrex 200 mg 1 capsule by mouth daily   Labs: 12/24/2020 WNL  Okay to refill Celebrex?

## 2021-04-02 NOTE — Progress Notes (Signed)
Office Visit Note  Patient: [name]             Date of Birth: March 21, 1966           MRN: [id number]             PCP: Marcellina Millin Referring: Marcellina Millin Visit Date: 04/03/2021 Occupation: @GUAROCC @  Subjective:  Left hand numbness and tingling   History of Present Illness: Zenora Karpel is a 55 y.o. female with history of osteoarthritis.  She states she continues to have pain and tingling in her left hand due to carpal tunnel syndrome.  She does not want to have surgery.  She wants to have an injection for carpal tunnel syndrome.  She has pain and stiffness in her bilateral hands due to underlying osteoarthritis.  The pain in her shoulder joints has resolved.  She denies any SI joint pain.  She had good response to left glenohumeral joint injection.  She is off-and-on discomfort in the right trochanteric area.  She has ongoing pain and discomfort in her right knee joint.  For which she has been followed at [facility].  She has been taking Celebrex 200 mg p.o. daily.  She also takes Advil over-the-counter.  She has been using a brace on her left wrist.  Activities of Daily Living:  Patient reports morning stiffness for 20-30 minutes.   Patient Reports nocturnal pain.  Difficulty dressing/grooming: Reports Difficulty climbing stairs: Reports Difficulty getting out of chair: Reports Difficulty using hands for taps, buttons, cutlery, and/or writing: Reports  Review of Systems  Constitutional:  Positive for fatigue. Negative for night sweats, weight gain and weight loss.  HENT:  Negative for mouth sores, trouble swallowing, trouble swallowing, mouth dryness and nose dryness.   Eyes:  Negative for pain, redness, itching, visual disturbance and dryness.  Respiratory:  Negative for cough, shortness of breath and difficulty breathing.   Cardiovascular:  Negative for chest pain, palpitations, hypertension, irregular heartbeat  and swelling in legs/feet.  Gastrointestinal:  Positive for constipation and diarrhea. Negative for blood in stool.  Endocrine: Negative for increased urination.  Genitourinary:  Negative for difficulty urinating and vaginal dryness.  Musculoskeletal:  Positive for joint pain, joint pain, joint swelling, myalgias, morning stiffness, muscle tenderness and myalgias. Negative for muscle weakness.  Skin:  Negative for color change, rash, hair loss, redness, skin tightness, ulcers and sensitivity to sunlight.  Allergic/Immunologic: Positive for susceptible to infections.  Neurological:  Positive for dizziness, numbness, headaches and weakness. Negative for memory loss and night sweats.  Hematological:  Positive for bruising/bleeding tendency. Negative for swollen glands.  Psychiatric/Behavioral:  Negative for depressed mood, confusion and sleep disturbance. The patient is not nervous/anxious.    PMFS History:  Patient Active Problem List   Diagnosis Date Noted   Hyperlipidemia associated with type 2 diabetes mellitus ([place]) 12/24/2020   Stressful work schedule 12/24/2020   Elevated ALT measurement 10/12/2018   GAD (generalized anxiety disorder) 04/08/2018   Carpal tunnel syndrome, left upper limb 09/16/2017   Carpal tunnel syndrome, right upper limb 09/16/2017   History of migraine 08/24/2017   Type 2 diabetes mellitus in remission ([place])    Vitamin D deficiency 01/08/2017   Elevated LDL cholesterol level 08/10/2016   Adenomatous polyps 07/13/2016   Obesity (BMI 30-39.9) 05/11/2016   Leg cramp 06/25/2015   Primary osteoarthritis of both hands 06/25/2015   Hip bursitis 06/25/2015   Chronic TMJ pain 06/25/2015   Chronic  pain syndrome 06/25/2015   Muscle pain, fibromyalgia 06/25/2015   Fibromyalgia 06/17/2015    Past Medical History:  Diagnosis Date   Arthritis    Arthritis of knee, right    Bursitis    Carpal tunnel syndrome    Chronic headaches    Constipation    Degenerative disc  disease, lumbar    Depression    Diabetes (HCC)    diet controlled, no meds   Eye pressure    Fibromyalgia    GAD (generalized anxiety disorder) 04/08/2018   Hematoma    left leg, per patient    Insomnia    Sciatica    Snoring    Tendonitis    TMJ (dislocation of temporomandibular joint)     Family History  Problem Relation Age of Onset   Hypertension Mother    Glaucoma Mother    Cataracts Mother    Diabetes Father    Hypertension Father    Glaucoma Brother    Ataxia Brother    Fibromyalgia Daughter    Migraines Daughter    Migraines Daughter    Asthma Daughter    Breast cancer Maternal Grandfather    Colon cancer Neg Hx    Esophageal cancer Neg Hx    Pancreatic cancer Neg Hx    Rectal cancer Neg Hx    Stomach cancer Neg Hx    Colon polyps Neg Hx    Past Surgical History:  Procedure Laterality Date   ABDOMINAL HYSTERECTOMY     CESAREAN SECTION     x3   cyst from hand     DENTAL SURGERY     KNEE SURGERY     right   TEMPOROMANDIBULAR JOINT SURGERY     TONSILLECTOMY     Social History   Social History Narrative   Not on file   Immunization History  Administered Date(s) Administered   PFIZER(Purple Top)SARS-COV-2 Vaccination 06/17/2019, 07/08/2019, 02/21/2020   Tdap 05/11/2016     Objective: Vital Signs: BP 130/84 (BP Location: Right Arm, Patient Position: Sitting, Cuff Size: Large)    Pulse 80    Ht 5' 3.5" (1.613 m)    Wt 201 lb (91.2 kg)    BMI 35.05 kg/m    Physical Exam Vitals and nursing note reviewed.  Constitutional:      Appearance: She is well-developed.  HENT:     Head: Normocephalic and atraumatic.  Eyes:     Conjunctiva/sclera: Conjunctivae normal.  Cardiovascular:     Rate and Rhythm: Normal rate and regular rhythm.     Heart sounds: Normal heart sounds.  Pulmonary:     Effort: Pulmonary effort is normal.     Breath sounds: Normal breath sounds.  Abdominal:     General: Bowel sounds are normal.     Palpations: Abdomen is soft.   Musculoskeletal:     Cervical back: Normal range of motion.  Lymphadenopathy:     Cervical: No cervical adenopathy.  Skin:    General: Skin is warm and dry.     Capillary Refill: Capillary refill takes less than 2 seconds.  Neurological:     Mental Status: She is alert and oriented to person, place, and time.  Psychiatric:        Behavior: Behavior normal.     Musculoskeletal Exam: C-spine was in good range of motion.  Shoulder joints and elbow joints in good range of motion.  She had no tenderness on palpation over wrist joints MCPs or PIPs.  She had bilateral PIP  and DIP thickening.  Left wrist Phalen's and Tinel's test are positive.  Manual compression test was positive.  Hip joints with good range of motion.  She had discomfort with range of motion of her right knee joint.  There is no tenderness over ankles or MTPs.  CDAI Exam: CDAI Score: -- Patient Global: --; Provider Global: -- Swollen: --; Tender: -- Joint Exam 04/03/2021   No joint exam has been documented for this visit   There is currently no information documented on the homunculus. Go to the Rheumatology activity and complete the homunculus joint exam.  Investigation: No additional findings.  Imaging: No results found.  Recent Labs: Lab Results  Component Value Date   WBC 6.9 12/24/2020   HGB 13.3 12/24/2020   PLT 241 12/24/2020   NA 142 12/24/2020   K 4.1 12/24/2020   CL 102 12/24/2020   CO2 22 12/24/2020   GLUCOSE 99 12/24/2020   BUN 17 12/24/2020   CREATININE 0.90 12/24/2020   BILITOT 0.4 12/24/2020   ALKPHOS 118 12/24/2020   AST 17 12/24/2020   ALT 14 12/24/2020   PROT 6.9 12/24/2020   ALBUMIN 4.6 12/24/2020   CALCIUM 9.6 12/24/2020   GFRAA 79 12/04/2019    Speciality Comments: No specialty comments available.  Procedures:   Ultrasound guided injection is preferred based studies that show increased duration, increased effect, greater accuracy, decreased procedural pain, increased response  rate, and decreased cost with ultrasound guided versus blind injection.   Verbal informed consent obtained.  Time-out conducted.  Noted no overlying erythema, induration, or other signs of local infection. Ultrasound-guided left carpal tunnel injection: After sterile prep with Betadine, injected 0.5 mL of 1% lidocaine and 10 mg Kenalog using a 27-gauge needle, needle placement was confirmed in the left median nerve sheath.    Hand/UE Inj: L carpal tunnel for carpal tunnel syndrome on 04/03/2021 1:21 PM Indications: pain Details: 27 G needle, ultrasound-guided ulnar approach Medications: 0.5 mL lidocaine 1 %; 10 mg triamcinolone acetonide 40 MG/ML Aspirate: 0 mL Outcome: tolerated well, no immediate complications Procedure, treatment alternatives, risks and benefits explained, specific risks discussed. Consent was given by the patient. Immediately prior to procedure a time out was called to verify the correct patient, procedure, equipment, support staff and site/side marked as required. Patient was prepped and draped in the usual sterile fashion.    Allergies: Amoxicillin, Doxycycline, and Penicillins   Assessment / Plan:     Visit Diagnoses: Pain in left wrist -patient complains of left wrist pain and numbness in her left first second and third digit.  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  She has been using a carpal tunnel brace.  She had good response to previous carpal tunnel injection.  She requested repeat carpal tunnel injection today.  Plan: US Guided Needle Placement  Bilateral carpal tunnel syndrome -she experiences pain and tingling in her left first second and third fingers.  She states the pain radiates up to her forearm.  After informed consent was obtained and different treatment options were discussed and left carpal tunnel injection was performed as described above.  She tolerated the procedure well.  Postprocedure instructions were given.  Plan: US Guided  Needle Placement  Primary osteoarthritis of both hands-joint protection muscle strengthening was discussed.  Trochanteric bursitis of both hips - Resolved. She had a cortisone injection performed on 07/31/2019.   Primary osteoarthritis of right knee - X-rays of right knee updated on 07/31/19 revealed moderate OA  and moderate chondromalacia patella.Followed at [facility]  DDD (degenerative disc disease), lumbosacral-she has off-and-on discomfort in her lower back.  Core strength exercises were discussed.  Medication monitoring encounter  Fibromyalgia - Cymbalta 60 mg 1 capsule by mouth daily  Chronic pain syndrome - Celebrex 200 mg 1 capsule by mouth daily as needed for pain relief.  She is also using ibuprofen over-the-counter.  I discouraged increased use of NSAIDs.  Increased risk of gastric ulcer, GI bleed, hepatic and renal toxicity was  discussed.  Lab work from December 24, 2020 CBC and CMP were normal.  Lab findings were discussed.  Other fatigue-need for regular exercise was emphasized.  Fatigue is most likely related to chronic insomnia.  Other insomnia-good sleep hygiene was discussed.  History of diabetes mellitus, type II-patient states that she has been monitoring her blood sugar and has been within normal limits.  Vitamin D deficiency  History of migraine  Adenomatous polyps  History of depression  Orders: Orders Placed This Encounter  Procedures   Hand/UE Inj   US Guided Needle Placement   No orders of the defined types were placed in this encounter.  Total time spent patient was 30 minutes.  More than 50% time was spent in counseling and coordination of care.  Follow-Up Instructions: Return for Osteoarthritis.   Bo Merino, MD  Note - This record has been created using Editor, commissioning.  Chart creation errors have been sought, but may not always  have been located. Such creation errors do not reflect on  the standard of medical care.

## 2021-04-03 ENCOUNTER — Other Ambulatory Visit: Payer: Self-pay

## 2021-04-03 ENCOUNTER — Ambulatory Visit (INDEPENDENT_AMBULATORY_CARE_PROVIDER_SITE_OTHER): Payer: 59 | Admitting: Rheumatology

## 2021-04-03 ENCOUNTER — Encounter: Payer: Self-pay | Admitting: Rheumatology

## 2021-04-03 ENCOUNTER — Ambulatory Visit: Payer: Self-pay

## 2021-04-03 VITALS — BP 130/84 | HR 80 | Ht 63.5 in | Wt 201.0 lb

## 2021-04-03 DIAGNOSIS — M7061 Trochanteric bursitis, right hip: Secondary | ICD-10-CM

## 2021-04-03 DIAGNOSIS — M19042 Primary osteoarthritis, left hand: Secondary | ICD-10-CM

## 2021-04-03 DIAGNOSIS — M19041 Primary osteoarthritis, right hand: Secondary | ICD-10-CM

## 2021-04-03 DIAGNOSIS — D369 Benign neoplasm, unspecified site: Secondary | ICD-10-CM

## 2021-04-03 DIAGNOSIS — G4709 Other insomnia: Secondary | ICD-10-CM

## 2021-04-03 DIAGNOSIS — G8929 Other chronic pain: Secondary | ICD-10-CM

## 2021-04-03 DIAGNOSIS — G894 Chronic pain syndrome: Secondary | ICD-10-CM

## 2021-04-03 DIAGNOSIS — M1711 Unilateral primary osteoarthritis, right knee: Secondary | ICD-10-CM

## 2021-04-03 DIAGNOSIS — M797 Fibromyalgia: Secondary | ICD-10-CM

## 2021-04-03 DIAGNOSIS — E559 Vitamin D deficiency, unspecified: Secondary | ICD-10-CM

## 2021-04-03 DIAGNOSIS — M25532 Pain in left wrist: Secondary | ICD-10-CM

## 2021-04-03 DIAGNOSIS — Z8639 Personal history of other endocrine, nutritional and metabolic disease: Secondary | ICD-10-CM

## 2021-04-03 DIAGNOSIS — G5603 Carpal tunnel syndrome, bilateral upper limbs: Secondary | ICD-10-CM | POA: Diagnosis not present

## 2021-04-03 DIAGNOSIS — M7062 Trochanteric bursitis, left hip: Secondary | ICD-10-CM

## 2021-04-03 DIAGNOSIS — Z8659 Personal history of other mental and behavioral disorders: Secondary | ICD-10-CM

## 2021-04-03 DIAGNOSIS — Z5181 Encounter for therapeutic drug level monitoring: Secondary | ICD-10-CM

## 2021-04-03 DIAGNOSIS — R5383 Other fatigue: Secondary | ICD-10-CM

## 2021-04-03 DIAGNOSIS — Z8669 Personal history of other diseases of the nervous system and sense organs: Secondary | ICD-10-CM

## 2021-04-03 DIAGNOSIS — M5137 Other intervertebral disc degeneration, lumbosacral region: Secondary | ICD-10-CM

## 2021-04-03 MED ORDER — TRIAMCINOLONE ACETONIDE 40 MG/ML IJ SUSP
10.0000 mg | INTRAMUSCULAR | Status: AC | PRN
  Administered 2021-04-03: 10 mg

## 2021-04-03 MED ORDER — LIDOCAINE HCL 1 % IJ SOLN
0.5000 mL | INTRAMUSCULAR | Status: AC | PRN
  Administered 2021-04-03: .5 mL

## 2021-04-15 ENCOUNTER — Other Ambulatory Visit: Payer: Self-pay | Admitting: Physician Assistant

## 2021-04-15 NOTE — Progress Notes (Deleted)
Office Visit Note  Patient: [name]             Date of Birth: 02-01-1966           MRN: [id number]             PCP: Marcellina Millin Referring: Davy Pique Visit Date: 04/28/2021 Occupation: @GUAROCC @  Subjective:  No chief complaint on file.   History of Present Illness: [name] is a 55 y.o. female ***   Activities of Daily Living:  Patient reports morning stiffness for *** {minute/hour:19697}.   Patient {ACTIONS;DENIES/REPORTS:21021675::"Denies"} nocturnal pain.  Difficulty dressing/grooming: {ACTIONS;DENIES/REPORTS:21021675::"Denies"} Difficulty climbing stairs: {ACTIONS;DENIES/REPORTS:21021675::"Denies"} Difficulty getting out of chair: {ACTIONS;DENIES/REPORTS:21021675::"Denies"} Difficulty using hands for taps, buttons, cutlery, and/or writing: {ACTIONS;DENIES/REPORTS:21021675::"Denies"}  No Rheumatology ROS completed.   PMFS History:  Patient Active Problem List   Diagnosis Date Noted   Hyperlipidemia associated with type 2 diabetes mellitus ([place]) 12/24/2020   Stressful work schedule 12/24/2020   Elevated ALT measurement 10/12/2018   GAD (generalized anxiety disorder) 04/08/2018   Carpal tunnel syndrome, left upper limb 09/16/2017   Carpal tunnel syndrome, right upper limb 09/16/2017   History of migraine 08/24/2017   Type 2 diabetes mellitus in remission ([place])    Vitamin D deficiency 01/08/2017   Elevated LDL cholesterol level 08/10/2016   Adenomatous polyps 07/13/2016   Obesity (BMI 30-39.9) 05/11/2016   Leg cramp 06/25/2015   Primary osteoarthritis of both hands 06/25/2015   Hip bursitis 06/25/2015   Chronic TMJ pain 06/25/2015   Chronic pain syndrome 06/25/2015   Muscle pain, fibromyalgia 06/25/2015   Fibromyalgia 06/17/2015    Past Medical History:  Diagnosis Date   Arthritis    Arthritis of knee, right    Bursitis    Carpal tunnel syndrome    Chronic headaches    Constipation    Degenerative  disc disease, lumbar    Depression    Diabetes ([place])    diet controlled, no meds   Eye pressure    Fibromyalgia    GAD (generalized anxiety disorder) 04/08/2018   Hematoma    left leg, per patient    Insomnia    Sciatica    Snoring    Tendonitis    TMJ (dislocation of temporomandibular joint)     Family History  Problem Relation Age of Onset   Hypertension Mother    Glaucoma Mother    Cataracts Mother    Diabetes Father    Hypertension Father    Glaucoma Brother    Ataxia Brother    Fibromyalgia Daughter    Migraines Daughter    Migraines Daughter    Asthma Daughter    Breast cancer Maternal Grandfather    Colon cancer Neg Hx    Esophageal cancer Neg Hx    Pancreatic cancer Neg Hx    Rectal cancer Neg Hx    Stomach cancer Neg Hx    Colon polyps Neg Hx    Past Surgical History:  Procedure Laterality Date   ABDOMINAL HYSTERECTOMY     CESAREAN SECTION     x3   cyst from hand     [place]     right   TEMPOROMANDIBULAR JOINT SURGERY     TONSILLECTOMY     Social History   Social History Narrative   Not on file   Immunization History  Administered Date(s) Administered   PFIZER(Purple Top)SARS-COV-2 Vaccination 06/17/2019, 07/08/2019, 02/21/2020   Tdap 05/11/2016  Objective: Vital Signs: There were no vitals taken for this visit.   Physical Exam   Musculoskeletal Exam: ***  CDAI Exam: CDAI Score: -- Patient Global: --; Provider Global: -- Swollen: --; Tender: -- Joint Exam 04/28/2021   No joint exam has been documented for this visit   There is currently no information documented on the homunculus. Go to the Rheumatology activity and complete the homunculus joint exam.  Investigation: No additional findings.  Imaging: US Guided Needle Placement  Result Date: 04/03/2021 Ultrasound guided injection is preferred based studies that show increased duration, increased effect, greater accuracy, decreased procedural pain,  increased response rate, and decreased cost with ultrasound guided versus blind injection.   Verbal informed consent obtained.  Time-out conducted.  Noted no overlying erythema, induration, or other signs of local infection. Ultrasound-guided left carpal tunnel injection: After sterile prep with Betadine, injected 0.5 mL of 1% lidocaine and 10 mg Kenalog using a 27-gauge needle, needle placement was confirmed in the left median nerve sheath.     Recent Labs: Lab Results  Component Value Date   WBC 6.9 12/24/2020   HGB 13.3 12/24/2020   PLT 241 12/24/2020   NA 142 12/24/2020   K 4.1 12/24/2020   CL 102 12/24/2020   CO2 22 12/24/2020   GLUCOSE 99 12/24/2020   BUN 17 12/24/2020   CREATININE 0.90 12/24/2020   BILITOT 0.4 12/24/2020   ALKPHOS 118 12/24/2020   AST 17 12/24/2020   ALT 14 12/24/2020   PROT 6.9 12/24/2020   ALBUMIN 4.6 12/24/2020   CALCIUM 9.6 12/24/2020   GFRAA 79 12/04/2019    Speciality Comments: No specialty comments available.  Procedures:  No procedures performed Allergies: Amoxicillin, Doxycycline, and Penicillins   Assessment / Plan:     Visit Diagnoses: No diagnosis found.  Orders: No orders of the defined types were placed in this encounter.  No orders of the defined types were placed in this encounter.   Face-to-face time spent with patient was *** minutes. Greater than 50% of time was spent in counseling and coordination of care.  Follow-Up Instructions: No follow-ups on file.   Earnestine Mealing, CMA  Note - This record has been created using Editor, commissioning.  Chart creation errors have been sought, but may not always  have been located. Such creation errors do not reflect on  the standard of medical care.

## 2021-04-15 NOTE — Telephone Encounter (Signed)
Next Visit: 04/28/2021  Last Visit: 04/03/2021  Last Fill: 03/10/2021  DX: Chronic pain syndrome   Current Dose per office note 04/03/2021: Celebrex 200 mg 1 capsule by mouth daily   Labs: 12/24/2020 WNL  Okay to refill Celebrex?

## 2021-04-28 ENCOUNTER — Ambulatory Visit: Payer: BC Managed Care – PPO | Admitting: Physician Assistant

## 2021-06-24 ENCOUNTER — Other Ambulatory Visit: Payer: Self-pay

## 2021-06-24 ENCOUNTER — Telehealth (INDEPENDENT_AMBULATORY_CARE_PROVIDER_SITE_OTHER): Payer: 59 | Admitting: Physician Assistant

## 2021-06-24 ENCOUNTER — Encounter: Payer: 59 | Admitting: Physician Assistant

## 2021-06-24 ENCOUNTER — Encounter: Payer: Self-pay | Admitting: Physician Assistant

## 2021-06-24 VITALS — Ht 63.0 in | Wt 194.0 lb

## 2021-06-24 DIAGNOSIS — E785 Hyperlipidemia, unspecified: Secondary | ICD-10-CM

## 2021-06-24 DIAGNOSIS — J3089 Other allergic rhinitis: Secondary | ICD-10-CM

## 2021-06-24 DIAGNOSIS — E1169 Type 2 diabetes mellitus with other specified complication: Secondary | ICD-10-CM | POA: Diagnosis not present

## 2021-06-24 DIAGNOSIS — E782 Mixed hyperlipidemia: Secondary | ICD-10-CM | POA: Insufficient documentation

## 2021-06-24 MED ORDER — FLUTICASONE PROPIONATE 50 MCG/ACT NA SUSP
2.0000 | Freq: Every day | NASAL | 6 refills | Status: AC
Start: 2021-06-24 — End: ?

## 2021-06-24 MED ORDER — CONTOUR NEXT TEST VI STRP: ORAL_STRIP | 12 refills | Status: AC

## 2021-06-24 MED ORDER — FEXOFENADINE HCL 180 MG PO TABS: 180.0000 mg | ORAL_TABLET | Freq: Every day | ORAL | 2 refills | Status: DC

## 2021-06-24 NOTE — Progress Notes (Signed)
Acute Office Visit  Subjective:    Patient ID: [name], female    DOB: 17-Mar-1966, 56 y.o.   MRN: [id number]  Chief Complaint  Patient presents with   Allergic Rhinitis    Diabetes    HPI Patient is in today for a follow up appointment. Reports head congestion and post nasal drip; denies fever / chills / nausea / vomiting / diarrhea / constipation;    Past Medical History:  Diagnosis Date   Arthritis    Arthritis of knee, right    Bursitis    Carpal tunnel syndrome    Chronic headaches    Constipation    Degenerative disc disease, lumbar    Depression    Diabetes ([name])    diet controlled, no meds   Eye pressure    Fibromyalgia    GAD (generalized anxiety disorder) 04/08/2018   Hematoma    left leg, per patient    Insomnia    Sciatica    Snoring    Tendonitis    TMJ (dislocation of temporomandibular joint)     Past Surgical History:  Procedure Laterality Date   ABDOMINAL HYSTERECTOMY     CESAREAN SECTION     x3   cyst from hand     DENTAL SURGERY     KNEE SURGERY     right   TEMPOROMANDIBULAR JOINT SURGERY     TONSILLECTOMY      Family History  Problem Relation Age of Onset   Hypertension Mother    Glaucoma Mother    Cataracts Mother    Diabetes Father    Hypertension Father    Glaucoma Brother    Ataxia Brother    Fibromyalgia Daughter    Migraines Daughter    Migraines Daughter    Asthma Daughter    Breast cancer Maternal Grandfather    Colon cancer Neg Hx    Esophageal cancer Neg Hx    Pancreatic cancer Neg Hx    Rectal cancer Neg Hx    Stomach cancer Neg Hx    Colon polyps Neg Hx     Social History   Socioeconomic History   Marital status: Married    Spouse name: Not on file   Number of children: Not on file   Years of education: Not on file   Highest education level: Not on file  Occupational History   Not on file  Tobacco Use   Smoking status: Never   Smokeless tobacco: Never  Vaping Use   Vaping Use:  Never used  Substance and Sexual Activity   Alcohol use: No   Drug use: Never   Sexual activity: Yes    Birth control/protection: Surgical  Other Topics Concern   Not on file  Social History Narrative   Not on file   Social Determinants of Health   Financial Resource Strain: Not on file  Food Insecurity: Not on file  Transportation Needs: Not on file  Physical Activity: Not on file  Stress: Not on file  Social Connections: Not on file  Intimate Partner Violence: Not on file    Outpatient Medications Prior to Visit  Medication Sig Dispense Refill   celecoxib (CELEBREX) 200 MG capsule TAKE 1 CAPSULE (200 MG TOTAL) BY MOUTH DAILY AS NEEDED. 30 capsule 2   Cholecalciferol (VITAMIN D PO) Take 2 tablets by mouth.     DULoxetine (CYMBALTA) 60 MG capsule TAKE 1 CAPSULE BY MOUTH EVERY DAY 90 capsule 1   ibuprofen (ADVIL) 200  MG tablet Take 400 mg by mouth as needed.     latanoprost (XALATAN) 0.005 % ophthalmic solution SMARTSIG:In Eye(s)     losartan (COZAAR) 25 MG tablet TAKE 1 TABLET BY MOUTH EVERY DAY 90 tablet 1   ONETOUCH DELICA LANCETS FINE MISC Test once daily. Has one touch verio meter (Patient not taking: Reported on 06/24/2021) 100 each 2   rosuvastatin (CRESTOR) 20 MG tablet Take 1 tablet (20 mg total) by mouth daily. 90 tablet 1   cetirizine (ZYRTEC) 10 MG tablet Take 10 mg by mouth daily.  (Patient not taking: Reported on 06/24/2021)     No facility-administered medications prior to visit.    Allergies  Allergen Reactions   Amoxicillin Swelling   Doxycycline Nausea And Vomiting   Penicillins Swelling    Review of Systems  HENT:  Positive for postnasal drip and sneezing. Negative for ear discharge, sinus pressure and sinus pain.       Objective:    Physical Exam Constitutional:      Appearance: Normal appearance.  HENT:     Head: Normocephalic and atraumatic.  Musculoskeletal:     Cervical back: Normal range of motion.  Neurological:     Mental Status: She is  alert.    Ht $R'5\' 3"'DP$  (1.6 m)    Wt 194 lb (88 kg)    BMI 34.37 kg/m  Wt Readings from Last 3 Encounters:  06/24/21 194 lb (88 kg)  06/24/21 194 lb (88 kg)  04/03/21 201 lb (91.2 kg)    Health Maintenance Due  Topic Date Due   Zoster Vaccines- Shingrix (1 of 2) Never done   COVID-19 Vaccine (4 - Booster for Pfizer series) 04/17/2020   FOOT EXAM  10/18/2020   INFLUENZA VACCINE  Never done    There are no preventive care reminders to display for this patient.   Lab Results  Component Value Date   TSH 0.753 12/24/2020   Lab Results  Component Value Date   WBC 6.9 12/24/2020   HGB 13.3 12/24/2020   HCT 39.9 12/24/2020   MCV 87 12/24/2020   PLT 241 12/24/2020   Lab Results  Component Value Date   NA 140 06/24/2021   K 4.3 06/24/2021   CO2 26 06/24/2021   GLUCOSE 116 (H) 06/24/2021   BUN 14 06/24/2021   CREATININE 0.88 06/24/2021   BILITOT 0.3 06/24/2021   ALKPHOS 143 (H) 06/24/2021   AST 28 06/24/2021   ALT 49 (H) 06/24/2021   PROT 6.9 06/24/2021   ALBUMIN 4.6 06/24/2021   CALCIUM 9.8 06/24/2021   EGFR 77 06/24/2021   Lab Results  Component Value Date   CHOL 200 (H) 12/24/2020   Lab Results  Component Value Date   HDL 74 12/24/2020   Lab Results  Component Value Date   LDLCALC 115 (H) 12/24/2020   Lab Results  Component Value Date   TRIG 61 12/24/2020   Lab Results  Component Value Date   CHOLHDL 2.7 12/24/2020   Lab Results  Component Value Date   HGBA1C 7.2 (H) 06/24/2021       Assessment & Plan:   Problem List Items Addressed This Visit       Respiratory   Non-seasonal allergic rhinitis - Primary   Relevant Medications   fluticasone (FLONASE) 50 MCG/ACT nasal spray   fexofenadine (ALLEGRA ALLERGY) 180 MG tablet     Endocrine   Hyperlipidemia associated with type 2 diabetes mellitus ([place])   Relevant Medications   glucose blood (  CONTOUR NEXT TEST) test strip     Meds ordered this encounter  Medications   fluticasone (FLONASE)  50 MCG/ACT nasal spray    Sig: Place 2 sprays into both nostrils daily.    Dispense:  16 g    Refill:  6    Order Specific Question:   Supervising Provider    Answer:   Denita Lung [[id number]]   fexofenadine (ALLEGRA ALLERGY) 180 MG tablet    Sig: Take 1 tablet (180 mg total) by mouth daily.    Dispense:  90 tablet    Refill:  2    Order Specific Question:   Supervising Provider    Answer:   Denita Lung [[id number]]   glucose blood (CONTOUR NEXT TEST) test strip    Sig: Use as instructed    Dispense:  100 each    Refill:  12    Order Specific Question:   Supervising Provider    Answer:   Denita Lung [[id number]]     Irene Pap, PA-C  Virtual Visit via Video Note   Patient ID: Yaslyn Cumby, female    DOB: 08/06/1965, 56 y.o.   MRN: [id number]  I connected with above patient on 06/26/21 by a video enabled telemedicine application and verified that I am speaking with the correct person using two identifiers.  Location: Patient: car Provider: office   I discussed the limitations of evaluation and management by telemedicine and the availability of in person appointments. The patient expressed understanding and agreed to proceed.  History of Present Illness:  Chief Complaint  Patient presents with   Allergic Rhinitis    Diabetes       Observations/Objective:  Ht $R'5\' 3"'JL$  (1.6 m)    Wt 194 lb (88 kg)    BMI 34.37 kg/m    Assessment: Encounter Diagnoses  Name Primary?   Non-seasonal allergic rhinitis, unspecified trigger Yes   Hyperlipidemia associated with type 2 diabetes mellitus ([place])      Plan:   Shadaya was seen today for allergic rhinitis  and diabetes.  Diagnoses and all orders for this visit:  Non-seasonal allergic rhinitis, unspecified trigger -     fluticasone (FLONASE) 50 MCG/ACT nasal spray; Place 2 sprays into both nostrils daily. -     fexofenadine (ALLEGRA ALLERGY) 180 MG tablet; Take 1 tablet (180 mg total) by mouth  daily.  Hyperlipidemia associated with type 2 diabetes mellitus ([facility]) -     glucose blood (CONTOUR NEXT TEST) test strip; Use as instructed    Follow up: return in 6 months   I discussed the assessment and treatment plan with the patient. The patient was provided an opportunity to ask questions and all were answered. The patient agreed with the plan and demonstrated an understanding of the instructions.   The patient was advised to call back or seek an in-person evaluation if the symptoms worsen or if the condition fails to improve as anticipated.  I spent 15 minutes dedicated to the care of this patient, including pre-visit review of records, face to face time, post-visit ordering of testing and documentation.    Irene Pap, PA-C

## 2021-06-25 ENCOUNTER — Encounter: Payer: Self-pay | Admitting: Physician Assistant

## 2021-06-25 LAB — COMPREHENSIVE METABOLIC PANEL
ALT: 49 IU/L — ABNORMAL HIGH (ref 0–32)
AST: 28 IU/L (ref 0–40)
Albumin/Globulin Ratio: 2 (ref 1.2–2.2)
Albumin: 4.6 g/dL (ref 3.8–4.9)
Alkaline Phosphatase: 143 IU/L — ABNORMAL HIGH (ref 44–121)
BUN/Creatinine Ratio: 16 (ref 9–23)
BUN: 14 mg/dL (ref 6–24)
Bilirubin Total: 0.3 mg/dL (ref 0.0–1.2)
CO2: 26 mmol/L (ref 20–29)
Calcium: 9.8 mg/dL (ref 8.7–10.2)
Chloride: 103 mmol/L (ref 96–106)
Creatinine, Ser: 0.88 mg/dL (ref 0.57–1.00)
Globulin, Total: 2.3 g/dL (ref 1.5–4.5)
Glucose: 116 mg/dL — ABNORMAL HIGH (ref 70–99)
Potassium: 4.3 mmol/L (ref 3.5–5.2)
Sodium: 140 mmol/L (ref 134–144)
Total Protein: 6.9 g/dL (ref 6.0–8.5)
eGFR: 77 mL/min/{1.73_m2} (ref >59)

## 2021-06-25 LAB — HEMOGLOBIN A1C
Est. average glucose Bld gHb Est-mCnc: 160 mg/dL
Hgb A1c MFr Bld: 7.2 % — ABNORMAL HIGH (ref 4.8–5.6)

## 2021-06-26 ENCOUNTER — Other Ambulatory Visit: Payer: Self-pay | Admitting: Physician Assistant

## 2021-06-26 ENCOUNTER — Encounter: Payer: Self-pay | Admitting: Physician Assistant

## 2021-06-26 DIAGNOSIS — E785 Hyperlipidemia, unspecified: Secondary | ICD-10-CM

## 2021-06-26 DIAGNOSIS — J3089 Other allergic rhinitis: Secondary | ICD-10-CM | POA: Insufficient documentation

## 2021-06-26 DIAGNOSIS — E1169 Type 2 diabetes mellitus with other specified complication: Secondary | ICD-10-CM

## 2021-06-26 MED ORDER — OZEMPIC (0.25 OR 0.5 MG/DOSE) 2 MG/1.5ML ~~LOC~~ SOPN: 0.5000 mg | PEN_INJECTOR | SUBCUTANEOUS | 3 refills | Status: DC

## 2021-06-26 NOTE — Progress Notes (Signed)
In office visit changed to a virtual visit.  ?

## 2021-06-27 ENCOUNTER — Telehealth: Payer: Self-pay

## 2021-06-27 NOTE — Telephone Encounter (Signed)
P.A. [place] completed

## 2021-07-05 ENCOUNTER — Other Ambulatory Visit: Payer: Self-pay | Admitting: Family Medicine

## 2021-07-05 DIAGNOSIS — E119 Type 2 diabetes mellitus without complications: Secondary | ICD-10-CM

## 2021-07-12 NOTE — Telephone Encounter (Signed)
P.A. denied, pt needs trial of 3 month Metformin.  Do you want to switch?

## 2021-07-12 NOTE — Telephone Encounter (Signed)
See note

## 2021-07-14 ENCOUNTER — Other Ambulatory Visit: Payer: Self-pay | Admitting: Physician Assistant

## 2021-07-14 ENCOUNTER — Encounter: Payer: Self-pay | Admitting: Physician Assistant

## 2021-07-14 NOTE — Telephone Encounter (Signed)
I will ask the patient. Thank you.

## 2021-07-15 ENCOUNTER — Other Ambulatory Visit: Payer: Self-pay | Admitting: Physician Assistant

## 2021-07-15 DIAGNOSIS — E1165 Type 2 diabetes mellitus with hyperglycemia: Secondary | ICD-10-CM | POA: Insufficient documentation

## 2021-07-15 DIAGNOSIS — E782 Mixed hyperlipidemia: Secondary | ICD-10-CM

## 2021-07-15 MED ORDER — ROSUVASTATIN CALCIUM 20 MG PO TABS: 20.0000 mg | ORAL_TABLET | Freq: Every day | ORAL | 1 refills | Status: DC

## 2021-07-15 MED ORDER — LOSARTAN POTASSIUM 25 MG PO TABS: 25.0000 mg | ORAL_TABLET | Freq: Every day | ORAL | 1 refills | Status: DC

## 2021-07-15 NOTE — Progress Notes (Signed)
BP Readings from Last 15 Encounters:  ?04/03/21 130/84  ?12/24/20 124/80  ?10/28/20 132/83  ?09/02/20 (!) 98/53  ?06/20/20 124/80  ?04/29/20 121/83  ?04/10/20 110/64  ?02/21/20 138/72  ?10/19/19 110/70  ?08/10/19 120/78  ?07/31/19 114/69  ?04/11/19 120/72  ?01/31/19 120/81  ?01/28/19 138/72  ?10/10/18 122/82  ? ? ?

## 2021-07-17 NOTE — Telephone Encounter (Signed)
Not covered by pt's insurance

## 2021-07-17 NOTE — Telephone Encounter (Signed)
Pt's being referred to endocrinologist

## 2021-08-15 ENCOUNTER — Other Ambulatory Visit: Payer: Self-pay | Admitting: Physician Assistant

## 2021-09-14 ENCOUNTER — Encounter: Payer: Self-pay | Admitting: Physician Assistant

## 2021-09-16 ENCOUNTER — Ambulatory Visit: Payer: 59 | Admitting: Physician Assistant

## 2021-09-16 ENCOUNTER — Encounter: Payer: Self-pay | Admitting: Physician Assistant

## 2021-09-16 VITALS — BP 120/83 | HR 84 | Temp 97.0°F | Resp 18 | Ht 67.0 in | Wt 183.0 lb

## 2021-09-16 DIAGNOSIS — N3000 Acute cystitis without hematuria: Secondary | ICD-10-CM

## 2021-09-16 LAB — POCT URINALYSIS DIPSTICK
Appearance: NORMAL
Bilirubin, UA: NEGATIVE
Blood, UA: NEGATIVE
Glucose, UA: NEGATIVE
Ketones, UA: NEGATIVE
Nitrite, UA: NEGATIVE
Protein, UA: NEGATIVE
Spec Grav, UA: >=1.03 — AB (ref 1.010–1.025)
Urobilinogen, UA: 0.2 E.U./dL
pH, UA: 5.5 (ref 5.0–8.0)

## 2021-09-16 MED ORDER — SULFAMETHOXAZOLE-TRIMETHOPRIM 800-160 MG PO TABS: 1.0000 | ORAL_TABLET | Freq: Two times a day (BID) | ORAL | 0 refills | Status: AC

## 2021-09-16 NOTE — Patient Instructions (Signed)
You are going to take Bactrim twice a day for the next 5 days.  We will call you with the results of your urine culture.  I encourage you to get plenty of rest, you can use Tylenol or ibuprofen over-the-counter, and stay very well-hydrated.  I hope that you feel better soon.  Please let us know if there is any else we can do for you  Kennieth Rad, PA-C Physician Assistant [facility] [email or web address]   Urinary Tract Infection, Adult  A urinary tract infection (UTI) is an infection of any part of the urinary tract. The urinary tract includes the kidneys, ureters, bladder, and urethra. These organs make, store, and get rid of urine in the body. An upper UTI affects the ureters and kidneys. A lower UTI affects the bladder and urethra. What are the causes? Most urinary tract infections are caused by bacteria in your genital area around your urethra, where urine leaves your body. These bacteria grow and cause inflammation of your urinary tract. What increases the risk? You are more likely to develop this condition if: You have a urinary catheter that stays in place. You are not able to control when you urinate or have a bowel movement (incontinence). You are female and you: Use a spermicide or diaphragm for birth control. Have low estrogen levels. Are pregnant. You have certain genes that increase your risk. You are sexually active. You take antibiotic medicines. You have a condition that causes your flow of urine to slow down, such as: An enlarged prostate, if you are female. Blockage in your urethra. A kidney stone. A nerve condition that affects your bladder control (neurogenic bladder). Not getting enough to drink, or not urinating often. You have certain medical conditions, such as: Diabetes. A weak disease-fighting system (immunesystem). Sickle cell disease. Gout. Spinal cord injury. What are the signs or  symptoms? Symptoms of this condition include: Needing to urinate right away (urgency). Frequent urination. This may include small amounts of urine each time you urinate. Pain or burning with urination. Blood in the urine. Urine that smells bad or unusual. Trouble urinating. Cloudy urine. Vaginal discharge, if you are female. Pain in the abdomen or the lower back. You may also have: Vomiting or a decreased appetite. Confusion. Irritability or tiredness. A fever or chills. Diarrhea. The first symptom in older adults may be confusion. In some cases, they may not have any symptoms until the infection has worsened. How is this diagnosed? This condition is diagnosed based on your medical history and a physical exam. You may also have other tests, including: Urine tests. Blood tests. Tests for STIs (sexually transmitted infections). If you have had more than one UTI, a cystoscopy or imaging studies may be done to determine the cause of the infections. How is this treated? Treatment for this condition includes: Antibiotic medicine. Over-the-counter medicines to treat discomfort. Drinking enough water to stay hydrated. If you have frequent infections or have other conditions such as a kidney stone, you may need to see a health care provider who specializes in the urinary tract (urologist). In rare cases, urinary tract infections can cause sepsis. Sepsis is a life-threatening condition that occurs when the body responds to an infection. Sepsis is treated in the hospital with IV antibiotics, fluids, and other medicines. Follow these instructions at home:  Medicines Take over-the-counter and prescription medicines only as told by your health care provider. If you were prescribed an antibiotic medicine, take it as told by your health care  provider. Do not stop using the antibiotic even if you start to feel better. General instructions Make sure you: Empty your bladder often and completely.  Do not hold urine for long periods of time. Empty your bladder after sex. Wipe from front to back after urinating or having a bowel movement if you are female. Use each tissue only one time when you wipe. Drink enough fluid to keep your urine pale yellow. Keep all follow-up visits. This is important. Contact a health care provider if: Your symptoms do not get better after 1-2 days. Your symptoms go away and then return. Get help right away if: You have severe pain in your back or your lower abdomen. You have a fever or chills. You have nausea or vomiting. Summary A urinary tract infection (UTI) is an infection of any part of the urinary tract, which includes the kidneys, ureters, bladder, and urethra. Most urinary tract infections are caused by bacteria in your genital area. Treatment for this condition often includes antibiotic medicines. If you were prescribed an antibiotic medicine, take it as told by your health care provider. Do not stop using the antibiotic even if you start to feel better. Keep all follow-up visits. This is important. This information is not intended to replace advice given to you by your health care provider. Make sure you discuss any questions you have with your health care provider. Document Revised: 11/17/2019 Document Reviewed: 11/17/2019 Elsevier Patient Education  [place].

## 2021-09-16 NOTE — Progress Notes (Signed)
Patient has  Patient has Patient reports pressure beginning last week with a slight odor, no disconnect.

## 2021-09-16 NOTE — Progress Notes (Signed)
Acute Office Visit  Subjective:     Patient ID: [name], female    DOB: [birth year], 56 y.o.   MRN: [id number]  Chief Complaint  Patient presents with   Urinary Tract Infection    HPI Patient is in today for possible UTI.  States that she started having suprapubic pressure and pressure when she urinates along with an odor for the past week.  States that she already urinates frequently due to her diagnosis of diabetes.  Denies dysuria or vaginal discharge.  States that she has been drinking cranberry juice and water along with taking probiotics without much relief.  Does endorse history of urinary tract infections and states this feels similar.    Review of Systems  Constitutional:  Negative for chills and fever.  HENT: Negative.    Eyes: Negative.   Respiratory:  Negative for shortness of breath.   Cardiovascular:  Negative for chest pain.  Gastrointestinal:  Positive for abdominal pain. Negative for constipation, diarrhea, nausea and vomiting.  Genitourinary:  Positive for frequency and urgency. Negative for dysuria and hematuria.  Musculoskeletal:  Negative for back pain.  Skin: Negative.   Neurological: Negative.   Endo/Heme/Allergies: Negative.   Psychiatric/Behavioral: Negative.         Objective:    BP 120/83 (BP Location: Left Arm, Patient Position: Sitting, Cuff Size: Normal)   Pulse 84   Temp (!) 97 F (36.1 C) (Oral)   Resp 18   Ht '5\' 7"'$  (1.702 m)   Wt 183 lb (83 kg)   SpO2 100%   BMI 28.66 kg/m    Physical Exam Vitals and nursing note reviewed.  Constitutional:      Appearance: Normal appearance.  HENT:     Head: Normocephalic and atraumatic.     Right Ear: External ear normal.     Left Ear: External ear normal.     Nose: Nose normal.     Mouth/Throat:     Mouth: Mucous membranes are moist.     Pharynx: Oropharynx is clear.  Eyes:     Extraocular Movements: Extraocular movements intact.     Conjunctiva/sclera: Conjunctivae  normal.     Pupils: Pupils are equal, round, and reactive to light.  Cardiovascular:     Rate and Rhythm: Normal rate and regular rhythm.     Pulses: Normal pulses.     Heart sounds: Normal heart sounds.  Pulmonary:     Effort: Pulmonary effort is normal.     Breath sounds: Normal breath sounds.  Abdominal:     General: Abdomen is flat. Bowel sounds are normal.     Tenderness: There is abdominal tenderness in the suprapubic area. There is no right CVA tenderness or left CVA tenderness.  Musculoskeletal:        General: Normal range of motion.     Cervical back: Normal range of motion and neck supple.  Skin:    General: Skin is warm and dry.  Neurological:     General: No focal deficit present.     Mental Status: She is alert and oriented to person, place, and time. Mental status is at baseline.  Psychiatric:        Mood and Affect: Mood normal.        Behavior: Behavior normal.        Thought Content: Thought content normal.        Judgment: Judgment normal.    Results for orders placed or performed in visit on 09/16/21  Urinalysis Dipstick  Result Value Ref Range   Color, UA yellow    Clarity, UA clear    Glucose, UA Negative Negative   Bilirubin, UA neg    Ketones, UA neg    Spec Grav, UA >=1.030 (A) 1.010 - 1.025   Blood, UA neg    pH, UA 5.5 5.0 - 8.0   Protein, UA Negative Negative   Urobilinogen, UA 0.2 0.2 or 1.0 E.U./dL   Nitrite, UA neg    Leukocytes, UA Trace (A) Negative   Appearance normal    Odor slight         Assessment & Plan:   Problem List Items Addressed This Visit   None Visit Diagnoses     Acute cystitis without hematuria    -  Primary   Relevant Medications   sulfamethoxazole-trimethoprim (BACTRIM DS) 800-160 MG tablet   Other Relevant Orders   Urine Culture   Urinalysis Dipstick (Completed)       Meds ordered this encounter  Medications   sulfamethoxazole-trimethoprim (BACTRIM DS) 800-160 MG tablet    Sig: Take 1 tablet by  mouth 2 (two) times daily for 5 days.    Dispense:  10 tablet    Refill:  0    Order Specific Question:   Supervising Provider    Answer:   Joya Gaskins, PATRICK E [[id number]]   1. Acute cystitis without hematuria UA showed leukocytes, no nitrates, but given clinical presentation, reasonable to start Bactrim.  Patient education given on supportive care, red flags for prompt reevaluation. - sulfamethoxazole-trimethoprim (BACTRIM DS) 800-160 MG tablet; Take 1 tablet by mouth 2 (two) times daily for 5 days.  Dispense: 10 tablet; Refill: 0 - Urine Culture - Urinalysis Dipstick    I have reviewed the patient's medical history (PMH, PSH, Social History, Family History, Medications, and allergies) , and have been updated if relevant. I spent 20 minutes reviewing chart and  face to face time with patient.   Return if symptoms worsen or fail to improve.  Loraine Grip Mayers, PA-C

## 2021-09-18 LAB — URINE CULTURE

## 2021-09-20 ENCOUNTER — Telehealth: Payer: Self-pay | Admitting: *Deleted

## 2021-09-20 NOTE — Telephone Encounter (Signed)
-----   Message from Kennieth Rad, [place] sent at 09/18/2021 12:16 PM EDT ----- Please call patient and let her know that her urine culture did not show any bacteria.  The treatment with Bactrim should be sufficient

## 2021-09-20 NOTE — Telephone Encounter (Signed)
Patient verified DOB Patient is aware of results.

## 2021-10-17 ENCOUNTER — Encounter: Payer: Self-pay | Admitting: Internal Medicine

## 2021-11-06 ENCOUNTER — Encounter: Payer: Self-pay | Admitting: Physician Assistant

## 2021-11-17 ENCOUNTER — Other Ambulatory Visit: Payer: Self-pay | Admitting: Medical

## 2021-12-24 ENCOUNTER — Encounter: Payer: Self-pay | Admitting: Internal Medicine

## 2022-01-14 ENCOUNTER — Other Ambulatory Visit: Payer: Self-pay | Admitting: Physician Assistant

## 2022-01-15 NOTE — Telephone Encounter (Signed)
Left message for pt to call back to schedule a visit as she is due. We can refill her medications when she schedules

## 2022-01-19 ENCOUNTER — Telehealth: Payer: Self-pay

## 2022-01-19 NOTE — Telephone Encounter (Signed)
Pt. Called stating she was calling back to schedule a med check apt. Per message she received about getting her medications refilled. I got her scheduled on 01/29/22 for a med check if you could refill her meds until then. I asked her which ones she needed and she said which ever ones the pharmacy sent to [place]. Losartan and Rosuvastatin are due according to her chart.

## 2022-01-19 NOTE — Telephone Encounter (Signed)
Meds filled for 30 days to cover her until her apt. [place]

## 2022-01-27 ENCOUNTER — Encounter: Payer: Self-pay | Admitting: Internal Medicine

## 2022-01-29 ENCOUNTER — Ambulatory Visit: Payer: 59 | Admitting: Family Medicine

## 2022-01-29 ENCOUNTER — Encounter: Payer: Self-pay | Admitting: Family Medicine

## 2022-01-29 VITALS — BP 114/76 | HR 90 | Temp 97.5°F | Wt 190.6 lb

## 2022-01-29 DIAGNOSIS — E1169 Type 2 diabetes mellitus with other specified complication: Secondary | ICD-10-CM | POA: Diagnosis not present

## 2022-01-29 DIAGNOSIS — E1165 Type 2 diabetes mellitus with hyperglycemia: Secondary | ICD-10-CM | POA: Diagnosis not present

## 2022-01-29 DIAGNOSIS — M797 Fibromyalgia: Secondary | ICD-10-CM

## 2022-01-29 DIAGNOSIS — Z2821 Immunization not carried out because of patient refusal: Secondary | ICD-10-CM | POA: Diagnosis not present

## 2022-01-29 DIAGNOSIS — E785 Hyperlipidemia, unspecified: Secondary | ICD-10-CM

## 2022-01-29 MED ORDER — LOSARTAN POTASSIUM 25 MG PO TABS: 25.0000 mg | ORAL_TABLET | Freq: Every day | ORAL | 3 refills | Status: DC

## 2022-01-29 MED ORDER — ROSUVASTATIN CALCIUM 20 MG PO TABS: 20.0000 mg | ORAL_TABLET | Freq: Every day | ORAL | 1 refills | Status: DC

## 2022-01-29 NOTE — Progress Notes (Signed)
   Subjective:    Patient ID: [name], female    DOB: May 20, 1965, 56 y.o.   MRN: [id number]  HPI She is here for medication management visit.  She is cared for at [facility] for her underlying fibromyalgia as well as diabetes.  Her last hemoglobin A1c was 6.1.  She continues on Cymbalta for her fibromyalgia as well as Celebrex.  She is on low-dose losartan for kidney protection.  Continues on Crestor and is having no difficulty with that.  Recent blood work was in March and was reviewed.   Review of Systems     Objective:   Physical Exam Alert and in no distress otherwise not examined  Immunizations discussed with patient and she refused     Assessment & Plan:  Hyperlipidemia associated with type 2 diabetes mellitus ([place]) - Plan: rosuvastatin (CRESTOR) 20 MG tablet  Immunization refused  Fibromyalgia  Type 2 diabetes mellitus with hyperglycemia, without long-term current use of insulin ([place]) - Plan: losartan (COZAAR) 25 MG tablet She will continue on her present medication regimen and follow-up with [facility].

## 2022-02-16 ENCOUNTER — Other Ambulatory Visit: Payer: Self-pay | Admitting: Family Medicine

## 2022-02-16 NOTE — Telephone Encounter (Signed)
Refill request last apt 01/29/22.

## 2022-03-03 ENCOUNTER — Encounter: Payer: Self-pay | Admitting: Internal Medicine

## 2022-05-20 ENCOUNTER — Other Ambulatory Visit: Payer: Self-pay | Admitting: Family Medicine

## 2022-05-20 NOTE — Telephone Encounter (Signed)
Refill request last apt 02/09/22 with you for a med check.

## 2022-06-10 DIAGNOSIS — I10 Essential (primary) hypertension: Secondary | ICD-10-CM | POA: Diagnosis not present

## 2022-06-10 DIAGNOSIS — E1165 Type 2 diabetes mellitus with hyperglycemia: Secondary | ICD-10-CM | POA: Diagnosis not present

## 2022-06-10 DIAGNOSIS — E7800 Pure hypercholesterolemia, unspecified: Secondary | ICD-10-CM | POA: Diagnosis not present

## 2022-07-07 ENCOUNTER — Ambulatory Visit: Payer: Self-pay

## 2022-07-07 ENCOUNTER — Other Ambulatory Visit: Payer: Self-pay | Admitting: Family Medicine

## 2022-07-07 DIAGNOSIS — M25561 Pain in right knee: Secondary | ICD-10-CM

## 2022-07-30 ENCOUNTER — Other Ambulatory Visit: Payer: Self-pay | Admitting: Family Medicine

## 2022-07-30 DIAGNOSIS — E1169 Type 2 diabetes mellitus with other specified complication: Secondary | ICD-10-CM

## 2022-08-20 ENCOUNTER — Other Ambulatory Visit: Payer: Self-pay | Admitting: Family Medicine

## 2022-09-14 ENCOUNTER — Other Ambulatory Visit: Payer: Self-pay | Admitting: Nurse Practitioner

## 2022-09-15 NOTE — Telephone Encounter (Signed)
Refill request last apt 01/29/22. 

## 2022-10-24 ENCOUNTER — Other Ambulatory Visit: Payer: Self-pay | Admitting: Nurse Practitioner

## 2022-10-24 DIAGNOSIS — E1169 Type 2 diabetes mellitus with other specified complication: Secondary | ICD-10-CM

## 2022-12-24 DIAGNOSIS — E049 Nontoxic goiter, unspecified: Secondary | ICD-10-CM | POA: Diagnosis not present

## 2022-12-24 DIAGNOSIS — I10 Essential (primary) hypertension: Secondary | ICD-10-CM | POA: Diagnosis not present

## 2022-12-24 DIAGNOSIS — M797 Fibromyalgia: Secondary | ICD-10-CM | POA: Diagnosis not present

## 2022-12-24 DIAGNOSIS — E1165 Type 2 diabetes mellitus with hyperglycemia: Secondary | ICD-10-CM | POA: Diagnosis not present

## 2022-12-24 DIAGNOSIS — E7800 Pure hypercholesterolemia, unspecified: Secondary | ICD-10-CM | POA: Diagnosis not present

## 2023-01-18 ENCOUNTER — Other Ambulatory Visit: Payer: Self-pay | Admitting: Nurse Practitioner

## 2023-01-18 DIAGNOSIS — Z1231 Encounter for screening mammogram for malignant neoplasm of breast: Secondary | ICD-10-CM

## 2023-01-30 ENCOUNTER — Other Ambulatory Visit: Payer: Self-pay | Admitting: Family Medicine

## 2023-01-30 ENCOUNTER — Other Ambulatory Visit: Payer: Self-pay | Admitting: Nurse Practitioner

## 2023-01-30 DIAGNOSIS — E1165 Type 2 diabetes mellitus with hyperglycemia: Secondary | ICD-10-CM

## 2023-01-30 DIAGNOSIS — E1169 Type 2 diabetes mellitus with other specified complication: Secondary | ICD-10-CM

## 2023-02-13 ENCOUNTER — Ambulatory Visit
Admission: RE | Admit: 2023-02-13 | Discharge: 2023-02-13 | Disposition: A | Discharge status: Home / Self Care | Payer: Self-pay | Source: Ambulatory Visit | Attending: Nurse Practitioner | Admitting: Nurse Practitioner

## 2023-02-13 DIAGNOSIS — Z1231 Encounter for screening mammogram for malignant neoplasm of breast: Secondary | ICD-10-CM

## 2023-02-16 ENCOUNTER — Encounter: Payer: Self-pay | Admitting: Nurse Practitioner

## 2023-02-16 ENCOUNTER — Ambulatory Visit: Payer: BC Managed Care – PPO | Admitting: Nurse Practitioner

## 2023-02-16 VITALS — BP 120/82 | HR 88 | Wt 179.8 lb

## 2023-02-16 DIAGNOSIS — E782 Mixed hyperlipidemia: Secondary | ICD-10-CM | POA: Diagnosis not present

## 2023-02-16 DIAGNOSIS — E1165 Type 2 diabetes mellitus with hyperglycemia: Secondary | ICD-10-CM

## 2023-02-16 DIAGNOSIS — F430 Acute stress reaction: Secondary | ICD-10-CM | POA: Insufficient documentation

## 2023-02-16 DIAGNOSIS — E1169 Type 2 diabetes mellitus with other specified complication: Secondary | ICD-10-CM

## 2023-02-16 DIAGNOSIS — E785 Hyperlipidemia, unspecified: Secondary | ICD-10-CM

## 2023-02-16 DIAGNOSIS — M797 Fibromyalgia: Secondary | ICD-10-CM

## 2023-02-16 MED ORDER — DULOXETINE HCL 60 MG PO CPEP
60.0000 mg | ORAL_CAPSULE | Freq: Every day | ORAL | 3 refills | Status: DC
Start: 2023-02-16 — End: 2023-05-28

## 2023-02-16 MED ORDER — ROSUVASTATIN CALCIUM 20 MG PO TABS: 20.0000 mg | ORAL_TABLET | Freq: Every day | ORAL | 3 refills | Status: DC

## 2023-02-16 MED ORDER — LOSARTAN POTASSIUM 25 MG PO TABS: 25.0000 mg | ORAL_TABLET | Freq: Every day | ORAL | 3 refills | Status: DC

## 2023-02-16 MED ORDER — GABAPENTIN 300 MG PO CAPS: 300.0000 mg | ORAL_CAPSULE | Freq: Three times a day (TID) | ORAL | 6 refills | Status: DC

## 2023-02-16 NOTE — Progress Notes (Signed)
Brianna Clamp, DNP, AGNP-c [facility]  [address] [place], [place] [address] [phone]  ESTABLISHED PATIENT- Chronic Health and/or Follow-Up Visit  Blood pressure 120/82, pulse 88, weight 179 lb 12.8 oz (81.6 kg).    [name] is a 57 y.o. year old female presenting today for evaluation and management of chronic conditions.   Husband: stroke in March Mom: fall, shoulder dislocation Brother: atoxia, wheel chair bound, now in long term care, she is his POA.  Very stressed. Panic attacks when out of the house.  Work stress and anxiety. Overwhelmed. FMLA? For spouse? To get accommodations to work from home? 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 The patient's primary stressors include caring for a husband who recently suffered a stroke, a mother with [name] signs of Alzheimer's, and a brother with a disability. The patient's husband is wheelchair-bound, has short-term memory issues, and is partially paralyzed on the left side. The patient's mother has been showing signs of confusion and memory loss. The patient's brother is also wheelchair-bound and recently moved into a long-term care facility.  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 The patient also recently sold her home due to financial constraints and moved in with her mother to consolidate caregiving responsibilities.  The patient reports feeling overwhelmed and often experiences anxiety, particularly in crowded places. The patient manages these feelings by listening to music and taking prescribed medication for anxiety and pain  (Duloxetine). However, the patient reports that the medication is no longer as effective as it once was.  In addition to the chronic stress and anxiety, the patient also suffers from carpal tunnel syndrome and fibromyalgia, which are exacerbated by the colder weather. The patient reports difficulty sleeping and often forgets to eat due to the demands of caregiving and work.  The patient has a history of diabetes, which is currently managed with Ozempic. The patient's most recent A1C was 6.4, indicating good glycemic control. The patient also has a history of pain management with morphine, but reports that it no longer induces sleepiness.  The patient expresses a desire for additional support and resources to manage her caregiving responsibilities and work demands. The patient is open to counseling and is exploring options for community support services. The patient also expresses a desire for better pain management and is open to trying new medications.  All ROS negative with exception of what is listed above.   PHYSICAL EXAM Physical Exam Vitals and nursing note reviewed.  Constitutional:      Appearance: Normal appearance.  HENT:     Head: Normocephalic.  Eyes:     Pupils: Pupils are equal, round, and reactive to light.  Cardiovascular:     Rate and Rhythm: Normal rate and regular rhythm.     Pulses: Normal pulses.     Heart sounds: Normal heart sounds.  Pulmonary:     Effort: Pulmonary effort is normal.     Breath sounds: Normal breath sounds.  Musculoskeletal:        General: Normal range of motion.     Cervical back: Normal range of motion.     Right lower leg: No edema.     Left lower leg:  No edema.  Skin:    General: Skin is warm.  Neurological:     General: No focal deficit present.     Mental Status: She is alert and oriented to person, place, and time.  Psychiatric:        Attention and Perception: Attention normal.        Mood and Affect: Mood is depressed. Affect is  tearful.        Speech: Speech normal.        Behavior: Behavior normal. Behavior is cooperative.        Thought Content: Thought content normal.        Cognition and Memory: Cognition and memory normal.        Judgment: Judgment normal.      PLAN Problem List Items Addressed This Visit     Fibromyalgia   Relevant Medications   DULoxetine (CYMBALTA) 60 MG capsule   gabapentin (NEURONTIN) 300 MG capsule   Other Relevant Orders   CBC with Differential/Platelet   CMP14+EGFR   Lipid panel   Stress response - Primary   Relevant Medications   DULoxetine (CYMBALTA) 60 MG capsule   Other Relevant Orders   Ambulatory referral to Psychology   Type 2 diabetes mellitus with hyperglycemia, without long-term current use of insulin (HCC)   Relevant Medications   OZEMPIC, 0.25 OR 0.5 MG/DOSE, 2 MG/3ML SOPN   losartan (COZAAR) 25 MG tablet (Start on 04/22/2023)   rosuvastatin (CRESTOR) 20 MG tablet (Start on 04/22/2023)   Other Relevant Orders   CMP14+EGFR   Microalbumin / creatinine urine ratio   Hyperlipidemia associated with type 2 diabetes mellitus (HCC)   Relevant Medications   OZEMPIC, 0.25 OR 0.5 MG/DOSE, 2 MG/3ML SOPN   losartan (COZAAR) 25 MG tablet (Start on 04/22/2023)   rosuvastatin (CRESTOR) 20 MG tablet (Start on 04/22/2023)   Other Relevant Orders   CBC with Differential/Platelet   CMP14+EGFR   Lipid panel   Other Visit Diagnoses     Mixed hyperlipidemia       Relevant Medications   losartan (COZAAR) 25 MG tablet (Start on 04/22/2023)   rosuvastatin (CRESTOR) 20 MG tablet (Start on 04/22/2023)   Other Relevant Orders   CBC with Differential/Platelet   CMP14+EGFR   Lipid panel     Chronic Pain (Fibromyalgia)   Reports increased pain, particularly with the onset of colder weather. Current medication (Cymbalta) is not providing adequate relief.   -Start Gabapentin 300-600mg  three times a day, titrating as needed for pain control.    Anxiety   Reports increased stress and  anxiety due to multiple family caregiving responsibilities and work demands. Currently on Duloxetine.   -Continue Duloxetine.   -Referral to counseling for additional support.    Diabetes   Reports last A1C was 6.4, currently on Ozempic.   -Continue Ozempic.   -Continue follow-up with endocrinology.    General Health Maintenance   -Check cholesterol levels.   -Refill all current medications as she is nearing expiration.   -Explore potential for work accommodations under the Disabilities Act.   -Referral to community Naval architect for additional support.  No follow-ups on file.  Brianna Clamp, DNP, AGNP-c

## 2023-02-17 LAB — LIPID PANEL
Chol/HDL Ratio: 2.2 ratio (ref 0.0–4.4)
Cholesterol, Total: 158 mg/dL (ref 100–199)
HDL: 72 mg/dL (ref >39)
LDL Chol Calc (NIH): 74 mg/dL (ref 0–99)
Triglycerides: 62 mg/dL (ref 0–149)
VLDL Cholesterol Cal: 12 mg/dL (ref 5–40)

## 2023-02-17 LAB — CBC WITH DIFFERENTIAL/PLATELET
Basophils Absolute: 0 10*3/uL (ref 0.0–0.2)
Basos: 0 %
EOS (ABSOLUTE): 0.1 10*3/uL (ref 0.0–0.4)
Eos: 1 %
Hematocrit: 40.9 % (ref 34.0–46.6)
Hemoglobin: 13.1 g/dL (ref 11.1–15.9)
Immature Grans (Abs): 0 10*3/uL (ref 0.0–0.1)
Immature Granulocytes: 0 %
Lymphocytes Absolute: 1.7 10*3/uL (ref 0.7–3.1)
Lymphs: 29 %
MCH: 28.1 pg (ref 26.6–33.0)
MCHC: 32 g/dL (ref 31.5–35.7)
MCV: 88 fL (ref 79–97)
Monocytes Absolute: 0.4 10*3/uL (ref 0.1–0.9)
Monocytes: 6 %
Neutrophils Absolute: 3.7 10*3/uL (ref 1.4–7.0)
Neutrophils: 64 %
Platelets: 235 10*3/uL (ref 150–450)
RBC: 4.66 x10E6/uL (ref 3.77–5.28)
RDW: 13.9 % (ref 11.7–15.4)
WBC: 5.9 10*3/uL (ref 3.4–10.8)

## 2023-02-17 LAB — MICROALBUMIN / CREATININE URINE RATIO
Creatinine, Urine: 123 mg/dL
Microalb/Creat Ratio: 4 mg/g{creat} (ref 0–29)
Microalbumin, Urine: 4.4 ug/mL

## 2023-02-17 LAB — CMP14+EGFR
ALT: 37 IU/L — ABNORMAL HIGH (ref 0–32)
AST: 25 IU/L (ref 0–40)
Albumin: 4.5 g/dL (ref 3.8–4.9)
Alkaline Phosphatase: 127 IU/L — ABNORMAL HIGH (ref 44–121)
BUN/Creatinine Ratio: 16 (ref 9–23)
BUN: 13 mg/dL (ref 6–24)
Bilirubin Total: 0.5 mg/dL (ref 0.0–1.2)
CO2: 22 mmol/L (ref 20–29)
Calcium: 9.9 mg/dL (ref 8.7–10.2)
Chloride: 103 mmol/L (ref 96–106)
Creatinine, Ser: 0.82 mg/dL (ref 0.57–1.00)
Globulin, Total: 2.5 g/dL (ref 1.5–4.5)
Glucose: 105 mg/dL — ABNORMAL HIGH (ref 70–99)
Potassium: 3.8 mmol/L (ref 3.5–5.2)
Sodium: 141 mmol/L (ref 134–144)
Total Protein: 7 g/dL (ref 6.0–8.5)
eGFR: 82 mL/min/{1.73_m2} (ref >59)

## 2023-05-28 ENCOUNTER — Ambulatory Visit (INDEPENDENT_AMBULATORY_CARE_PROVIDER_SITE_OTHER): Payer: 59 | Admitting: Nurse Practitioner

## 2023-05-28 ENCOUNTER — Encounter: Payer: Self-pay | Admitting: Nurse Practitioner

## 2023-05-28 ENCOUNTER — Other Ambulatory Visit: Payer: Self-pay | Admitting: Nurse Practitioner

## 2023-05-28 VITALS — BP 126/82 | HR 91 | Ht 64.0 in | Wt 181.4 lb

## 2023-05-28 DIAGNOSIS — Z111 Encounter for screening for respiratory tuberculosis: Secondary | ICD-10-CM

## 2023-05-28 DIAGNOSIS — E785 Hyperlipidemia, unspecified: Secondary | ICD-10-CM

## 2023-05-28 DIAGNOSIS — M797 Fibromyalgia: Secondary | ICD-10-CM | POA: Diagnosis not present

## 2023-05-28 DIAGNOSIS — E782 Mixed hyperlipidemia: Secondary | ICD-10-CM

## 2023-05-28 DIAGNOSIS — E1165 Type 2 diabetes mellitus with hyperglycemia: Secondary | ICD-10-CM | POA: Diagnosis not present

## 2023-05-28 DIAGNOSIS — Z0000 Encounter for general adult medical examination without abnormal findings: Secondary | ICD-10-CM | POA: Insufficient documentation

## 2023-05-28 DIAGNOSIS — E1169 Type 2 diabetes mellitus with other specified complication: Secondary | ICD-10-CM

## 2023-05-28 DIAGNOSIS — F430 Acute stress reaction: Secondary | ICD-10-CM

## 2023-05-28 DIAGNOSIS — H918X3 Other specified hearing loss, bilateral: Secondary | ICD-10-CM

## 2023-05-28 MED ORDER — LOSARTAN POTASSIUM 25 MG PO TABS: 25.0000 mg | ORAL_TABLET | Freq: Every day | ORAL | 3 refills | Status: DC

## 2023-05-28 MED ORDER — DULOXETINE HCL 60 MG PO CPEP: 60.0000 mg | ORAL_CAPSULE | Freq: Every day | ORAL | 3 refills | Status: AC

## 2023-05-28 MED ORDER — ROSUVASTATIN CALCIUM 20 MG PO TABS: 20.0000 mg | ORAL_TABLET | Freq: Every day | ORAL | 3 refills | Status: AC

## 2023-05-28 MED ORDER — GABAPENTIN 300 MG PO CAPS: 300.0000 mg | ORAL_CAPSULE | Freq: Three times a day (TID) | ORAL | 6 refills | Status: DC

## 2023-05-28 MED ORDER — OZEMPIC (0.25 OR 0.5 MG/DOSE) 2 MG/3ML ~~LOC~~ SOPN: 1.0000 mg | PEN_INJECTOR | SUBCUTANEOUS | 3 refills | Status: DC

## 2023-05-28 NOTE — Assessment & Plan Note (Signed)
 Chronic condition. Discussed the importance of monitoring blood glucose levels and maintaining a balanced diet. Will refer for medication assistance with pharmacy to determine if eligible for PAP with Ozempic   - Continue current diabetes management

## 2023-05-28 NOTE — Assessment & Plan Note (Signed)
 Chronic condition since 1996, characterized by widespread musculoskeletal pain, fatigue, and cognitive difficulties. Symptoms are managed by listening to her body, adjusting activities based on weather, and avoiding overexertion. She avoids opioid medications due to past dependency and withdrawal. Discussed stress management and potential autoimmune component. - Continue current management strategies - Avoid overexertion and manage activities based on weather conditions

## 2023-05-28 NOTE — Assessment & Plan Note (Signed)
 Hearing loss in the right ear. She has seen a specialist. No new interventions discussed. - No new interventions

## 2023-05-28 NOTE — Progress Notes (Signed)
 Last eye exam: past due need one this year [facility] [employer]  Last PAP: had hysterectomy  Vaccines: coivd/pnuemonia/shingles    Catheline Doing, DNP, AGNP-c [facility] [address] [place], [place] [address] Main Office [phone]  BP 126/82   Pulse 91   Ht 5' 4 (1.626 m)   Wt 181 lb 6.4 oz (82.3 kg)   BMI 31.14 kg/m    Subjective:    Patient ID: [name], female    DOB: [birth year], 58 y.o.   MRN: [id number]  HPI: [name] is a 58 y.o. female presenting on 05/28/2023 for comprehensive medical examination.   History of Present Illness [name] Katharine Rochefort is a 58 year old female with fibromyalgia and diabetes who presents for her annual exam.  She has a history of fibromyalgia since 1996, initially presenting with brain fog and severe physical limitations, including an inability to walk. She underwent physical therapy to regain mobility. Over the years, she has learned to manage her symptoms by listening to her body, adjusting her activities according to weather conditions, and ensuring adequate rest. She avoids overexertion and has previously been on morphine but prefers not to use it due to withdrawal concerns. Opiates do not completely alleviate her pain. She experiences fibromyalgia-related chest pain, which she distinguishes from anxiety-related pain. No unexplained cough, fever, night sweats, shortness of breath, or unintentional weight loss.  She manages her diabetes with Ozempic , currently on a 0.5 mg dose, and is on a waitlist to increase to 1 mg. She monitors her sugar intake as it affects her bowel habits, which have improved recently. She also takes rosuvastatin  and Cymbalta .  She has a history of hearing loss in her right ear and has seen a specialist for this issue. She denies any recent changes in her hearing or vision.  Her family history includes ataxia in her father, brother, and niece, while fibromyalgia  is prevalent among the women in her family.   She has recently lost her job and will be losing her health insurance at the end of the month. She is interested in patient assistance with Ozempic , if possible, to avoid disruption of her medication.  Pertinent items are noted in HPI.    Most Recent Depression Screen:     05/28/2023    9:56 AM 02/16/2023   10:12 AM 02/16/2023   10:11 AM 09/16/2021    9:56 AM 06/24/2021    3:24 PM  Depression screen PHQ 2/9  Decreased Interest 0 0 0 0 0  Down, Depressed, Hopeless 0 1 1 0 0  PHQ - 2 Score 0 1 1 0 0  Altered sleeping  3  2   Tired, decreased energy  3  2   Change in appetite  1  1   Feeling bad or failure about yourself   0  0   Trouble concentrating  1  2   Moving slowly or fidgety/restless  0  1   Suicidal thoughts  0  0   PHQ-9 Score  9  8   Difficult doing work/chores  Very difficult      Most Recent Anxiety Screen:     09/16/2021    9:56 AM  GAD 7 : Generalized Anxiety Score  Nervous, Anxious, on Edge 1  Control/stop worrying 0  Worry too much - different things 1  Trouble relaxing 2  Restless 0  Easily annoyed or irritable 0  Afraid - awful might happen 0  Total GAD 7 Score 4   Most Recent Fall Screen:    05/28/2023    9:56 AM 02/16/2023   10:11 AM 06/24/2021    3:24 PM 12/24/2020    1:47 PM 10/19/2019    8:34 AM  Fall Risk   Falls in the past year? 0 0 0 0 1  Number falls in past yr: 0 0 0 0 1  Injury with Fall? 0 0 1 0 1  Risk for fall due to : No Fall Risks No Fall Risks No Fall Risks No Fall Risks Impaired balance/gait  Follow up Falls evaluation completed Falls evaluation completed Falls evaluation completed Falls evaluation completed     Past medical history, surgical history, medications, allergies, family history and social history reviewed with patient today and changes made to appropriate areas of the chart.  Past Medical History:  Past Medical History:  Diagnosis Date   Arthritis    Arthritis of knee,  right    Bursitis    Carpal tunnel syndrome    Chronic headaches    Constipation    Degenerative disc disease, lumbar    Depression    Diabetes (HCC)    diet controlled, no meds   Eye pressure    Fibromyalgia    GAD (generalized anxiety disorder) 04/08/2018   Hematoma    left leg, per patient    Insomnia    Sciatica    Snoring    Tendonitis    TMJ (dislocation of temporomandibular joint)    Type 2 diabetes mellitus in remission (HCC)    Medications:  Current Outpatient Medications on File Prior to Visit  Medication Sig   Cholecalciferol (VITAMIN D  PO) Take 2 tablets by mouth.   fluticasone  (FLONASE ) 50 MCG/ACT nasal spray Place 2 sprays into both nostrils daily.   glucose blood (CONTOUR NEXT TEST) test strip Use as instructed   ibuprofen  (ADVIL ) 200 MG tablet Take 400 mg by mouth as needed.   ONETOUCH DELICA LANCETS FINE MISC Test once daily. Has one touch verio meter   No current facility-administered medications on file prior to visit.   Surgical History:  Past Surgical History:  Procedure Laterality Date   ABDOMINAL HYSTERECTOMY     CESAREAN SECTION     x3   cyst from hand     DENTAL SURGERY     KNEE SURGERY     right   TEMPOROMANDIBULAR JOINT SURGERY     TONSILLECTOMY     Allergies:  Allergies  Allergen Reactions   Amoxicillin Swelling   Doxycycline Nausea And Vomiting   Penicillins Swelling   Family History:  Family History  Problem Relation Age of Onset   Hypertension Mother    Glaucoma Mother    Cataracts Mother    Diabetes Father    Hypertension Father    Fibromyalgia Daughter    Migraines Daughter    Migraines Daughter    Asthma Daughter    Breast cancer Maternal Grandmother    Breast cancer Cousin    Glaucoma Brother    Ataxia Brother    Colon cancer Neg Hx    Esophageal cancer Neg Hx    Pancreatic cancer Neg Hx    Rectal cancer Neg Hx    Stomach cancer Neg Hx    Colon polyps Neg Hx        Objective:    BP 126/82   Pulse 91    Ht 5' 4 (1.626 m)   Wt 181 lb 6.4 oz (82.3 kg)  BMI 31.14 kg/m   Wt Readings from Last 3 Encounters:  05/28/23 181 lb 6.4 oz (82.3 kg)  02/16/23 179 lb 12.8 oz (81.6 kg)  01/29/22 190 lb 9.6 oz (86.5 kg)    Physical Exam Vitals and nursing note reviewed.  Constitutional:      General: She is not in acute distress.    Appearance: Normal appearance.  HENT:     Head: Normocephalic and atraumatic.     Right Ear: Hearing, tympanic membrane, ear canal and external ear normal.     Left Ear: Hearing, tympanic membrane, ear canal and external ear normal.     Nose: Nose normal.     Right Sinus: No maxillary sinus tenderness or frontal sinus tenderness.     Left Sinus: No maxillary sinus tenderness or frontal sinus tenderness.     Mouth/Throat:     Lips: Pink.     Mouth: Mucous membranes are moist.     Pharynx: Oropharynx is clear.  Eyes:     General: Lids are normal. Vision grossly intact.     Extraocular Movements: Extraocular movements intact.     Conjunctiva/sclera: Conjunctivae normal.     Pupils: Pupils are equal, round, and reactive to light.     Funduscopic exam:    Right eye: Red reflex present.        Left eye: Red reflex present.    Visual Fields: Right eye visual fields normal and left eye visual fields normal.  Neck:     Thyroid : No thyromegaly.     Vascular: No carotid bruit.  Cardiovascular:     Rate and Rhythm: Normal rate and regular rhythm.     Chest Wall: PMI is not displaced.     Pulses: Normal pulses.          Dorsalis pedis pulses are 2+ on the right side and 2+ on the left side.       Posterior tibial pulses are 2+ on the right side and 2+ on the left side.     Heart sounds: Normal heart sounds. No murmur heard. Pulmonary:     Effort: Pulmonary effort is normal. No respiratory distress.     Breath sounds: Normal breath sounds.  Abdominal:     General: Abdomen is flat. Bowel sounds are normal. There is no distension.     Palpations: Abdomen is soft. There  is no hepatomegaly, splenomegaly or mass.     Tenderness: There is no abdominal tenderness. There is no right CVA tenderness, left CVA tenderness, guarding or rebound.  Musculoskeletal:        General: Normal range of motion.     Cervical back: Full passive range of motion without pain, normal range of motion and neck supple. No tenderness.     Right lower leg: No edema.     Left lower leg: No edema.  Feet:     Left foot:     Toenail Condition: Left toenails are normal.  Lymphadenopathy:     Cervical: No cervical adenopathy.     Upper Body:     Right upper body: No supraclavicular adenopathy.     Left upper body: No supraclavicular adenopathy.  Skin:    General: Skin is warm and dry.     Capillary Refill: Capillary refill takes less than 2 seconds.     Nails: There is no clubbing.  Neurological:     General: No focal deficit present.     Mental Status: She is alert and oriented to person,  place, and time.     GCS: GCS eye subscore is 4. GCS verbal subscore is 5. GCS motor subscore is 6.     Sensory: Sensation is intact.     Motor: Motor function is intact.     Coordination: Coordination is intact.     Gait: Gait is intact.     Deep Tendon Reflexes: Reflexes are normal and symmetric.  Psychiatric:        Attention and Perception: Attention normal.        Mood and Affect: Mood normal.        Speech: Speech normal.        Behavior: Behavior normal. Behavior is cooperative.        Thought Content: Thought content normal.        Cognition and Memory: Cognition and memory normal.        Judgment: Judgment normal.      Results for orders placed or performed in visit on 02/16/23  CBC with Differential/Platelet   Collection Time: 02/16/23 11:26 AM  Result Value Ref Range   WBC 5.9 3.4 - 10.8 x10E3/uL   RBC 4.66 3.77 - 5.28 x10E6/uL   Hemoglobin 13.1 11.1 - 15.9 g/dL   Hematocrit 59.0 65.9 - 46.6 %   MCV 88 79 - 97 fL   MCH 28.1 26.6 - 33.0 pg   MCHC 32.0 31.5 - 35.7 g/dL    RDW 86.0 88.2 - 84.5 %   Platelets 235 150 - 450 x10E3/uL   Neutrophils 64 Not Estab. %   Lymphs 29 Not Estab. %   Monocytes 6 Not Estab. %   Eos 1 Not Estab. %   Basos 0 Not Estab. %   Neutrophils Absolute 3.7 1.4 - 7.0 x10E3/uL   Lymphocytes Absolute 1.7 0.7 - 3.1 x10E3/uL   Monocytes Absolute 0.4 0.1 - 0.9 x10E3/uL   EOS (ABSOLUTE) 0.1 0.0 - 0.4 x10E3/uL   Basophils Absolute 0.0 0.0 - 0.2 x10E3/uL   Immature Granulocytes 0 Not Estab. %   Immature Grans (Abs) 0.0 0.0 - 0.1 x10E3/uL  CMP14+EGFR   Collection Time: 02/16/23 11:26 AM  Result Value Ref Range   Glucose 105 (H) 70 - 99 mg/dL   BUN 13 6 - 24 mg/dL   Creatinine, Ser 9.17 0.57 - 1.00 mg/dL   eGFR 82 >40 fO/fpw/8.26   BUN/Creatinine Ratio 16 9 - 23   Sodium 141 134 - 144 mmol/L   Potassium 3.8 3.5 - 5.2 mmol/L   Chloride 103 96 - 106 mmol/L   CO2 22 20 - 29 mmol/L   Calcium  9.9 8.7 - 10.2 mg/dL   Total Protein 7.0 6.0 - 8.5 g/dL   Albumin 4.5 3.8 - 4.9 g/dL   Globulin, Total 2.5 1.5 - 4.5 g/dL   Bilirubin Total 0.5 0.0 - 1.2 mg/dL   Alkaline Phosphatase 127 (H) 44 - 121 IU/L   AST 25 0 - 40 IU/L   ALT 37 (H) 0 - 32 IU/L  Lipid panel   Collection Time: 02/16/23 11:26 AM  Result Value Ref Range   Cholesterol, Total 158 100 - 199 mg/dL   Triglycerides 62 0 - 149 mg/dL   HDL 72 >60 mg/dL   VLDL Cholesterol Cal 12 5 - 40 mg/dL   LDL Chol Calc (NIH) 74 0 - 99 mg/dL   Chol/HDL Ratio 2.2 0.0 - 4.4 ratio  Microalbumin / creatinine urine ratio   Collection Time: 02/16/23 11:26 AM  Result Value Ref Range   Creatinine,  Urine 123.0 Not Estab. mg/dL   Microalbumin, Urine 4.4 Not Estab. ug/mL   Microalb/Creat Ratio 4 0 - 29 mg/g creat       Assessment & Plan:   Problem List Items Addressed This Visit     Fibromyalgia   Chronic condition since 1996, characterized by widespread musculoskeletal pain, fatigue, and cognitive difficulties. Symptoms are managed by listening to her body, adjusting activities based on  weather, and avoiding overexertion. She avoids opioid medications due to past dependency and withdrawal. Discussed stress management and potential autoimmune component. - Continue current management strategies - Avoid overexertion and manage activities based on weather conditions      Relevant Medications   DULoxetine  (CYMBALTA ) 60 MG capsule   gabapentin  (NEURONTIN ) 300 MG capsule   Asymmetrical hearing loss   Hearing loss in the right ear. She has seen a specialist. No new interventions discussed. - No new interventions       Mixed hyperlipidemia   Relevant Medications   losartan  (COZAAR ) 25 MG tablet   rosuvastatin  (CRESTOR ) 20 MG tablet   Other Relevant Orders   CBC with Differential/Platelet   CMP14+EGFR   Hemoglobin A1c   Lipid panel   Microalbumin / creatinine urine ratio   AMB Referral [facility] Care Management   Type 2 diabetes mellitus with hyperglycemia, without long-term current use of insulin (HCC)   Chronic condition. Discussed the importance of monitoring blood glucose levels and maintaining a balanced diet. Will refer for medication assistance with pharmacy to determine if eligible for PAP with Ozempic   - Continue current diabetes management      Relevant Medications   losartan  (COZAAR ) 25 MG tablet   rosuvastatin  (CRESTOR ) 20 MG tablet   OZEMPIC , 0.25 OR 0.5 MG/DOSE, 2 MG/3ML SOPN   Other Relevant Orders   CBC with Differential/Platelet   CMP14+EGFR   Hemoglobin A1c   Lipid panel   Microalbumin / creatinine urine ratio   AMB Referral [facility] Care Management   Encounter for annual physical exam - Primary    General Health Maintenance Routine health maintenance discussed. Emphasized regular screenings and preventive measures. - Ensure refills on all medications - Sign up for Ozempic  patient assistance program - Schedule eye exam for insurance purposes - Monitor for any changes in health status  Follow-up - Pharmacist to call next week to assist with patient  assistance paperwork - Monitor lab results for tuberculin test and notify patient of results      Relevant Orders   CBC with Differential/Platelet   CMP14+EGFR   Hemoglobin A1c   Lipid panel   Stress response   Relevant Medications   DULoxetine  (CYMBALTA ) 60 MG capsule   Hyperlipidemia associated with type 2 diabetes mellitus (HCC)   Relevant Medications   losartan  (COZAAR ) 25 MG tablet   rosuvastatin  (CRESTOR ) 20 MG tablet   OZEMPIC , 0.25 OR 0.5 MG/DOSE, 2 MG/3ML SOPN   Other Relevant Orders   CBC with Differential/Platelet   CMP14+EGFR   Hemoglobin A1c   Lipid panel   Microalbumin / creatinine urine ratio   AMB Referral [facility] Care Management   Other Visit Diagnoses       Screening for tuberculosis       Relevant Orders   QuantiFERON-TB Gold Plus         Follow up plan: Return in about 6 months (around 11/25/2023) for Med Management 30.  NEXT PREVENTATIVE PHYSICAL DUE IN 1 YEAR.  PATIENT COUNSELING PROVIDED FOR ALL ADULT PATIENTS: A well balanced diet low in saturated  fats, cholesterol, and moderation in carbohydrates.  This can be as simple as monitoring portion sizes and cutting back on sugary beverages such as soda and juice to start with.    Daily water consumption of at least 64 ounces.  Physical activity at least 180 minutes per week.  If just starting out, start 10 minutes a day and work your way up.   This can be as simple as taking the stairs instead of the elevator and walking 2-3 laps around the office  purposefully every day.   STD protection, partner selection, and regular testing if high risk.  Limited consumption of alcoholic beverages if alcohol is consumed. For men, I recommend no more than 14 alcoholic beverages per week, spread out throughout the week (max 2 per day). Avoid binge drinking or consuming large quantities of alcohol in one setting.  Please let me know if you feel you may need help with reduction or quitting alcohol consumption.    Avoidance of nicotine, if used. Please let me know if you feel you may need help with reduction or quitting nicotine use.   Daily mental health attention. This can be in the form of 5 minute daily meditation, prayer, journaling, yoga, reflection, etc.  Purposeful attention to your emotions and mental state can significantly improve your overall wellbeing  and  Health.  Please know that I am here to help you with all of your health care goals and am happy to work with you to find a solution that works best for you.  The greatest advice I have received with any changes in life are to take it one step at a time, that even means if all you can focus on is the next 60 seconds, then do that and celebrate your victories.  With any changes in life, you will have set backs, and that is OK. The important thing to remember is, if you have a set back, it is not a failure, it is an opportunity to try again! Screening Testing Mammogram Every 1 -2 years based on history and risk factors Starting at age 25 Pap Smear Ages 21-39 every 3 years Ages 29-65 every 5 years with HPV testing More frequent testing may be required based on results and history Colon Cancer Screening Every 1-10 years based on test performed, risk factors, and history Starting at age 77 Bone Density Screening Every 2-10 years based on history Starting at age 41 for women Recommendations for men differ based on medication usage, history, and risk factors AAA Screening One time ultrasound Men 82-70 years old who have every smoked Lung Cancer Screening Low Dose Lung CT every 12 months Age 29-80 years with a 30 pack-year smoking history who still smoke or who have quit within the last 15 years   Screening Labs Routine  Labs: Complete Blood Count (CBC), Complete Metabolic Panel (CMP), Cholesterol (Lipid Panel) Every 6-12 months based on history and medications May be recommended more frequently based on current conditions or  previous results Hemoglobin A1c Lab Every 3-12 months based on history and previous results Starting at age 54 or earlier with diagnosis of diabetes, high cholesterol, BMI >26, and/or risk factors Frequent monitoring for patients with diabetes to ensure blood sugar control Thyroid  Panel (TSH) Every 6 months based on history, symptoms, and risk factors May be repeated more often if on medication HIV One time testing for all patients 92 and older May be repeated more frequently for patients with increased risk factors or exposure Hepatitis  C One time testing for all patients 60 and older May be repeated more frequently for patients with increased risk factors or exposure Gonorrhea, Chlamydia Every 12 months for all sexually active persons 13-24 years Additional monitoring may be recommended for those who are considered high risk or who have symptoms Every 12 months for any woman on birth control, regardless of sexual activity PSA Men 91-53 years old with risk factors Additional screening may be recommended from age 82-69 based on risk factors, symptoms, and history  Vaccine Recommendations Tetanus Booster All adults every 10 years Flu Vaccine All patients 6 months and older every year COVID Vaccine All patients 12 years and older Initial dosing with booster May recommend additional booster based on age and health history HPV Vaccine 2 doses all patients age 59-26 Dosing may be considered for patients over 26 Shingles Vaccine (Shingrix) 2 doses all adults 55 years and older Pneumonia (Pneumovax 68) All adults 65 years and older May recommend earlier dosing based on health history One year apart from Prevnar 14 Pneumonia (Prevnar 83) All adults 65 years and older Dosed 1 year after Pneumovax 23 Pneumonia (Prevnar 20) One time alternative to the two dosing of 13 and 23 For all adults with initial dose of 23, 20 is recommended 1 year later For all adults with initial dose of  13, 23 is still recommended as second option 1 year later

## 2023-05-28 NOTE — Patient Instructions (Signed)
 I have sent the referral to the care management pharmacist to look into the patient assistance program to see if we can get this for your for free.   We will work to make sure we can find affordable options for your medications once your insurance runs out. I also recommend looking into Medicaid to see if you are eligible.

## 2023-05-28 NOTE — Telephone Encounter (Signed)
 Did you want her to go back down on her mg of Ozempic 

## 2023-05-28 NOTE — Assessment & Plan Note (Signed)
  General Health Maintenance Routine health maintenance discussed. Emphasized regular screenings and preventive measures. - Ensure refills on all medications - Sign up for Ozempic  patient assistance program - Schedule eye exam for insurance purposes - Monitor for any changes in health status  Follow-up - Pharmacist to call next week to assist with patient assistance paperwork - Monitor lab results for tuberculin test and notify patient of results

## 2023-05-29 LAB — LIPID PANEL
Chol/HDL Ratio: 2.1 {ratio} (ref 0.0–4.4)
Cholesterol, Total: 160 mg/dL (ref 100–199)
HDL: 76 mg/dL (ref >39)
LDL Chol Calc (NIH): 72 mg/dL (ref 0–99)
Triglycerides: 58 mg/dL (ref 0–149)
VLDL Cholesterol Cal: 12 mg/dL (ref 5–40)

## 2023-05-29 LAB — CMP14+EGFR
ALT: 80 [IU]/L — ABNORMAL HIGH (ref 0–32)
AST: 61 [IU]/L — ABNORMAL HIGH (ref 0–40)
Albumin: 4.8 g/dL (ref 3.8–4.9)
Alkaline Phosphatase: 131 [IU]/L — ABNORMAL HIGH (ref 44–121)
BUN/Creatinine Ratio: 11 (ref 9–23)
BUN: 10 mg/dL (ref 6–24)
Bilirubin Total: 0.3 mg/dL (ref 0.0–1.2)
CO2: 25 mmol/L (ref 20–29)
Calcium: 9.9 mg/dL (ref 8.7–10.2)
Chloride: 104 mmol/L (ref 96–106)
Creatinine, Ser: 0.89 mg/dL (ref 0.57–1.00)
Globulin, Total: 2.4 g/dL (ref 1.5–4.5)
Glucose: 99 mg/dL (ref 70–99)
Potassium: 4.4 mmol/L (ref 3.5–5.2)
Sodium: 146 mmol/L — ABNORMAL HIGH (ref 134–144)
Total Protein: 7.2 g/dL (ref 6.0–8.5)
eGFR: 75 mL/min/{1.73_m2} (ref >59)

## 2023-05-29 LAB — CBC WITH DIFFERENTIAL/PLATELET
Basophils Absolute: 0 10*3/uL (ref 0.0–0.2)
Basos: 0 %
EOS (ABSOLUTE): 0.1 10*3/uL (ref 0.0–0.4)
Eos: 1 %
Hematocrit: 42.5 % (ref 34.0–46.6)
Hemoglobin: 13.8 g/dL (ref 11.1–15.9)
Immature Grans (Abs): 0 10*3/uL (ref 0.0–0.1)
Immature Granulocytes: 0 %
Lymphocytes Absolute: 2.5 10*3/uL (ref 0.7–3.1)
Lymphs: 36 %
MCH: 28.9 pg (ref 26.6–33.0)
MCHC: 32.5 g/dL (ref 31.5–35.7)
MCV: 89 fL (ref 79–97)
Monocytes Absolute: 0.5 10*3/uL (ref 0.1–0.9)
Monocytes: 7 %
Neutrophils Absolute: 4 10*3/uL (ref 1.4–7.0)
Neutrophils: 56 %
Platelets: 234 10*3/uL (ref 150–450)
RBC: 4.78 x10E6/uL (ref 3.77–5.28)
RDW: 13.4 % (ref 11.7–15.4)
WBC: 7 10*3/uL (ref 3.4–10.8)

## 2023-05-29 LAB — HEMOGLOBIN A1C
Est. average glucose Bld gHb Est-mCnc: 131 mg/dL
Hgb A1c MFr Bld: 6.2 % — ABNORMAL HIGH (ref 4.8–5.6)

## 2023-05-29 LAB — MICROALBUMIN / CREATININE URINE RATIO
Creatinine, Urine: 131.4 mg/dL
Microalb/Creat Ratio: 5 mg/g{creat} (ref 0–29)
Microalbumin, Urine: 6.5 ug/mL

## 2023-05-31 ENCOUNTER — Telehealth: Payer: Self-pay

## 2023-05-31 NOTE — Progress Notes (Signed)
 Care Guide Pharmacy Note  05/31/2023 Name: [name] MRN: [id number] DOB: Sep 05, 1965  Referred By: Annella Kief, NP Reason for referral: Care Coordination (Outreach to schedule with Pharm d )   [name] is a 58 y.o. year old female who is a primary care patient of Early, Adriane Albe, NP.  [name] was referred to the pharmacist for assistance related to: DMII  Successful contact was made with the patient to discuss pharmacy services including being ready for the pharmacist to call at least 5 minutes before the scheduled appointment time and to have medication bottles and any blood pressure readings ready for review. The patient agreed to meet with the pharmacist via telephone visit on (date/time).06/01/2023  Lenton Rail , RMA     [place]  [facility], [facility] Guide  Direct Dial: [phone]  Website: [email or web address]

## 2023-06-01 ENCOUNTER — Other Ambulatory Visit (INDEPENDENT_AMBULATORY_CARE_PROVIDER_SITE_OTHER): Payer: 59

## 2023-06-01 DIAGNOSIS — E1165 Type 2 diabetes mellitus with hyperglycemia: Secondary | ICD-10-CM

## 2023-06-01 MED ORDER — OZEMPIC (1 MG/DOSE) 2 MG/1.5ML ~~LOC~~ SOPN: 1.0000 mg | PEN_INJECTOR | SUBCUTANEOUS | 1 refills | Status: AC

## 2023-06-01 NOTE — Progress Notes (Signed)
06/01/2023 Name: [name] MRN: [id number] DOB: October 09, 1965  Chief Complaint  Patient presents with   Medication Assistance    Ozempic    [name] is a 58 y.o. year old female who presented for a telephone visit.   They were referred to the pharmacist by their PCP for assistance in managing medication access.    Subjective:  Care Team: Primary Care Provider: Tollie Eth, NP ; Next Scheduled Visit: 11/25/23  Medication Access/Adherence  Current Pharmacy:  [id number] [place], [place] - [address] AT [facility] [address] [place] [address] Phone: [phone] Fax: [phone]  [id number] - [place], [place] - [address] [place] [address] Phone: [phone] Fax: [phone]   Patient reports affordability concerns with their medications: Yes  - losing her insurance at the end of this month Patient reports access/transportation concerns to their pharmacy: No  Patient reports adherence concerns with their medications:  No     Medication Management:  -Losing insurance at the end of this month as it was through her former employer -Concerned about what that is going to do about the cost of her medications, particularly ozempic  Objective:  Lab Results  Component Value Date   HGBA1C 6.2 (H) 05/28/2023    Lab Results  Component Value Date   CREATININE 0.89 05/28/2023   BUN 10 05/28/2023   NA 146 (H) 05/28/2023   K 4.4 05/28/2023   CL 104 05/28/2023   CO2 25 05/28/2023    Lab Results  Component Value Date   CHOL 160 05/28/2023   HDL 76 05/28/2023   LDLCALC 72 05/28/2023   TRIG 58 05/28/2023   CHOLHDL 2.1 05/28/2023    Medications Reviewed Today     Reviewed by Sherrill Raring, RPH (Pharmacist) on 06/01/23 at 1436  Med List Status: <None>   Medication Order Taking? Sig Documenting Provider Last Dose Status Informant  Cholecalciferol  (VITAMIN D PO) [id number]  Take 2 tablets by mouth. [provider]  Active   DULoxetine (CYMBALTA) 60 MG capsule [id number] Yes Take 1 capsule (60 mg total) by mouth daily. Tollie Eth, NP Taking Active   fluticasone (FLONASE) 50 MCG/ACT nasal spray [id number] Yes Place 2 sprays into both nostrils daily. Jake Shark, PA-C Taking Active            Med Note Sherilyn Cooter, [facility]   Thu Jan 29, 2022 11:26 AM) Prn last dose two weeks ago  gabapentin (NEURONTIN) 300 MG capsule [id number] Yes Take 1-2 capsules (300-600 mg total) by mouth 3 (three) times daily. Tollie Eth, NP Taking Active   glucose blood (CONTOUR NEXT TEST) test strip [id number]  Use as instructed Jake Shark, PA-C  Active   ibuprofen (ADVIL) 200 MG tablet [id number]  Take 400 mg by mouth as needed. [provider]  Active   losartan (COZAAR) 25 MG tablet [id number] Yes Take 1 tablet (25 mg total) by mouth daily. Tollie Eth, NP Taking Active   [facility] LANCETS FINE MISC [id number]  Test once daily. Has one touch verio meter Henson, Vickie L, NP-C  Active   rosuvastatin (CRESTOR) 20 MG tablet [id number] Yes Take 1 tablet (20 mg total) by mouth daily. Tollie Eth, NP Taking Active   Semaglutide, 1 MG/DOSE, (OZEMPIC, 1 MG/DOSE,) 2 MG/1.5ML SOPN [id number] Yes Inject 1 mg into the skin once a week. Tysinger, Kermit Balo, PA-C Taking  Active               Assessment/Plan:   Medication Management: -Patient should qualify for Ozempic PAP through [employer] once commercial insurance coverage has ended (06/19/23), will submit PAP via online portal on first business day of that month (3/3/) -Price checked patient's other medications with the [employer] Drug List and obtained the following info:     Flonase: $10/30DS     Gabapentin 300mg : 90 capsules $5 (patient needs 180 capsules for 1 month supply so should be $10)     Losartan 25mg : $5/30DS     Rosuvastatin 20mg : $5/3DS   Follow Up Plan: 06/21/23 to submit  ozempic app  Sherrill Raring, PharmD Clinical Pharmacist [phone]

## 2023-06-01 NOTE — Addendum Note (Signed)
Addended by: Herminio Commons A on: 06/01/2023 09:04 AM   Modules accepted: Orders

## 2023-06-04 LAB — QUANTIFERON-TB GOLD PLUS
QuantiFERON Nil Value: 0.05 [IU]/mL
QuantiFERON TB1 Ag Value: 0.12 [IU]/mL
QuantiFERON TB2 Ag Value: 0.16 [IU]/mL

## 2023-06-07 ENCOUNTER — Encounter: Payer: Self-pay | Admitting: Nurse Practitioner

## 2023-06-08 NOTE — Telephone Encounter (Signed)
 Spoke with patient. Duloxetine available on goodrx for $40 through her pharmacy per 90DS. Patient voiced understanding. Instructed to call with any other concerns.

## 2023-06-09 ENCOUNTER — Encounter: Payer: Self-pay | Admitting: Nurse Practitioner

## 2023-06-16 ENCOUNTER — Encounter: Payer: Self-pay | Admitting: Nurse Practitioner

## 2023-06-16 NOTE — Telephone Encounter (Signed)
 Appt scheduled

## 2023-06-17 ENCOUNTER — Encounter: Payer: Self-pay | Admitting: Nurse Practitioner

## 2023-06-17 ENCOUNTER — Ambulatory Visit: Payer: 59 | Admitting: Nurse Practitioner

## 2023-06-17 VITALS — BP 124/80 | HR 101 | Wt 181.8 lb

## 2023-06-17 DIAGNOSIS — B360 Pityriasis versicolor: Secondary | ICD-10-CM | POA: Diagnosis not present

## 2023-06-17 DIAGNOSIS — R140 Abdominal distension (gaseous): Secondary | ICD-10-CM

## 2023-06-17 MED ORDER — ITRACONAZOLE 100 MG PO CAPS: 100.0000 mg | ORAL_CAPSULE | Freq: Every day | ORAL | 0 refills | Status: DC

## 2023-06-17 MED ORDER — KETOCONAZOLE 2 % EX SHAM: MEDICATED_SHAMPOO | CUTANEOUS | 0 refills | Status: DC

## 2023-06-17 MED ORDER — KETOCONAZOLE 2 % EX SHAM: 1.0000 | MEDICATED_SHAMPOO | CUTANEOUS | 0 refills | Status: DC

## 2023-06-17 NOTE — Progress Notes (Signed)
 Brianna Eth, DNP, AGNP-c [facility] [address] [place], [place] [address] [phone]   ACUTE VISIT- ESTABLISHED PATIENT  Blood pressure 124/80, pulse (!) 101, weight 181 lb 12.8 oz (82.5 kg).  Subjective:  HPI [name] [name] is a 58 y.o. female presents to day for evaluation of acute concern(s).   History of Present Illness [name] [place] Stenberg is a 58 year old female who presents with a rash and gastrointestinal symptoms.  She has been experiencing a rash for about a week. The rash initially appeared with lightness around it and has since spread to other areas, causing itching and burning sensations, particularly at night or when she gets hot. Her lips feel like sandpaper, and she had a few white spots on her upper lip that resolved quickly. No new foods, drinks, detergents, soaps, lotions, or face washes have been introduced. She recalls washing her husband's feet and wonders if this could be related. She has tried using a jock itch cream on her face, which reduced itching but coincided with the rash spreading to other areas. No swelling in her mouth, gums, tongue, or lips is noted.  Gastrointestinal symptoms began around the same time as the rash. She experiences gas, burping, and small bowel movements, despite taking Gas-X without relief. She attributes these symptoms to irregular eating patterns, often not eating all day until dinner and then overeating, followed by lying down to sleep. She is on Ozempic and acknowledges that her eating habits might be contributing to her symptoms.  She took gabapentin last Tuesday, which she identifies as the only new medication around the time her symptoms began. She believes it should be out of her system by now.  She is concerned about her immune system being compromised due to stress and lack of sleep, as she is not getting much rest at night because she assists her husband, who has short-term memory issues,  with using the urinal.  ROS negative except for what is listed in HPI. History, Medications, Surgery, SDOH, and Family History reviewed and updated as appropriate.  Objective:  Physical Exam Vitals and nursing note reviewed.  HENT:     Head: Normocephalic.     Mouth/Throat:     Mouth: Mucous membranes are moist.     Comments: Woods lamp reveals yellow/green glow to the lips and tongue.  Eyes:     Conjunctiva/sclera: Conjunctivae normal.  Cardiovascular:     Rate and Rhythm: Normal rate and regular rhythm.     Pulses: Normal pulses.     Heart sounds: Normal heart sounds.  Pulmonary:     Effort: Pulmonary effort is normal.     Breath sounds: Normal breath sounds.  Abdominal:     General: Bowel sounds are normal. There is no distension.     Palpations: Abdomen is soft.     Tenderness: There is no abdominal tenderness. There is no guarding.  Lymphadenopathy:     Cervical: No cervical adenopathy.  Skin:    General: Skin is warm and dry.     Capillary Refill: Capillary refill takes less than 2 seconds.     Findings: Rash present.     Comments: Rough, fine maculopapular rash to the lips and circumferential area of the mouth extending down to the chin, neck, and onto the anterior chest wall at the neckline. Woods lamp reveals yellow/green glow indicating fungal infection.    Neurological:     Mental Status: She is alert and oriented to person, place, and  time.  Psychiatric:        Mood and Affect: Mood normal.         Assessment & Plan:   Problem List Items Addressed This Visit     Tinea versicolor - Primary   Presents with a pruritic and burning rash for about a week, spreading to the face, neck, and possibly the mouth, evidenced by sandpaper-like lips and lesions on the tongue. Tried over-the-counter antifungal cream with partial relief. Suspected fungal infection, likely tinea, exacerbated by a potentially weakened immune system. Discussed risks of spreading, benefits of  antifungal treatment, and the need for good hygiene. Advised on preventive measures to avoid spreading to her husband. - Prescribe terbinafine 1% cream, apply a thin layer twice daily for two weeks - Prescribe antifungal wash, use once daily for a week, let it sit for three minutes before rinsing - Prescribe itraconazole capsules, daily for two weeks - Advise wash hands thoroughly after touching      Relevant Medications   itraconazole (SPORANOX) 100 MG capsule   ketoconazole (NIZORAL) 2 % shampoo   Abdominal bloating   Reports gas, burping, and small bowel movements coinciding with the onset of the rash. Symptoms may be related to dietary habits, including irregular eating patterns and overeating at night, especially while on Ozempic. Discussed the importance of regular meals and the potential impact of dietary habits on gastrointestinal symptoms. - Advise eating smaller, more frequent meals throughout the day - Adjust diet as needed based on symptom monitoring         Brianna Eth, DNP, AGNP-c

## 2023-06-17 NOTE — Patient Instructions (Addendum)
 Wash face with the shampoo once a day for at least one week. You may continue after this three days a week.   Apply the cream you have at home twice a day for 2 weeks. You can apply a good moisturizer after this.   I have sent in a capsule of antifungal medication that will work from the inside out to help heal.   Try the generic mucinex to see if this helps with your mom's cough. If it doesn't get better let us know.   Tinea Versicolor  Tinea versicolor is a common fungal infection. It causes a rash that looks like light or dark patches on the skin. The rash most often occurs on the chest, back, neck, or upper arms. This condition is more common during warm weather. Tinea versicolor usually does not cause any other problems than the rash. In most cases, the infection goes away in a few weeks with treatment. It may take a few months for the patches on your skin to return to your usual skin color. What are the causes? This condition occurs when a certain type of fungus (Malassezia furfur) that is normally present on the skin starts to grow too much. This fungus is a type of yeast. This condition cannot be passed from one person to another (is not contagious). What increases the risk? This condition is more likely to develop when certain factors are present, such as: Heat and humidity. Sweating too much. Hormone changes, such as those that occur when taking birth control pills. Oily skin. A weak disease-fighting system (immunesystem). What are the signs or symptoms? Symptoms of this condition include: A rash of light or dark patches on your skin. The rash may have: Patches of tan or pink spots (on light skin). Patches of white or brown spots (on dark skin). Patches of skin that do not tan. Well-marked edges. Scales on the discolored areas. Mild itching. There may also be no itching. How is this diagnosed? A health care provider can usually diagnose this condition by looking at your skin.  During the exam, he or she may use ultraviolet (UV) light to see how much of your skin has been affected. In some cases, a skin sample may be taken by scraping the rash. This sample will be viewed under a microscope to check for yeast overgrowth. How is this treated? Treatment for this condition may include: Dandruff shampoo that is applied to the affected skin during showers or bathing. Over-the-counter medicated skin cream, lotion, or soaps. Prescription antifungal medicine in the form of skin cream or pills. Medicine to help reduce itching. Follow these instructions at home: Use over-the-counter and prescription medicines only as told by your health care provider. Apply dandruff shampoo to the affected area as told by your health care provider. Do not scratch the affected area of skin. Avoid hot and humid conditions. Do not use tanning booths. Try to avoid sweating a lot. Contact a health care provider if: Your symptoms get worse. You have a fever. You have signs of infection such as: Redness, swelling, or pain at the site of your rash. Warmth coming from your rash. Fluid or blood coming from your rash. Pus or a bad smell coming from your rash. Your rash comes back (recurs) after treatment. Your rash does not improve with treatment and spreads to other parts of the body. Summary Tinea versicolor is a common fungal infection of the skin. It causes a rash that looks like light or dark patches on  the skin. The rash most often occurs on the chest, back, neck, or upper arms. A health care provider can usually diagnose this condition by looking at your skin. Treatment may include applying shampoo to the skin and taking or applying medicines. This information is not intended to replace advice given to you by your health care provider. Make sure you discuss any questions you have with your health care provider. Document Revised: 06/25/2020 Document Reviewed: 06/25/2020 Elsevier Patient  Education  2024 [employer].

## 2023-06-21 ENCOUNTER — Other Ambulatory Visit (INDEPENDENT_AMBULATORY_CARE_PROVIDER_SITE_OTHER): Payer: 59

## 2023-06-21 DIAGNOSIS — R140 Abdominal distension (gaseous): Secondary | ICD-10-CM | POA: Insufficient documentation

## 2023-06-21 DIAGNOSIS — E1165 Type 2 diabetes mellitus with hyperglycemia: Secondary | ICD-10-CM

## 2023-06-21 NOTE — Progress Notes (Signed)
   06/21/2023  Patient ID: [name], female   DOB: July 06, 1965, 58 y.o.   MRN: [id number]  Contacted patient via telephone to follow up on need for Ozempic assistance at request of PCP.   Confirmed patient is currently without insurance and should qualify for assistance. Online application through [employer] submitted, pending company decision.  Sherrill Raring, PharmD Clinical Pharmacist [phone]

## 2023-06-21 NOTE — Assessment & Plan Note (Signed)
 Presents with a pruritic and burning rash for about a week, spreading to the face, neck, and possibly the mouth, evidenced by sandpaper-like lips and lesions on the tongue. Tried over-the-counter antifungal cream with partial relief. Suspected fungal infection, likely tinea, exacerbated by a potentially weakened immune system. Discussed risks of spreading, benefits of antifungal treatment, and the need for good hygiene. Advised on preventive measures to avoid spreading to her husband. - Prescribe terbinafine 1% cream, apply a thin layer twice daily for two weeks - Prescribe antifungal wash, use once daily for a week, let it sit for three minutes before rinsing - Prescribe itraconazole capsules, daily for two weeks - Advise wash hands thoroughly after touching

## 2023-06-21 NOTE — Assessment & Plan Note (Signed)
 Reports gas, burping, and small bowel movements coinciding with the onset of the rash. Symptoms may be related to dietary habits, including irregular eating patterns and overeating at night, especially while on Ozempic. Discussed the importance of regular meals and the potential impact of dietary habits on gastrointestinal symptoms. - Advise eating smaller, more frequent meals throughout the day - Adjust diet as needed based on symptom monitoring

## 2023-06-24 ENCOUNTER — Telehealth: Payer: Self-pay

## 2023-06-24 NOTE — Progress Notes (Signed)
 Pharmacy Medication Assistance Program Note    06/24/2023  Patient ID: [name], female   DOB: April 15, 1966, 58 y.o.   MRN: [id number]     06/24/2023  [employer]reach [employer]  Manufacturer [employer] [facility] Ozempic  Type of Radiographer, therapeutic Assistance  Patient Assistance Determination Approved  Approval Start Date 06/23/2023  Approval End Date 04/19/2024     Signature

## 2023-06-24 NOTE — Telephone Encounter (Signed)
 PAP: Patient assistance application for Ozempic has been approved by PAP Companies: [employer] from 06/23/2023 to 04/19/2024. Medication should be delivered to PAP Delivery: Provider's office. For further shipping updates, please [employer] at [phone]. Patient ID is: [id number]

## 2023-07-14 ENCOUNTER — Other Ambulatory Visit: Payer: Self-pay | Admitting: Nurse Practitioner

## 2023-07-14 DIAGNOSIS — B360 Pityriasis versicolor: Secondary | ICD-10-CM

## 2023-07-21 ENCOUNTER — Telehealth: Payer: Self-pay | Admitting: Nurse Practitioner

## 2023-07-21 NOTE — Telephone Encounter (Signed)
 PAP OZEMPIC received, pt informed

## 2023-08-30 ENCOUNTER — Encounter: Payer: BC Managed Care – PPO | Admitting: Nurse Practitioner

## 2023-09-16 ENCOUNTER — Encounter: Payer: 59 | Admitting: Nurse Practitioner

## 2023-10-27 NOTE — Telephone Encounter (Signed)
 PAP received, pt informed

## 2023-11-25 ENCOUNTER — Encounter: Payer: 59 | Admitting: Nurse Practitioner

## 2023-12-17 ENCOUNTER — Ambulatory Visit: Payer: Self-pay

## 2023-12-17 NOTE — Telephone Encounter (Signed)
 FYI Only or Action Required?: FYI only for provider.  Patient was last seen in primary care on 06/17/2023 by Early, Camie BRAVO, NP.  Called Nurse Triage reporting Headache.  Symptoms began several weeks ago.  Interventions attempted: OTC medications: ibuprofen , Claritin and nasal spray.  Symptoms are: gradually worsening.  Triage Disposition: See Physician Within 24 Hours  Patient/caregiver understands and will follow disposition?: Yes                             Copied from CRM [id number]. Topic: Clinical - Red Word Triage >> Dec 17, 2023 12:12 PM Kevelyn M wrote: Red Word that prompted transfer to Nurse Triage: Severe headaches for 3 weeks sharp pain and pressure on different parts of the head comes and goes, but headache stays. Pressure comes in the back or top of head or temples. Reason for Disposition  [1] MODERATE headache (e.g., interferes with normal activities) AND [2] present > 24 hours AND [3] unexplained  (Exceptions: Pain medicines not tried, typical migraine, or headache part of viral illness.)  Answer Assessment - Initial Assessment Questions 1. LOCATION: Where does it hurt?      Sharp pains at top of head and temples, pressure at back of head 2. ONSET: When did the headache start? (e.g., minutes, hours, days)      3 weeks  3. PATTERN: Does the pain come and go, or has it been constant since it started?     Happens at night and during the day  4. SEVERITY: How bad is the pain? and What does it keep you from doing?  (e.g., Scale 1-10; mild, moderate, or severe)     Rates pain a 5 at this time 5. RECURRENT SYMPTOM: Have you ever had headaches before? If Yes, ask: When was the last time? and What happened that time?      Yes, about a year ago 6. CAUSE: What do you think is causing the headache?     Unsure, possibly related to fibromyalgia  7. MIGRAINE: Have you been diagnosed with migraine headaches? If Yes, ask: Is this headache  similar?      Past history of migraines, states it has been awhile 8. HEAD INJURY: Has there been any recent injury to your head?      Denies 9. OTHER SYMPTOMS: Do you have any other symptoms? (e.g., fever, stiff neck, eye pain, sore throat, cold symptoms)     Occasional blurred vision- denies at this time, chest pain- related to fibromyalgia, denies dizziness, denies fever, denies difficulty breathing, denies nasal congestion, denies nausea, denies speech changes, denies numbness, denies weakness    No availability in office today. Advised UC. Patient verbalized understanding and agreed to go.  Protocols used: [facility]

## 2023-12-21 ENCOUNTER — Ambulatory Visit: Payer: Self-pay

## 2023-12-21 NOTE — Telephone Encounter (Signed)
 FYI Only or Action Required?: FYI only for provider.  Patient was last seen in primary care on 06/17/2023 by Early, Camie BRAVO, NP.  Called Nurse Triage reporting Migraine.  Symptoms began several weeks ago.  Interventions attempted: OTC medications: Ibuprofen  and Rest, hydration, or home remedies.  Symptoms are: unchanged.  Triage Disposition: See PCP When Office is Open (Within 3 Days)  Patient/caregiver understands and will follow disposition?:      Copied from CRM #[id number]. Topic: Clinical - Red Word Triage >> Dec 21, 2023  9:56 AM Jasmin G wrote: [facility] that prompted transfer to Nurse Triage: Persistent headaches and high bp for 3 weeks, pt spoke to NT about the same matter on 8/29. Reason for Disposition  [1] MILD-MODERATE headache AND [2] present > 3 days (72 hours) AND [3] no improvement after using Care Advice  Answer Assessment - Initial Assessment Questions Patient called in on 8/29 for symptoms and was advised to UC, could not go due to an issue with her disabled husband. BP was 143/98 this morning. On a low dose BP medication. Ibuprofen  with mild relief if lays down and doesn't move. Hot flashes when the sharp pain occurs and moving around. Slight blurred vision when looking at something for a long period of time.   1. LOCATION: Where does it hurt?      Sharp pain in top of head, feels a squeezing in the back and sometimes in the front, headache pain in the temples. 2. ONSET: When did the headache start? (e.g., minutes, hours, days)       3 weeks   3. PATTERN: Does the pain come and go, or has it been constant since it started?     Headache in one centralized, sharp pain comes and goes   4. RECURRENT SYMPTOM: Have you ever had headaches before? If Yes, ask: When was the last time? and What happened that time?      Yes  5. OTHER SYMPTOMS: Do you have any other symptoms? (e.g., fever, stiff neck, eye pain, sore throat, cold symptoms)    No  Protocols  used: Headache-A-AH

## 2023-12-22 ENCOUNTER — Ambulatory Visit (INDEPENDENT_AMBULATORY_CARE_PROVIDER_SITE_OTHER): Payer: Self-pay | Admitting: Family Medicine

## 2023-12-22 ENCOUNTER — Encounter: Payer: Self-pay | Admitting: Family Medicine

## 2023-12-22 VITALS — BP 140/86 | HR 76 | Wt 192.6 lb

## 2023-12-22 DIAGNOSIS — I10 Essential (primary) hypertension: Secondary | ICD-10-CM

## 2023-12-22 MED ORDER — LOSARTAN POTASSIUM-HCTZ 50-12.5 MG PO TABS: 1.0000 | ORAL_TABLET | Freq: Every day | ORAL | 0 refills | Status: DC

## 2023-12-22 NOTE — Progress Notes (Signed)
   Name: [name]   Date of Visit: 12/22/23   Date of last visit with me: Visit date not found   CHIEF COMPLAINT:  Chief Complaint  Patient presents with   Acute Visit    Heachache and high bp. medication is not working right now.        HPI:  Discussed the use of AI scribe software for clinical note transcription with the patient, who gave verbal consent to proceed.  History of Present Illness   [name] is a 58 year old female with hypertension who presents with elevated blood pressure and headaches.  She has been experiencing elevated blood pressure readings at home, with the highest being 143/98 mmHg. She is currently on the lowest dose of losartan .  Her headaches are characterized by pressure and sharp pains, sometimes waking her at night. The pain is described as a band-like sensation at the back of her head and sharp pain at the top. There is a slight change in vision, but no need to retreat to a dark room, indicating the headaches are not migraine-like. She has been taking three over-the-counter ibuprofen  pills for the headaches, but this has not provided significant relief.         OBJECTIVE:       05/28/2023    9:56 AM  Depression screen PHQ 2/9  Decreased Interest 0  Down, Depressed, Hopeless 0  PHQ - 2 Score 0     BP Readings from Last 3 Encounters:  12/22/23 (!) 140/86  06/17/23 124/80  05/28/23 126/82    BP (!) 140/86   Pulse 76   Wt 192 lb 9.6 oz (87.4 kg)   SpO2 98%   BMI 33.06 kg/m    Physical Exam          Physical Exam Constitutional:      Appearance: Normal appearance.  Neurological:     General: No focal deficit present.     Mental Status: She is alert and oriented to person, place, and time. Mental status is at baseline.     ASSESSMENT/PLAN:   Assessment & Plan Primary hypertension    Assessment and Plan    Hypertension Blood pressure elevated, home readings up to 143/98 mmHg. On low dose  losartan  for diabetes. Target <130/80 mmHg. - Prescribe losartan /hydrochlorothiazide combination. - Monitor blood pressure twice daily for two weeks. - Report readings via MyChart or schedule appointment if needed. - Adjust medication if blood pressure remains elevated after two weeks.  Headache Tension-like headaches with pressure and sharp pains, sometimes nocturnal, associated with elevated blood pressure. Ibuprofen  ineffective. - Continue ibuprofen , alternate with acetaminophen . - Monitor headache improvement with blood pressure changes. - Report if headaches become migraine-like for potential migraine medication.         Arvie Bartholomew A. Vita MD [facility]

## 2023-12-24 ENCOUNTER — Encounter: Payer: Self-pay | Admitting: Nurse Practitioner

## 2023-12-29 ENCOUNTER — Encounter: Payer: Self-pay | Admitting: Family Medicine

## 2023-12-29 ENCOUNTER — Ambulatory Visit (INDEPENDENT_AMBULATORY_CARE_PROVIDER_SITE_OTHER): Payer: Self-pay | Admitting: Family Medicine

## 2023-12-29 VITALS — BP 124/80 | HR 95 | Wt 189.2 lb

## 2023-12-29 DIAGNOSIS — M940 Chondrocostal junction syndrome [Tietze]: Secondary | ICD-10-CM

## 2023-12-29 DIAGNOSIS — R000 Tachycardia, unspecified: Secondary | ICD-10-CM

## 2023-12-29 DIAGNOSIS — I10 Essential (primary) hypertension: Secondary | ICD-10-CM

## 2023-12-29 MED ORDER — CARVEDILOL 3.125 MG PO TABS: 3.1250 mg | ORAL_TABLET | Freq: Two times a day (BID) | ORAL | 3 refills | Status: DC

## 2023-12-29 MED ORDER — MELOXICAM 15 MG PO TABS: 15.0000 mg | ORAL_TABLET | Freq: Every day | ORAL | 0 refills | Status: DC

## 2023-12-29 MED ORDER — LOSARTAN POTASSIUM 50 MG PO TABS: 50.0000 mg | ORAL_TABLET | Freq: Every day | ORAL | 0 refills | Status: DC

## 2023-12-29 NOTE — Patient Instructions (Addendum)
 Please take your coreg  morning and evening  Please take your losartan  once a day  Please take your meloxicam  daily for the next 6-7 days

## 2023-12-29 NOTE — Progress Notes (Signed)
 Name: Ellaree Gear   Date of Visit: 12/29/23   Date of last visit with me: 12/22/2023   CHIEF COMPLAINT:  Chief Complaint  Patient presents with   Acute Visit    Left side chest discomfort, feels like pressure. Sharp pain and pressure in top of head and back of head. Plus has been really high 116. Been taking bp medication, bp has came down. 96 is the lowest her heart rate has been. Taking more breaths when doing activities.        HPI:  Discussed the use of AI scribe software for clinical note transcription with the patient, who gave verbal consent to proceed.  History of Present Illness   Kristilyn Coltrane Nohely Whitehorn is a 58 year old female with hypertension who presents with sharp head pains and chest discomfort.  She experiences sharp pains at the top of her head and a band-like sensation around her head, which have been persistent and cause significant discomfort. The head pain is described as sharp and different from her usual headaches or migraines.  She describes chest pain that is different from her usual chest wall pain. The current pain is dull and nagging, located on the left side of her chest. It is not constant, and she has not attempted to reproduce it by pressing on the area. The pain is present at the time of the visit.  Her blood pressure readings at home have been variable. On September 4th, her blood pressure was 128/83 mmHg with a heart rate of 92 bpm in the morning, and 118/72 mmHg in the afternoon after taking her medication. On September 5th, her blood pressure was 116/67 mmHg with a heart rate of 112 bpm before medication, and 107/57 mmHg with a heart rate of 100 bpm in the afternoon. She takes her blood pressure medication in the morning. She is currently taking a combination pill that includes a diuretic and losartan . Her heart rate has been elevated, ranging from 92 to 114 bpm, which she attributes to stress.  She is the primary caregiver for her  husband, who is paralyzed on one side following a stroke, and also manages responsibilities for her mother and brother, both of whom have significant health issues. 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  She is on Ozempic  for weight management and has lost three pounds since her last visit. She experiences occasional morning nausea but manages it by not eating until later. She typically eats two meals a day with occasional snacks.         OBJECTIVE:       05/28/2023    9:56 AM  Depression screen PHQ 2/9  Decreased Interest 0  Down, Depressed, Hopeless 0  PHQ - 2 Score 0     BP Readings from Last 3 Encounters:  12/29/23 124/80  12/22/23 (!) 140/86  06/17/23 124/80    BP 124/80   Pulse 95   Wt 189 lb 3.2 oz (85.8 kg)   SpO2 98%   BMI 32.48 kg/m    Physical Exam   CHEST: Chest wall tenderness on palpation of the left side.      Physical Exam  ASSESSMENT/PLAN:   Assessment & Plan Acute costochondritis  Primary hypertension  Tachycardia    Assessment and Plan    Hypertension and tachycardia Blood pressure shows variability with elevated heart rate likely due to stress. Financial constraints limit immediate cardiac evaluation. - Discontinue combination  pill of losartan  and diuretic. - Prescribe losartan  once daily. - Prescribe additional coreg  bid for blood pressure and heart rate. - Instruct to measure blood pressure and heart rate twice daily for 1-2 weeks. - Provided information on [place] financial assistance for cardiac evaluation.  Headache Sharp head pains, not typical of migraines or tension headaches. - Prescribe meloxicam  15 mg once daily with food for 5-6 days, then as needed.  Musculoskeletal chest wall pain Intermittent left chest pain reproducible with palpation, suggesting musculoskeletal origin. - Prescribe meloxicam  15 mg once daily with food for 5-6 days, then as  needed.  Obesity Weight loss of 3 pounds with current Ozempic  1 mg. Mild nausea present. Encouraged to continue dietary habits. - Continue Ozempic  1 mg. - Monitor weight and dietary habits. Consider increasing Ozempic  dose if weight loss plateaus.         Hilja Kintzel A. Vita MD [facility]

## 2024-01-11 ENCOUNTER — Telehealth: Payer: Self-pay

## 2024-01-11 NOTE — Telephone Encounter (Signed)
 Received reorder form from [employer] on (Ozempic ), fill and faxed to provider office to sign and date, can be faxed to [employer] or fax back to [phone].

## 2024-01-13 NOTE — Telephone Encounter (Signed)
 Received provider portion back from provider office faxed to [employer] today.

## 2024-01-14 ENCOUNTER — Ambulatory Visit (INDEPENDENT_AMBULATORY_CARE_PROVIDER_SITE_OTHER): Payer: Self-pay | Admitting: Nurse Practitioner

## 2024-01-14 ENCOUNTER — Encounter: Payer: Self-pay | Admitting: Nurse Practitioner

## 2024-01-14 VITALS — BP 132/82 | HR 78 | Ht 63.5 in | Wt 191.4 lb

## 2024-01-14 DIAGNOSIS — E1165 Type 2 diabetes mellitus with hyperglycemia: Secondary | ICD-10-CM

## 2024-01-14 DIAGNOSIS — M940 Chondrocostal junction syndrome [Tietze]: Secondary | ICD-10-CM

## 2024-01-14 DIAGNOSIS — I10 Essential (primary) hypertension: Secondary | ICD-10-CM

## 2024-01-14 MED ORDER — LOSARTAN POTASSIUM 50 MG PO TABS: 50.0000 mg | ORAL_TABLET | Freq: Every day | ORAL | 1 refills | Status: AC

## 2024-01-14 MED ORDER — MELOXICAM 15 MG PO TABS: 15.0000 mg | ORAL_TABLET | Freq: Every day | ORAL | 3 refills | Status: AC

## 2024-01-14 NOTE — Progress Notes (Signed)
 Catheline Doing, DNP, AGNP-c [facility]  [address] [place], [place] [address] [phone]  ESTABLISHED PATIENT- Chronic Health and/or Follow-Up Visit on 01/14/2024  Blood pressure 132/82, pulse 78, height 5' 3.5 (1.613 m), weight 191 lb 6.4 oz (86.8 kg).   HPI: History of Present Illness [name] is a 58 year old female with hypertension and diabetes who presents for a six-month medication check follow-up.  She was initially scheduled for a physical exam but clarified that she is here for a six-month medication check follow-up. Her heart rate and blood pressure were elevated, prompting her visit. She previously took medication for chest pain for six days, which initially helped, but the chest pain has returned.  She is currently taking carvedilol , which she finds helpful for her chest pain, carpal tunnel, hand pain, and knee pain, especially as the weather cools. She did not take her medication this morning as it requires food, and she typically does not eat in the morning. She has been monitoring her blood pressure, which has improved since she started working again. She attributes previous high blood pressure to stress from caring for her husband, who had a stroke.  She has not been able to check her blood sugar due to a dead battery in her glucometer. She is on Ozempic  through a patient assistance program and reports no increased hunger, thirst, or urination. Her last A1c was 6.6. She has experienced some weight gain, from 180 to 191 pounds.  She is also on losartan , which was increased from 25 mg to 50 mg, taken at night. She has been taking two 25 mg tablets due to a prescription issue. She prefers 90-day prescriptions for her medications. She has been using [facility] for her prescriptions due to lack of insurance.  She mentions experiencing chest wall pain, usually on the left side, along with sharp head pains and pressure across her forehead and the  back of her head. She finds meloxicam  helpful for these symptoms. She reports occasional extra heartbeats but not frequently.  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 She finds being active and busy beneficial for her well-being.  All ROS negative with exception of what is listed above.    PHYSICAL EXAM Physical Exam Vitals and nursing note reviewed.  Constitutional:      Appearance: Normal appearance.  HENT:     Head: Normocephalic.  Eyes:     Pupils: Pupils are equal, round, and reactive to light.  Cardiovascular:     Rate and Rhythm: Normal rate and regular rhythm.     Pulses: Normal pulses.     Heart sounds: Normal heart sounds.  Pulmonary:     Effort: Pulmonary effort is normal.     Breath sounds: Normal breath sounds.  Musculoskeletal:        General: Normal range of motion.     Cervical back: Normal range of motion.  Skin:    General: Skin is warm.  Neurological:     General: No focal deficit present.     Mental Status: She is alert and oriented to person, place, and time.  Psychiatric:        Mood and Affect: Mood normal.     PLAN Problem List Items Addressed This Visit     Type 2 diabetes mellitus with hyperglycemia, without long-term current use of  insulin (HCC) - Primary   Type 2 diabetes mellitus with good control, A1c is 6.6. No increased hunger, thirst, or urination. Tolerating Ozempic  well without adverse effects. Recent weight gain noted, but she is increasing physical activity with new jobs. - Continue Ozempic  1 mg. Consider increasing to 2 mg if weight gain persists after increased activity. - Monitor A1c levels regularly. - Encourage increased physical activity with new job roles.       Relevant Medications   losartan  (COZAAR ) 50 MG tablet   Primary  hypertension   Hypertension with recent increase in blood pressure, likely related to stress. Blood pressure improving with lifestyle changes and medication adjustments.  - Continue losartan  50 mg at night. - Monitor blood pressure regularly. If blood pressure exceeds 140/85, contact provider.      Relevant Medications   losartan  (COZAAR ) 50 MG tablet   Acute costochondritis   Intermittent chest wall pain, previously treated with carvedilol . Meloxicam  has been effective in managing symptoms. - Continue meloxicam  as needed for pain management. - Use carvedilol  as needed for chest pressure or palpitations.      Relevant Medications   meloxicam  (MOBIC ) 15 MG tablet     Return in about 6 months (around 07/13/2024) for CPE.  SaraBeth Kennley Schwandt, DNP, AGNP-c Time: 32 minutes, >50% spent counseling, care coordination, chart review, and documentation.  SABRAtime

## 2024-01-14 NOTE — Patient Instructions (Signed)
 Continue with the meloxicam  for pain.  You can stop the carvedilol  as a twice a day medication, but instead, use it as needed up to 2 times a day for pressure in the chest or a feeling like your heart is skipping beats or beating rapidly.   If you notice your blood pressures at home are staying higher than 140/85, please let me know.

## 2024-01-18 DIAGNOSIS — I10 Essential (primary) hypertension: Secondary | ICD-10-CM | POA: Insufficient documentation

## 2024-01-18 DIAGNOSIS — M940 Chondrocostal junction syndrome [Tietze]: Secondary | ICD-10-CM | POA: Insufficient documentation

## 2024-01-18 NOTE — Assessment & Plan Note (Signed)
 Type 2 diabetes mellitus with good control, A1c is 6.6. No increased hunger, thirst, or urination. Tolerating Ozempic  well without adverse effects. Recent weight gain noted, but she is increasing physical activity with new jobs. - Continue Ozempic  1 mg. Consider increasing to 2 mg if weight gain persists after increased activity. - Monitor A1c levels regularly. - Encourage increased physical activity with new job roles.

## 2024-01-18 NOTE — Assessment & Plan Note (Signed)
 Intermittent chest wall pain, previously treated with carvedilol . Meloxicam  has been effective in managing symptoms. - Continue meloxicam  as needed for pain management. - Use carvedilol  as needed for chest pressure or palpitations.

## 2024-01-18 NOTE — Assessment & Plan Note (Signed)
 Hypertension with recent increase in blood pressure, likely related to stress. Blood pressure improving with lifestyle changes and medication adjustments.  - Continue losartan  50 mg at night. - Monitor blood pressure regularly. If blood pressure exceeds 140/85, contact provider.

## 2024-01-26 ENCOUNTER — Encounter: Payer: Self-pay | Admitting: Nurse Practitioner

## 2024-01-31 NOTE — Telephone Encounter (Signed)
 PAP Ozempic received, left message for pt

## 2024-03-24 ENCOUNTER — Encounter: Payer: Self-pay | Admitting: Nurse Practitioner

## 2024-03-25 ENCOUNTER — Other Ambulatory Visit: Payer: Self-pay | Admitting: Family Medicine

## 2024-03-25 DIAGNOSIS — R000 Tachycardia, unspecified: Secondary | ICD-10-CM

## 2024-03-25 DIAGNOSIS — I10 Essential (primary) hypertension: Secondary | ICD-10-CM

## 2024-03-30 ENCOUNTER — Encounter: Payer: Self-pay | Admitting: Nurse Practitioner

## 2024-04-05 ENCOUNTER — Encounter: Payer: Self-pay | Admitting: Nurse Practitioner

## 2024-04-05 ENCOUNTER — Ambulatory Visit: Payer: Self-pay | Admitting: Nurse Practitioner

## 2024-04-05 VITALS — BP 128/84 | HR 84 | Wt 187.2 lb

## 2024-04-05 DIAGNOSIS — M797 Fibromyalgia: Secondary | ICD-10-CM

## 2024-04-05 MED ORDER — CYCLOBENZAPRINE HCL 5 MG PO TABS: ORAL_TABLET | ORAL | 1 refills | Status: DC

## 2024-04-05 MED ORDER — PREGABALIN 25 MG PO CAPS: ORAL_CAPSULE | ORAL | 0 refills | Status: DC

## 2024-04-05 NOTE — Patient Instructions (Addendum)
 Let me know if this is working and I will send refills. If not, we can always increase.   Myofascial Pain Syndrome and Fibromyalgia Myofascial pain syndrome and fibromyalgia are both pain disorders. You may feel this pain mainly in your muscles. Myofascial pain syndrome: Always has tender points in the muscles that will cause pain when pressed (trigger points). The pain may come and go. Usually affects your neck, upper back, and shoulder areas. The pain often moves into your arms and hands. Fibromyalgia: Has muscle pains and tenderness that come and go. Is often associated with tiredness (fatigue) and sleep problems. Has trigger points. Tends to be long-lasting (chronic), but is not life-threatening. Fibromyalgia and myofascial pain syndrome are not the same. However, they often occur together. If you have both conditions, each can make the other worse. Both are common and can cause enough pain and fatigue to make day-to-day activities difficult. Both can be hard to diagnose because their symptoms are common in many other conditions. What are the causes? The exact causes of these conditions are not known. What increases the risk? You are more likely to develop either of these conditions if: You have a family history of the condition. You are female. You have certain triggers, such as: Spine disorders. An injury (trauma) or other physical stressors. Being under a lot of stress. Medical conditions such as osteoarthritis, rheumatoid arthritis, or lupus. What are the signs or symptoms? Fibromyalgia The main symptom of fibromyalgia is widespread pain and tenderness in your muscles. Pain is sometimes described as stabbing, shooting, or burning. You may also have: Tingling or numbness. Sleep problems and fatigue. Problems with attention and concentration (fibro fog). Other symptoms may include: Bowel and bladder problems. Headaches. Vision problems. Sensitivity to odors and  noises. Depression or mood changes. Painful menstrual periods (dysmenorrhea). Dry skin or eyes. These symptoms can vary over time. Myofascial pain syndrome Symptoms of myofascial pain syndrome include: Tight, ropy bands of muscle. Uncomfortable sensations in muscle areas. These may include aching, cramping, burning, numbness, tingling, and weakness. Difficulty moving certain parts of the body freely (poor range of motion). How is this diagnosed? This condition may be diagnosed by your symptoms and medical history. You will also have a physical exam. In general: Fibromyalgia is diagnosed if you have pain, fatigue, and other symptoms for more than 3 months, and symptoms cannot be explained by another condition. Myofascial pain syndrome is diagnosed if you have trigger points in your muscles, and those trigger points are tender and cause pain elsewhere in your body (referred pain). How is this treated? Treatment for these conditions depends on the type that you have. For fibromyalgia, a healthy lifestyle is the most important treatment including aerobic and strength exercises. Different types of medicines are used to help treat pain and include: NSAIDs. Medicines for treating depression. Medicines that help control seizures. Medicines that relax the muscles. Treatment for myofascial pain syndrome includes: Pain medicines, such as NSAIDs. Cooling and stretching of muscles. Massage therapy with myofascial release technique. Trigger point injections. Treating these conditions often requires a team of health care providers. These may include: Your primary care provider. A physical therapist. Complementary health care providers, such as massage therapists or acupuncturists. A psychiatrist for cognitive behavioral therapy. Follow these instructions at home: Medicines Take over-the-counter and prescription medicines only as told by your health care provider. Ask your health care provider if  the medicine prescribed to you: Requires you to avoid driving or using machinery. Can cause constipation.  You may need to take these actions to prevent or treat constipation: Drink enough fluid to keep your urine pale yellow. Take over-the-counter or prescription medicines. Eat foods that are high in fiber, such as beans, whole grains, and fresh fruits and vegetables. Limit foods that are high in fat and processed sugars, such as fried or sweet foods. Lifestyle  Do exercises as told by your health care provider or physical therapist. Practice relaxation techniques to control your stress. You may want to try: Biofeedback. Visual imagery. Hypnosis. Muscle relaxation. Yoga. Meditation. Maintain a healthy lifestyle. This includes eating a healthy diet and getting enough sleep. Do not use any products that contain nicotine or tobacco. These products include cigarettes, chewing tobacco, and vaping devices, such as e-cigarettes. If you need help quitting, ask your health care provider. General instructions Talk to your health care provider about complementary treatments, such as acupuncture or massage. Do not do activities that stress or strain your muscles. This includes repetitive motions and heavy lifting. Keep all follow-up visits. This is important. Where to find support Consider joining a support group with others who are diagnosed with this condition. National Fibromyalgia Association: fmaware.org Where to find more information U.S. Pain Foundation: uspainfoundation.org Contact a health care provider if: You have new symptoms. Your symptoms get worse or your pain is severe. You have side effects from your medicines. You have trouble sleeping. Your condition is causing depression or anxiety. Get help right away if: You have thoughts of hurting yourself or others. Get help right away if you feel like you may hurt yourself or others, or have thoughts about taking your own life. Go to  your nearest emergency room or: Call 911. Call the National Suicide Prevention Lifeline at (740)285-3289 or 988. This is open 24 hours a day. Text the Crisis Text Line at 563-384-4028. This information is not intended to replace advice given to you by your health care provider. Make sure you discuss any questions you have with your health care provider. Document Revised: 01/12/2022 Document Reviewed: 03/07/2021 Elsevier Patient Education  2024 [employer].

## 2024-04-05 NOTE — Progress Notes (Signed)
 Catheline Doing, DNP, AGNP-c [facility]  [address] [place], [place] [address] [phone]   ESTABLISHED PATIENT- Chronic Health and/or Follow-Up Visit on 04/05/2024  Blood pressure 128/84, pulse 84, weight 187 lb 3.2 oz (84.9 kg).   Subjective:  other (Gabapentin  not working make her go to the bathroom not helping with fibro, and would like a freestyle libre, gabapentin  not in med list) History of Present Illness [name] Mindel Friscia is a 58 year old female with fibromyalgia who presents with worsening pain.  She takes gabapentin , which helps with carpal tunnel and hand pain but not with fibromyalgia pain. She is on Cymbalta  and has tried numerous other treatment options without success. She has a history of pain management, using morphine, which was effective but discontinued due to supply issues and withdrawal symptoms.   Her pain is widespread, severely affecting the right side and now involving her entire body, including her back, side, and joints. Movement exacerbates the pain, while stillness and warmth provides relief. She has a history of sciatica and a deteriorating back, for which she has received joint injections, though not specifically for trigger points.  She has tried various medications over the years, including Lyrica , which she is willing to try again. Muscle relaxers like cyclobenzaprine  and methocarbamol have provided temporary relief in the past, but wear off. She notes a high tolerance to medications, mentioning that even high doses of morphine did not induce sleepiness.  Cold weather exacerbates her symptoms, leading to increased pain during the winter months.   ROS negative except for what is listed in HPI. History, Medications, Surgery, SDOH, and Family History reviewed and updated as appropriate.  Objective:  Physical Exam Vitals and nursing note reviewed.  Constitutional:      Appearance: Normal appearance.  HENT:     Head:  Normocephalic.  Eyes:     Pupils: Pupils are equal, round, and reactive to light.  Musculoskeletal:        General: Tenderness present.     Cervical back: Tenderness present.     Right lower leg: No edema.     Left lower leg: No edema.  Skin:    General: Skin is warm and dry.  Neurological:     General: No focal deficit present.     Mental Status: She is alert and oriented to person, place, and time.     Motor: Weakness present.     Gait: Gait abnormal.  Psychiatric:        Mood and Affect: Mood normal.         Assessment & Plan:   Assessment & Plan Fibromyalgia Chronic fibromyalgia with widespread pain, exacerbated by movement and weather changes. Previous treatments including gabapentin , Cymbalta , muscle relaxer, and morphine were ineffective or had intolerable side effects. Considering Lyrica  and cyclobenzaprine  for pain management. Discussed potential side effects of medications, including drowsiness and dizziness. Lyrica  and duloxetine  combination may be effective for fibromyalgia pain. Will add PRN cyclobenzaprine  as adjunctive medication while Lyrica  is starting.  - Prescribed Lyrica  with a tapering schedule: start with one capsule in the morning and one in the evening for three days, then increase to two capsules in the morning and two in the evening. - Prescribed cyclobenzaprine  to manage pain while starting Lyrica . - Advised to monitor for drowsiness and adjust timing of doses if needed. - Instructed to report if no improvement in symptoms. Orders:   pregabalin  (LYRICA ) 25 MG capsule; Take 1 tablet by mouth twice a day for  3 days then increase to 2 tablets by mouth daily.   cyclobenzaprine  (FLEXERIL ) 5 MG tablet; PRN up to once a day for muscle pain.    Chrsitopher Wik E Ephraim Reichel, DNP, AGNP-c

## 2024-04-05 NOTE — Assessment & Plan Note (Signed)
 Chronic fibromyalgia with widespread pain, exacerbated by movement and weather changes. Previous treatments including gabapentin , Cymbalta , muscle relaxer, and morphine were ineffective or had intolerable side effects. Considering Lyrica  and cyclobenzaprine  for pain management. Discussed potential side effects of medications, including drowsiness and dizziness. Lyrica  and duloxetine  combination may be effective for fibromyalgia pain. Will add PRN cyclobenzaprine  as adjunctive medication while Lyrica  is starting.  - Prescribed Lyrica  with a tapering schedule: start with one capsule in the morning and one in the evening for three days, then increase to two capsules in the morning and two in the evening. - Prescribed cyclobenzaprine  to manage pain while starting Lyrica . - Advised to monitor for drowsiness and adjust timing of doses if needed. - Instructed to report if no improvement in symptoms. Orders:   pregabalin  (LYRICA ) 25 MG capsule; Take 1 tablet by mouth twice a day for 3 days then increase to 2 tablets by mouth daily.   cyclobenzaprine  (FLEXERIL ) 5 MG tablet; PRN up to once a day for muscle pain.

## 2024-04-10 ENCOUNTER — Encounter: Payer: Self-pay | Admitting: Nurse Practitioner

## 2024-04-18 ENCOUNTER — Encounter: Payer: Self-pay | Admitting: Nurse Practitioner

## 2024-04-18 NOTE — Telephone Encounter (Signed)
"  Appt scheduled  "

## 2024-04-19 ENCOUNTER — Ambulatory Visit: Payer: Self-pay | Admitting: Nurse Practitioner

## 2024-04-19 ENCOUNTER — Encounter: Payer: Self-pay | Admitting: Nurse Practitioner

## 2024-04-19 VITALS — BP 122/80 | HR 72 | Wt 184.2 lb

## 2024-04-19 DIAGNOSIS — R059 Cough, unspecified: Secondary | ICD-10-CM

## 2024-04-19 DIAGNOSIS — J189 Pneumonia, unspecified organism: Secondary | ICD-10-CM

## 2024-04-19 MED ORDER — PROMETHAZINE-DM 6.25-15 MG/5ML PO SYRP: 5.0000 mL | ORAL_SOLUTION | Freq: Four times a day (QID) | ORAL | 0 refills | Status: AC | PRN

## 2024-04-19 MED ORDER — AZITHROMYCIN 250 MG PO TABS: ORAL_TABLET | ORAL | 0 refills | Status: AC

## 2024-04-19 MED ORDER — PREDNISONE 20 MG PO TABS: 20.0000 mg | ORAL_TABLET | Freq: Every day | ORAL | 0 refills | Status: AC

## 2024-04-19 NOTE — Patient Instructions (Signed)
 Community-Acquired Pneumonia, Adult Pneumonia is a lung infection that causes inflammation and the buildup of mucus and fluids in the lungs. This may cause coughing and difficulty breathing. Community-acquired pneumonia is pneumonia that develops in people who are not, and have not recently been, in a hospital or other health care facility. Usually, pneumonia develops as a result of an illness that is caused by a virus, such as the common cold and the flu (influenza). It can also be caused by bacteria or fungi. While the common cold and influenza can pass from person to person (are contagious), pneumonia itself is not considered contagious. What are the causes? This condition may be caused by: Viruses. Bacteria. Fungi. What increases the risk? The following factors may make you more likely to develop this condition: Being over age 40 or having certain medical conditions, such as: A long-term (chronic) disease, such as: chronic obstructive pulmonary disease (COPD), asthma, heart failure, diabetes, or kidney disease. A condition that increases the risk of breathing in (aspirating) mucus and other fluids from your mouth and nose. A weakened body defense system (immune system). Having had your spleen removed (splenectomy). The spleen is the organ that helps fight germs and infections. Not cleaning your teeth and gums well (poor dental hygiene). Using tobacco products. Traveling to places where germs that cause pneumonia are present or being near certain animals or animal habitats that could have germs that cause pneumonia. What are the signs or symptoms? Symptoms of this condition include: A dry cough or a wet (productive) cough. A fever, sweating, or chills. Chest pain, especially when breathing deeply or coughing. Fast breathing, difficulty breathing, or shortness of breath. Tiredness (fatigue) and muscle aches. How is this diagnosed? This condition may be diagnosed based on your medical  history or a physical exam. You may also have tests, including: Imaging, such as a chest X-ray or lung ultrasound. Tests of: The level of oxygen and other gases in your blood. Mucus from your lungs (sputum). Fluid around your lungs (pleural fluid). Your urine. How is this treated? Treatment for this condition depends on many factors, such as the cause of your pneumonia, your medicines, and other medical conditions that you have. For most adults, pneumonia may be treated at home. In some cases, treatment must happen in a hospital and may include: Medicines that are given by mouth (orally) or through an IV, including: Antibiotic medicines, if bacteria caused the pneumonia. Medicines that kill viruses (antiviral medicines), if a virus caused the pneumonia. Oxygen therapy. Severe pneumonia, although rare, may require the following treatments: Mechanical ventilation.This procedure uses a machine to help you breathe if you cannot breathe well on your own or maintain a safe level of blood oxygen. Thoracentesis. This procedure removes any buildup of pleural fluid to help with breathing. Follow these instructions at home:  Medicines Take over-the-counter and prescription medicines only as told by your health care provider. Take cough medicine only if you have trouble sleeping. Cough medicine can prevent your body from removing mucus from your lungs. If you were prescribed antibiotics, take them as told by your health care provider. Do not stop taking the antibiotic even if you start to feel better. Lifestyle     Do not drink alcohol. Do not use any products that contain nicotine or tobacco. These products include cigarettes, chewing tobacco, and vaping devices, such as e-cigarettes. If you need help quitting, ask your health care provider. Eat a healthy diet. This includes plenty of vegetables, fruits, whole grains, low-fat  dairy products, and lean protein. General instructions Rest a lot and  get at least 8 hours of sleep each night. Sleep in a partly upright position at night. Place a few pillows under your head or sleep in a reclining chair. Return to your normal activities as told by your health care provider. Ask your health care provider what activities are safe for you. Drink enough fluid to keep your urine pale yellow. This helps to thin the mucus in your lungs. If your throat is sore, gargle with a mixture of salt and water 3-4 times a day or as needed. To make salt water, completely dissolve -1 tsp (3-6 g) of salt in 1 cup (237 mL) of warm water. Keep all follow-up visits. How is this prevented? You can lower your risk of developing community-acquired pneumonia by: Getting the pneumonia vaccine. There are different types and schedules of pneumonia vaccines. Ask your health care provider which option is best for you. Consider getting the pneumonia vaccine if: You are older than 60 years of age. You are 67-15 years of age and are receiving cancer treatment, have chronic lung disease, or have other medical conditions that affect your immune system. Ask your health care provider if this applies to you. Getting your influenza vaccine every year. Ask your health care provider which type of vaccine is best for you. Getting regular dental checkups. Washing your hands often with soap and water for at least 20 seconds. If soap and water are not available, use hand sanitizer. Contact a health care provider if: You have a fever. You have trouble sleeping because you cannot control your cough with cough medicine. Get help right away if: Your shortness of breath becomes worse. Your chest pain increases. Your sickness becomes worse, especially if you are an older adult or have a weak immune system. You cough up blood. These symptoms may be an emergency. Get help right away. Call 911. Do not wait to see if the symptoms will go away. Do not drive yourself to the  hospital. Summary Pneumonia is an infection of the lungs. Community-acquired pneumonia develops in people who have not been in the hospital. It can be caused by bacteria, viruses, or fungi. This condition may be treated with antibiotics or antiviral medicines. Severe pneumonia may require a hospital stay and treatment to help with breathing. This information is not intended to replace advice given to you by your health care provider. Make sure you discuss any questions you have with your health care provider. Document Revised: 03/10/2023 Document Reviewed: 06/04/2021 Elsevier Patient Education  2025 [employer].

## 2024-04-19 NOTE — Progress Notes (Signed)
 "  Camie FORBES Doing, DNP, AGNP-c [facility] [address] [place], [place] [address] [phone]   ACUTE VISIT : Acute or New Concern Visit on 04/19/2024  Blood pressure 122/80, pulse 72, weight 184 lb 3.2 oz (83.6 kg), SpO2 98%.   Subjective:  other (Coughing, blowing nose, and tired, body aches earlier, started around 21st, no fever, had diarrhea when eating or drinking anything still has that today, no ST, )  History of Present Illness [name] is a 58 year old female who presents with cough, body aches, and low-grade fever.  Symptoms began on April 09, 2024, with body aches and a low-grade fever around 100.54F. She developed a persistent non-productive cough and sinus pressure. Chills and shortness of breath occur, particularly with movement, and symptoms worsen with heat exposure.  She experiences difficulty eating and drinking due to her symptoms and reports dizziness. Her cough worsens with talking, and she has been unable to work, expressing concern about returning to work soon.  She has a known allergy to amoxicillin, which causes itching and diarrhea, and to penicillins, which cause her tongue to swell. She is also allergic to doxycycline, but is unsure of the specific reaction. She has not experienced a yeast infection recently, although she had them frequently in the past when taking antibiotics.  She is currently taking Tylenol  and ibuprofen  for symptom relief.  ROS negative except for what is listed in HPI. History, Medications, Surgery, SDOH, and Family History reviewed and updated as appropriate.  Objective:  Physical Exam Vitals and nursing note reviewed.  Constitutional:      Appearance: She is ill-appearing.  HENT:     Head: Normocephalic.     Mouth/Throat:     Comments: Mask in place Eyes:     Pupils: Pupils are equal, round, and reactive to light.  Neck:     Vascular: No carotid bruit.  Cardiovascular:     Rate and  Rhythm: Normal rate and regular rhythm.     Pulses: Normal pulses.     Heart sounds: Normal heart sounds.  Pulmonary:     Effort: Pulmonary effort is normal.     Breath sounds: Wheezing, rhonchi and rales present.  Musculoskeletal:     Cervical back: Normal range of motion.  Lymphadenopathy:     Cervical: Cervical adenopathy present.  Skin:    General: Skin is warm.  Neurological:     General: No focal deficit present.     Mental Status: She is alert and oriented to person, place, and time.         Assessment & Plan:   Assessment & Plan Community acquired pneumonia, unspecified laterality Cough, unspecified type  Suspected community acquired pneumonia, likely viral onset with influenza, presenting with cough, body aches, low-grade fever, chills, sinus pressure, and shortness of breath. Lungs sound gunky, consistent with pneumonia. Symptoms have persisted for several days, suggesting a possible secondary bacterial infection. Allergic to amoxicillin, doxycycline, and penicillins. Azithromycin chosen due to its efficacy in treating this type of pneumonia. Prednisone prescribed to reduce inflammation, with caution regarding potential blood sugar elevation. Inhaler provided to reduce lung inflammation and open airways. Mucinex samples provided to aid in mucus clearance. Cough syrup with promethazine and dextromethorphan prescribed to suppress cough and manage nausea. Advised on rest, hydration, and monitoring for yeast infection due to antibiotic use. - Prescribed azithromycin for bacterial coverage. - Prescribed prednisone for 5 days to reduce inflammation. - Provided inhaler sample for daily use to reduce  lung inflammation and open airways. - Provided Mucinex samples to aid in mucus clearance. - Prescribed cough syrup with promethazine and dextromethorphan to suppress cough and manage nausea. - Advised rest, hydration, and use of Tylenol  or ibuprofen  for symptom relief. - Provided work  note for absence until January 7th. Orders:   POC COVID-19   Influenza A/B   azithromycin (ZITHROMAX) 250 MG tablet; Take 2 tablets on day 1, then 1 tablet daily on days 2 through 5   promethazine-dextromethorphan (PROMETHAZINE-DM) 6.25-15 MG/5ML syrup; Take 5 mLs by mouth 4 (four) times daily as needed for cough.   predniSONE (DELTASONE) 20 MG tablet; Take 1 tablet (20 mg total) by mouth daily with breakfast.    Camie FORBES Doing, DNP, AGNP-c       "

## 2024-04-20 ENCOUNTER — Encounter: Payer: Self-pay | Admitting: Nurse Practitioner

## 2024-04-24 ENCOUNTER — Encounter: Payer: Self-pay | Admitting: Nurse Practitioner

## 2024-04-29 ENCOUNTER — Encounter: Payer: Self-pay | Admitting: Nurse Practitioner

## 2024-05-01 ENCOUNTER — Other Ambulatory Visit: Payer: Self-pay

## 2024-05-01 MED ORDER — ALBUTEROL SULFATE HFA 108 (90 BASE) MCG/ACT IN AERS: 2.0000 | INHALATION_SPRAY | Freq: Four times a day (QID) | RESPIRATORY_TRACT | 2 refills | Status: AC | PRN

## 2024-05-11 ENCOUNTER — Telehealth: Payer: Self-pay

## 2024-05-11 NOTE — Telephone Encounter (Signed)
 Gave pt a call to let her know,she will be receiving PAP Novo Nordisk (Ozempic ) in the mail, left HIPAA VM. Faxed provider portion.

## 2024-07-19 ENCOUNTER — Encounter: Payer: Self-pay | Admitting: Nurse Practitioner
